# Patient Record
Sex: Female | Born: 1950
Health system: Southern US, Community
[De-identification: ages and names within clinical notes are randomized; demographics above are authoritative.]

## PROBLEM LIST (undated history)

## (undated) DIAGNOSIS — J449 Chronic obstructive pulmonary disease, unspecified: Secondary | ICD-10-CM

## (undated) DIAGNOSIS — I639 Cerebral infarction, unspecified: Secondary | ICD-10-CM

## (undated) DIAGNOSIS — M1990 Unspecified osteoarthritis, unspecified site: Secondary | ICD-10-CM

## (undated) DIAGNOSIS — C801 Malignant (primary) neoplasm, unspecified: Secondary | ICD-10-CM

## (undated) DIAGNOSIS — I729 Aneurysm of unspecified site: Secondary | ICD-10-CM

## (undated) DIAGNOSIS — E785 Hyperlipidemia, unspecified: Secondary | ICD-10-CM

## (undated) DIAGNOSIS — I10 Essential (primary) hypertension: Secondary | ICD-10-CM

## (undated) DIAGNOSIS — I509 Heart failure, unspecified: Secondary | ICD-10-CM

## (undated) DIAGNOSIS — F419 Anxiety disorder, unspecified: Secondary | ICD-10-CM

## (undated) DIAGNOSIS — E079 Disorder of thyroid, unspecified: Secondary | ICD-10-CM

## (undated) HISTORY — DX: Cerebral infarction, unspecified: I63.9

## (undated) HISTORY — PX: CHOLECYSTECTOMY: SHX55

## (undated) HISTORY — PX: AORTIC/RENAL BYPASS: SHX6299

## (undated) HISTORY — PX: OTHER SURGICAL HISTORY: SHX169

## (undated) HISTORY — DX: Anxiety disorder, unspecified: F41.9

## (undated) HISTORY — DX: Disorder of thyroid, unspecified: E07.9

## (undated) HISTORY — PX: CORONARY ARTERY BYPASS GRAFT: SHX141

## (undated) HISTORY — DX: Unspecified osteoarthritis, unspecified site: M19.90

## (undated) HISTORY — PX: ABDOMINAL HYSTERECTOMY: SHX81

## (undated) HISTORY — DX: Chronic obstructive pulmonary disease, unspecified: J44.9

## (undated) HISTORY — DX: Heart failure, unspecified: I50.9

## (undated) HISTORY — DX: Essential (primary) hypertension: I10

## (undated) HISTORY — DX: Aneurysm of unspecified site: I72.9

## (undated) HISTORY — DX: Hyperlipidemia, unspecified: E78.5

## (undated) HISTORY — DX: Malignant (primary) neoplasm, unspecified: C80.1

---

## 2004-06-19 ENCOUNTER — Emergency Department (HOSPITAL_COMMUNITY): Admission: EM | Admit: 2004-06-19 | Discharge: 2004-06-19 | Payer: Self-pay | Admitting: Emergency Medicine

## 2005-12-20 ENCOUNTER — Inpatient Hospital Stay (HOSPITAL_COMMUNITY): Admission: EM | Admit: 2005-12-20 | Discharge: 2006-01-21 | Payer: Self-pay | Admitting: Emergency Medicine

## 2005-12-20 ENCOUNTER — Ambulatory Visit: Payer: Self-pay | Admitting: Pulmonary Disease

## 2005-12-22 ENCOUNTER — Ambulatory Visit: Payer: Self-pay | Admitting: Infectious Diseases

## 2006-01-13 ENCOUNTER — Ambulatory Visit: Payer: Self-pay | Admitting: Physical Medicine & Rehabilitation

## 2006-01-21 ENCOUNTER — Inpatient Hospital Stay (HOSPITAL_COMMUNITY)
Admission: AD | Admit: 2006-01-21 | Discharge: 2006-02-08 | Payer: Self-pay | Admitting: Physical Medicine & Rehabilitation

## 2006-04-20 ENCOUNTER — Ambulatory Visit: Payer: Self-pay | Admitting: Physical Medicine & Rehabilitation

## 2006-04-20 ENCOUNTER — Encounter
Admission: RE | Admit: 2006-04-20 | Discharge: 2006-07-19 | Payer: Self-pay | Admitting: Physical Medicine & Rehabilitation

## 2006-05-31 ENCOUNTER — Ambulatory Visit: Payer: Self-pay | Admitting: Physical Medicine & Rehabilitation

## 2011-02-26 DIAGNOSIS — I6350 Cerebral infarction due to unspecified occlusion or stenosis of unspecified cerebral artery: Secondary | ICD-10-CM | POA: Diagnosis not present

## 2011-03-23 DIAGNOSIS — I6789 Other cerebrovascular disease: Secondary | ICD-10-CM | POA: Diagnosis not present

## 2011-03-23 DIAGNOSIS — R5383 Other fatigue: Secondary | ICD-10-CM | POA: Diagnosis not present

## 2011-03-23 DIAGNOSIS — J449 Chronic obstructive pulmonary disease, unspecified: Secondary | ICD-10-CM | POA: Diagnosis not present

## 2011-03-23 DIAGNOSIS — E7800 Pure hypercholesterolemia, unspecified: Secondary | ICD-10-CM | POA: Diagnosis not present

## 2011-03-23 DIAGNOSIS — I6350 Cerebral infarction due to unspecified occlusion or stenosis of unspecified cerebral artery: Secondary | ICD-10-CM | POA: Diagnosis not present

## 2011-03-23 DIAGNOSIS — E039 Hypothyroidism, unspecified: Secondary | ICD-10-CM | POA: Diagnosis not present

## 2011-03-23 DIAGNOSIS — R5381 Other malaise: Secondary | ICD-10-CM | POA: Diagnosis not present

## 2011-04-30 DIAGNOSIS — I6350 Cerebral infarction due to unspecified occlusion or stenosis of unspecified cerebral artery: Secondary | ICD-10-CM | POA: Diagnosis not present

## 2011-05-28 DIAGNOSIS — I6350 Cerebral infarction due to unspecified occlusion or stenosis of unspecified cerebral artery: Secondary | ICD-10-CM | POA: Diagnosis not present

## 2011-07-02 DIAGNOSIS — I6350 Cerebral infarction due to unspecified occlusion or stenosis of unspecified cerebral artery: Secondary | ICD-10-CM | POA: Diagnosis not present

## 2011-08-04 DIAGNOSIS — I6350 Cerebral infarction due to unspecified occlusion or stenosis of unspecified cerebral artery: Secondary | ICD-10-CM | POA: Diagnosis not present

## 2011-08-04 DIAGNOSIS — F17200 Nicotine dependence, unspecified, uncomplicated: Secondary | ICD-10-CM | POA: Diagnosis not present

## 2011-08-30 DIAGNOSIS — B351 Tinea unguium: Secondary | ICD-10-CM | POA: Diagnosis not present

## 2011-08-30 DIAGNOSIS — M79609 Pain in unspecified limb: Secondary | ICD-10-CM | POA: Diagnosis not present

## 2011-09-06 DIAGNOSIS — I2510 Atherosclerotic heart disease of native coronary artery without angina pectoris: Secondary | ICD-10-CM | POA: Diagnosis not present

## 2011-09-06 DIAGNOSIS — E559 Vitamin D deficiency, unspecified: Secondary | ICD-10-CM | POA: Diagnosis not present

## 2011-09-06 DIAGNOSIS — E7800 Pure hypercholesterolemia, unspecified: Secondary | ICD-10-CM | POA: Diagnosis not present

## 2011-09-06 DIAGNOSIS — R5381 Other malaise: Secondary | ICD-10-CM | POA: Diagnosis not present

## 2011-09-06 DIAGNOSIS — I749 Embolism and thrombosis of unspecified artery: Secondary | ICD-10-CM | POA: Diagnosis not present

## 2011-09-06 DIAGNOSIS — M810 Age-related osteoporosis without current pathological fracture: Secondary | ICD-10-CM | POA: Diagnosis not present

## 2011-09-06 DIAGNOSIS — E039 Hypothyroidism, unspecified: Secondary | ICD-10-CM | POA: Diagnosis not present

## 2011-09-06 DIAGNOSIS — R5383 Other fatigue: Secondary | ICD-10-CM | POA: Diagnosis not present

## 2011-10-20 DIAGNOSIS — J209 Acute bronchitis, unspecified: Secondary | ICD-10-CM | POA: Diagnosis not present

## 2011-10-27 DIAGNOSIS — J410 Simple chronic bronchitis: Secondary | ICD-10-CM | POA: Diagnosis not present

## 2011-10-27 DIAGNOSIS — I749 Embolism and thrombosis of unspecified artery: Secondary | ICD-10-CM | POA: Diagnosis not present

## 2011-10-27 DIAGNOSIS — Z0000 Encounter for general adult medical examination without abnormal findings: Secondary | ICD-10-CM | POA: Diagnosis not present

## 2011-10-28 DIAGNOSIS — I6789 Other cerebrovascular disease: Secondary | ICD-10-CM | POA: Diagnosis not present

## 2011-10-28 DIAGNOSIS — J449 Chronic obstructive pulmonary disease, unspecified: Secondary | ICD-10-CM | POA: Diagnosis not present

## 2011-10-29 DIAGNOSIS — I6789 Other cerebrovascular disease: Secondary | ICD-10-CM | POA: Diagnosis not present

## 2011-10-29 DIAGNOSIS — J449 Chronic obstructive pulmonary disease, unspecified: Secondary | ICD-10-CM | POA: Diagnosis not present

## 2011-11-02 DIAGNOSIS — I6789 Other cerebrovascular disease: Secondary | ICD-10-CM | POA: Diagnosis not present

## 2011-11-02 DIAGNOSIS — J449 Chronic obstructive pulmonary disease, unspecified: Secondary | ICD-10-CM | POA: Diagnosis not present

## 2011-11-03 DIAGNOSIS — J209 Acute bronchitis, unspecified: Secondary | ICD-10-CM | POA: Diagnosis not present

## 2011-11-08 DIAGNOSIS — I6789 Other cerebrovascular disease: Secondary | ICD-10-CM | POA: Diagnosis not present

## 2011-11-08 DIAGNOSIS — J449 Chronic obstructive pulmonary disease, unspecified: Secondary | ICD-10-CM | POA: Diagnosis not present

## 2011-11-12 DIAGNOSIS — J449 Chronic obstructive pulmonary disease, unspecified: Secondary | ICD-10-CM | POA: Diagnosis not present

## 2011-11-12 DIAGNOSIS — I6789 Other cerebrovascular disease: Secondary | ICD-10-CM | POA: Diagnosis not present

## 2011-11-15 DIAGNOSIS — J449 Chronic obstructive pulmonary disease, unspecified: Secondary | ICD-10-CM | POA: Diagnosis not present

## 2011-11-15 DIAGNOSIS — I6789 Other cerebrovascular disease: Secondary | ICD-10-CM | POA: Diagnosis not present

## 2011-11-18 DIAGNOSIS — J449 Chronic obstructive pulmonary disease, unspecified: Secondary | ICD-10-CM | POA: Diagnosis not present

## 2011-11-18 DIAGNOSIS — I6789 Other cerebrovascular disease: Secondary | ICD-10-CM | POA: Diagnosis not present

## 2011-11-23 DIAGNOSIS — J449 Chronic obstructive pulmonary disease, unspecified: Secondary | ICD-10-CM | POA: Diagnosis not present

## 2011-11-23 DIAGNOSIS — I6789 Other cerebrovascular disease: Secondary | ICD-10-CM | POA: Diagnosis not present

## 2011-11-24 DIAGNOSIS — I6789 Other cerebrovascular disease: Secondary | ICD-10-CM | POA: Diagnosis not present

## 2011-11-24 DIAGNOSIS — J449 Chronic obstructive pulmonary disease, unspecified: Secondary | ICD-10-CM | POA: Diagnosis not present

## 2011-11-24 DIAGNOSIS — J4489 Other specified chronic obstructive pulmonary disease: Secondary | ICD-10-CM | POA: Diagnosis not present

## 2011-11-24 DIAGNOSIS — Z23 Encounter for immunization: Secondary | ICD-10-CM | POA: Diagnosis not present

## 2011-11-24 DIAGNOSIS — I4891 Unspecified atrial fibrillation: Secondary | ICD-10-CM | POA: Diagnosis not present

## 2011-12-01 DIAGNOSIS — I6789 Other cerebrovascular disease: Secondary | ICD-10-CM | POA: Diagnosis not present

## 2011-12-01 DIAGNOSIS — J449 Chronic obstructive pulmonary disease, unspecified: Secondary | ICD-10-CM | POA: Diagnosis not present

## 2011-12-03 DIAGNOSIS — I6789 Other cerebrovascular disease: Secondary | ICD-10-CM | POA: Diagnosis not present

## 2011-12-03 DIAGNOSIS — J449 Chronic obstructive pulmonary disease, unspecified: Secondary | ICD-10-CM | POA: Diagnosis not present

## 2011-12-06 DIAGNOSIS — I6789 Other cerebrovascular disease: Secondary | ICD-10-CM | POA: Diagnosis not present

## 2011-12-06 DIAGNOSIS — J449 Chronic obstructive pulmonary disease, unspecified: Secondary | ICD-10-CM | POA: Diagnosis not present

## 2011-12-08 DIAGNOSIS — I6789 Other cerebrovascular disease: Secondary | ICD-10-CM | POA: Diagnosis not present

## 2011-12-08 DIAGNOSIS — J449 Chronic obstructive pulmonary disease, unspecified: Secondary | ICD-10-CM | POA: Diagnosis not present

## 2011-12-15 DIAGNOSIS — J449 Chronic obstructive pulmonary disease, unspecified: Secondary | ICD-10-CM | POA: Diagnosis not present

## 2011-12-15 DIAGNOSIS — I6789 Other cerebrovascular disease: Secondary | ICD-10-CM | POA: Diagnosis not present

## 2011-12-22 DIAGNOSIS — J449 Chronic obstructive pulmonary disease, unspecified: Secondary | ICD-10-CM | POA: Diagnosis not present

## 2011-12-22 DIAGNOSIS — I6789 Other cerebrovascular disease: Secondary | ICD-10-CM | POA: Diagnosis not present

## 2011-12-23 DIAGNOSIS — J449 Chronic obstructive pulmonary disease, unspecified: Secondary | ICD-10-CM | POA: Diagnosis not present

## 2011-12-23 DIAGNOSIS — I6789 Other cerebrovascular disease: Secondary | ICD-10-CM | POA: Diagnosis not present

## 2011-12-28 DIAGNOSIS — J449 Chronic obstructive pulmonary disease, unspecified: Secondary | ICD-10-CM | POA: Diagnosis not present

## 2011-12-28 DIAGNOSIS — I6789 Other cerebrovascular disease: Secondary | ICD-10-CM | POA: Diagnosis not present

## 2012-01-02 DIAGNOSIS — J449 Chronic obstructive pulmonary disease, unspecified: Secondary | ICD-10-CM | POA: Diagnosis not present

## 2012-01-05 DIAGNOSIS — J449 Chronic obstructive pulmonary disease, unspecified: Secondary | ICD-10-CM | POA: Diagnosis not present

## 2012-01-12 DIAGNOSIS — J449 Chronic obstructive pulmonary disease, unspecified: Secondary | ICD-10-CM | POA: Diagnosis not present

## 2012-01-14 DIAGNOSIS — J449 Chronic obstructive pulmonary disease, unspecified: Secondary | ICD-10-CM | POA: Diagnosis not present

## 2012-01-19 DIAGNOSIS — J449 Chronic obstructive pulmonary disease, unspecified: Secondary | ICD-10-CM | POA: Diagnosis not present

## 2012-02-02 DIAGNOSIS — J449 Chronic obstructive pulmonary disease, unspecified: Secondary | ICD-10-CM | POA: Diagnosis not present

## 2012-02-03 DIAGNOSIS — J449 Chronic obstructive pulmonary disease, unspecified: Secondary | ICD-10-CM | POA: Diagnosis not present

## 2012-02-08 DIAGNOSIS — J449 Chronic obstructive pulmonary disease, unspecified: Secondary | ICD-10-CM | POA: Diagnosis not present

## 2012-02-10 DIAGNOSIS — J449 Chronic obstructive pulmonary disease, unspecified: Secondary | ICD-10-CM | POA: Diagnosis not present

## 2012-02-17 DIAGNOSIS — J449 Chronic obstructive pulmonary disease, unspecified: Secondary | ICD-10-CM | POA: Diagnosis not present

## 2012-03-02 DIAGNOSIS — J449 Chronic obstructive pulmonary disease, unspecified: Secondary | ICD-10-CM | POA: Diagnosis not present

## 2012-03-03 DIAGNOSIS — J449 Chronic obstructive pulmonary disease, unspecified: Secondary | ICD-10-CM | POA: Diagnosis not present

## 2012-03-03 DIAGNOSIS — I6350 Cerebral infarction due to unspecified occlusion or stenosis of unspecified cerebral artery: Secondary | ICD-10-CM | POA: Diagnosis not present

## 2012-03-04 DIAGNOSIS — J449 Chronic obstructive pulmonary disease, unspecified: Secondary | ICD-10-CM | POA: Diagnosis not present

## 2012-03-08 DIAGNOSIS — J449 Chronic obstructive pulmonary disease, unspecified: Secondary | ICD-10-CM | POA: Diagnosis not present

## 2012-03-09 DIAGNOSIS — J449 Chronic obstructive pulmonary disease, unspecified: Secondary | ICD-10-CM | POA: Diagnosis not present

## 2012-03-10 DIAGNOSIS — J449 Chronic obstructive pulmonary disease, unspecified: Secondary | ICD-10-CM | POA: Diagnosis not present

## 2012-03-17 DIAGNOSIS — J449 Chronic obstructive pulmonary disease, unspecified: Secondary | ICD-10-CM | POA: Diagnosis not present

## 2012-03-22 DIAGNOSIS — J449 Chronic obstructive pulmonary disease, unspecified: Secondary | ICD-10-CM | POA: Diagnosis not present

## 2012-03-23 DIAGNOSIS — J449 Chronic obstructive pulmonary disease, unspecified: Secondary | ICD-10-CM | POA: Diagnosis not present

## 2012-03-27 DIAGNOSIS — J449 Chronic obstructive pulmonary disease, unspecified: Secondary | ICD-10-CM | POA: Diagnosis not present

## 2012-03-31 DIAGNOSIS — I6350 Cerebral infarction due to unspecified occlusion or stenosis of unspecified cerebral artery: Secondary | ICD-10-CM | POA: Diagnosis not present

## 2012-04-01 DIAGNOSIS — J449 Chronic obstructive pulmonary disease, unspecified: Secondary | ICD-10-CM | POA: Diagnosis not present

## 2012-04-10 DIAGNOSIS — J449 Chronic obstructive pulmonary disease, unspecified: Secondary | ICD-10-CM | POA: Diagnosis not present

## 2012-04-11 DIAGNOSIS — J449 Chronic obstructive pulmonary disease, unspecified: Secondary | ICD-10-CM | POA: Diagnosis not present

## 2012-04-17 DIAGNOSIS — J449 Chronic obstructive pulmonary disease, unspecified: Secondary | ICD-10-CM | POA: Diagnosis not present

## 2012-04-20 DIAGNOSIS — J449 Chronic obstructive pulmonary disease, unspecified: Secondary | ICD-10-CM | POA: Diagnosis not present

## 2012-04-21 DIAGNOSIS — J449 Chronic obstructive pulmonary disease, unspecified: Secondary | ICD-10-CM | POA: Diagnosis not present

## 2012-05-04 DIAGNOSIS — I6789 Other cerebrovascular disease: Secondary | ICD-10-CM | POA: Diagnosis not present

## 2012-05-26 DIAGNOSIS — I6350 Cerebral infarction due to unspecified occlusion or stenosis of unspecified cerebral artery: Secondary | ICD-10-CM | POA: Diagnosis not present

## 2012-07-10 DIAGNOSIS — E785 Hyperlipidemia, unspecified: Secondary | ICD-10-CM | POA: Diagnosis not present

## 2012-07-10 DIAGNOSIS — J449 Chronic obstructive pulmonary disease, unspecified: Secondary | ICD-10-CM | POA: Diagnosis not present

## 2012-07-10 DIAGNOSIS — R5383 Other fatigue: Secondary | ICD-10-CM | POA: Diagnosis not present

## 2012-07-10 DIAGNOSIS — F17200 Nicotine dependence, unspecified, uncomplicated: Secondary | ICD-10-CM | POA: Diagnosis not present

## 2012-07-10 DIAGNOSIS — I6529 Occlusion and stenosis of unspecified carotid artery: Secondary | ICD-10-CM | POA: Diagnosis not present

## 2012-07-10 DIAGNOSIS — I4891 Unspecified atrial fibrillation: Secondary | ICD-10-CM | POA: Diagnosis not present

## 2012-07-10 DIAGNOSIS — I6350 Cerebral infarction due to unspecified occlusion or stenosis of unspecified cerebral artery: Secondary | ICD-10-CM | POA: Diagnosis not present

## 2012-07-10 DIAGNOSIS — R5381 Other malaise: Secondary | ICD-10-CM | POA: Diagnosis not present

## 2012-07-10 DIAGNOSIS — E7800 Pure hypercholesterolemia, unspecified: Secondary | ICD-10-CM | POA: Diagnosis not present

## 2012-07-10 DIAGNOSIS — J4489 Other specified chronic obstructive pulmonary disease: Secondary | ICD-10-CM | POA: Diagnosis not present

## 2012-07-10 DIAGNOSIS — G8190 Hemiplegia, unspecified affecting unspecified side: Secondary | ICD-10-CM | POA: Diagnosis not present

## 2012-07-10 DIAGNOSIS — F329 Major depressive disorder, single episode, unspecified: Secondary | ICD-10-CM | POA: Diagnosis not present

## 2012-07-12 DIAGNOSIS — I4891 Unspecified atrial fibrillation: Secondary | ICD-10-CM | POA: Diagnosis not present

## 2012-09-29 DIAGNOSIS — I6789 Other cerebrovascular disease: Secondary | ICD-10-CM | POA: Diagnosis not present

## 2012-10-16 DIAGNOSIS — I6789 Other cerebrovascular disease: Secondary | ICD-10-CM | POA: Diagnosis not present

## 2012-10-16 DIAGNOSIS — R5381 Other malaise: Secondary | ICD-10-CM | POA: Diagnosis not present

## 2012-10-16 DIAGNOSIS — E7800 Pure hypercholesterolemia, unspecified: Secondary | ICD-10-CM | POA: Diagnosis not present

## 2012-10-16 DIAGNOSIS — E785 Hyperlipidemia, unspecified: Secondary | ICD-10-CM | POA: Diagnosis not present

## 2012-10-16 DIAGNOSIS — I6529 Occlusion and stenosis of unspecified carotid artery: Secondary | ICD-10-CM | POA: Diagnosis not present

## 2012-10-16 DIAGNOSIS — G8190 Hemiplegia, unspecified affecting unspecified side: Secondary | ICD-10-CM | POA: Diagnosis not present

## 2012-10-16 DIAGNOSIS — E559 Vitamin D deficiency, unspecified: Secondary | ICD-10-CM | POA: Diagnosis not present

## 2012-10-16 DIAGNOSIS — R5383 Other fatigue: Secondary | ICD-10-CM | POA: Diagnosis not present

## 2012-10-16 DIAGNOSIS — J449 Chronic obstructive pulmonary disease, unspecified: Secondary | ICD-10-CM | POA: Diagnosis not present

## 2012-10-16 DIAGNOSIS — F17200 Nicotine dependence, unspecified, uncomplicated: Secondary | ICD-10-CM | POA: Diagnosis not present

## 2012-11-15 DIAGNOSIS — Z23 Encounter for immunization: Secondary | ICD-10-CM | POA: Diagnosis not present

## 2012-12-01 DIAGNOSIS — I6789 Other cerebrovascular disease: Secondary | ICD-10-CM | POA: Diagnosis not present

## 2013-01-29 DIAGNOSIS — I6350 Cerebral infarction due to unspecified occlusion or stenosis of unspecified cerebral artery: Secondary | ICD-10-CM | POA: Diagnosis not present

## 2013-03-30 DIAGNOSIS — Z7901 Long term (current) use of anticoagulants: Secondary | ICD-10-CM | POA: Diagnosis not present

## 2013-04-27 DIAGNOSIS — I6350 Cerebral infarction due to unspecified occlusion or stenosis of unspecified cerebral artery: Secondary | ICD-10-CM | POA: Diagnosis not present

## 2013-06-04 ENCOUNTER — Ambulatory Visit: Payer: Self-pay | Admitting: Podiatry

## 2013-06-29 DIAGNOSIS — I4891 Unspecified atrial fibrillation: Secondary | ICD-10-CM | POA: Diagnosis not present

## 2013-06-29 DIAGNOSIS — M545 Low back pain, unspecified: Secondary | ICD-10-CM | POA: Diagnosis not present

## 2013-06-29 DIAGNOSIS — J449 Chronic obstructive pulmonary disease, unspecified: Secondary | ICD-10-CM | POA: Diagnosis not present

## 2013-06-29 DIAGNOSIS — F17200 Nicotine dependence, unspecified, uncomplicated: Secondary | ICD-10-CM | POA: Diagnosis not present

## 2013-06-29 DIAGNOSIS — E7800 Pure hypercholesterolemia, unspecified: Secondary | ICD-10-CM | POA: Diagnosis not present

## 2013-06-29 DIAGNOSIS — E785 Hyperlipidemia, unspecified: Secondary | ICD-10-CM | POA: Diagnosis not present

## 2013-06-29 DIAGNOSIS — E559 Vitamin D deficiency, unspecified: Secondary | ICD-10-CM | POA: Diagnosis not present

## 2013-06-29 DIAGNOSIS — G8190 Hemiplegia, unspecified affecting unspecified side: Secondary | ICD-10-CM | POA: Diagnosis not present

## 2013-06-29 DIAGNOSIS — R5381 Other malaise: Secondary | ICD-10-CM | POA: Diagnosis not present

## 2013-06-29 DIAGNOSIS — R5383 Other fatigue: Secondary | ICD-10-CM | POA: Diagnosis not present

## 2013-07-26 DIAGNOSIS — J449 Chronic obstructive pulmonary disease, unspecified: Secondary | ICD-10-CM | POA: Diagnosis not present

## 2013-07-26 DIAGNOSIS — M545 Low back pain, unspecified: Secondary | ICD-10-CM | POA: Diagnosis not present

## 2013-07-26 DIAGNOSIS — S99929A Unspecified injury of unspecified foot, initial encounter: Secondary | ICD-10-CM | POA: Diagnosis not present

## 2013-07-26 DIAGNOSIS — G8190 Hemiplegia, unspecified affecting unspecified side: Secondary | ICD-10-CM | POA: Diagnosis not present

## 2013-07-26 DIAGNOSIS — M25569 Pain in unspecified knee: Secondary | ICD-10-CM | POA: Diagnosis not present

## 2013-07-26 DIAGNOSIS — S99919A Unspecified injury of unspecified ankle, initial encounter: Secondary | ICD-10-CM | POA: Diagnosis not present

## 2013-07-26 DIAGNOSIS — F17200 Nicotine dependence, unspecified, uncomplicated: Secondary | ICD-10-CM | POA: Diagnosis not present

## 2013-07-26 DIAGNOSIS — S8990XA Unspecified injury of unspecified lower leg, initial encounter: Secondary | ICD-10-CM | POA: Diagnosis not present

## 2013-07-31 DIAGNOSIS — I2510 Atherosclerotic heart disease of native coronary artery without angina pectoris: Secondary | ICD-10-CM | POA: Diagnosis not present

## 2013-07-31 DIAGNOSIS — Z9181 History of falling: Secondary | ICD-10-CM | POA: Diagnosis not present

## 2013-07-31 DIAGNOSIS — M79609 Pain in unspecified limb: Secondary | ICD-10-CM | POA: Diagnosis not present

## 2013-07-31 DIAGNOSIS — M549 Dorsalgia, unspecified: Secondary | ICD-10-CM | POA: Diagnosis not present

## 2013-07-31 DIAGNOSIS — I69959 Hemiplegia and hemiparesis following unspecified cerebrovascular disease affecting unspecified side: Secondary | ICD-10-CM | POA: Diagnosis not present

## 2013-07-31 DIAGNOSIS — J449 Chronic obstructive pulmonary disease, unspecified: Secondary | ICD-10-CM | POA: Diagnosis not present

## 2013-07-31 DIAGNOSIS — G8911 Acute pain due to trauma: Secondary | ICD-10-CM | POA: Diagnosis not present

## 2013-07-31 DIAGNOSIS — Z7901 Long term (current) use of anticoagulants: Secondary | ICD-10-CM | POA: Diagnosis not present

## 2013-07-31 DIAGNOSIS — Z5181 Encounter for therapeutic drug level monitoring: Secondary | ICD-10-CM | POA: Diagnosis not present

## 2013-08-01 ENCOUNTER — Emergency Department: Payer: Self-pay | Admitting: Emergency Medicine

## 2013-08-01 DIAGNOSIS — Z79899 Other long term (current) drug therapy: Secondary | ICD-10-CM | POA: Diagnosis not present

## 2013-08-01 DIAGNOSIS — Z951 Presence of aortocoronary bypass graft: Secondary | ICD-10-CM | POA: Diagnosis not present

## 2013-08-01 DIAGNOSIS — I252 Old myocardial infarction: Secondary | ICD-10-CM | POA: Diagnosis not present

## 2013-08-01 DIAGNOSIS — J45909 Unspecified asthma, uncomplicated: Secondary | ICD-10-CM | POA: Diagnosis not present

## 2013-08-01 DIAGNOSIS — M25569 Pain in unspecified knee: Secondary | ICD-10-CM | POA: Diagnosis not present

## 2013-08-01 DIAGNOSIS — M545 Low back pain, unspecified: Secondary | ICD-10-CM | POA: Diagnosis not present

## 2013-08-01 DIAGNOSIS — Z8673 Personal history of transient ischemic attack (TIA), and cerebral infarction without residual deficits: Secondary | ICD-10-CM | POA: Diagnosis not present

## 2013-08-01 DIAGNOSIS — S99929A Unspecified injury of unspecified foot, initial encounter: Secondary | ICD-10-CM | POA: Diagnosis not present

## 2013-08-01 DIAGNOSIS — S8990XA Unspecified injury of unspecified lower leg, initial encounter: Secondary | ICD-10-CM | POA: Diagnosis not present

## 2013-08-01 DIAGNOSIS — I2510 Atherosclerotic heart disease of native coronary artery without angina pectoris: Secondary | ICD-10-CM | POA: Diagnosis not present

## 2013-08-01 DIAGNOSIS — I10 Essential (primary) hypertension: Secondary | ICD-10-CM | POA: Diagnosis not present

## 2013-08-01 LAB — PROTIME-INR
INR: 2.6
Prothrombin Time: 26.8 secs — ABNORMAL HIGH (ref 11.5–14.7)

## 2013-08-06 DIAGNOSIS — G8911 Acute pain due to trauma: Secondary | ICD-10-CM | POA: Diagnosis not present

## 2013-08-06 DIAGNOSIS — M549 Dorsalgia, unspecified: Secondary | ICD-10-CM | POA: Diagnosis not present

## 2013-08-06 DIAGNOSIS — I69959 Hemiplegia and hemiparesis following unspecified cerebrovascular disease affecting unspecified side: Secondary | ICD-10-CM | POA: Diagnosis not present

## 2013-08-06 DIAGNOSIS — M79609 Pain in unspecified limb: Secondary | ICD-10-CM | POA: Diagnosis not present

## 2013-08-06 DIAGNOSIS — J449 Chronic obstructive pulmonary disease, unspecified: Secondary | ICD-10-CM | POA: Diagnosis not present

## 2013-08-06 DIAGNOSIS — I2510 Atherosclerotic heart disease of native coronary artery without angina pectoris: Secondary | ICD-10-CM | POA: Diagnosis not present

## 2013-08-07 DIAGNOSIS — J449 Chronic obstructive pulmonary disease, unspecified: Secondary | ICD-10-CM | POA: Diagnosis not present

## 2013-08-07 DIAGNOSIS — M549 Dorsalgia, unspecified: Secondary | ICD-10-CM | POA: Diagnosis not present

## 2013-08-07 DIAGNOSIS — I2510 Atherosclerotic heart disease of native coronary artery without angina pectoris: Secondary | ICD-10-CM | POA: Diagnosis not present

## 2013-08-07 DIAGNOSIS — M79609 Pain in unspecified limb: Secondary | ICD-10-CM | POA: Diagnosis not present

## 2013-08-07 DIAGNOSIS — G8911 Acute pain due to trauma: Secondary | ICD-10-CM | POA: Diagnosis not present

## 2013-08-07 DIAGNOSIS — I69959 Hemiplegia and hemiparesis following unspecified cerebrovascular disease affecting unspecified side: Secondary | ICD-10-CM | POA: Diagnosis not present

## 2013-08-09 DIAGNOSIS — M79609 Pain in unspecified limb: Secondary | ICD-10-CM | POA: Diagnosis not present

## 2013-08-09 DIAGNOSIS — I69959 Hemiplegia and hemiparesis following unspecified cerebrovascular disease affecting unspecified side: Secondary | ICD-10-CM | POA: Diagnosis not present

## 2013-08-09 DIAGNOSIS — M549 Dorsalgia, unspecified: Secondary | ICD-10-CM | POA: Diagnosis not present

## 2013-08-09 DIAGNOSIS — I2510 Atherosclerotic heart disease of native coronary artery without angina pectoris: Secondary | ICD-10-CM | POA: Diagnosis not present

## 2013-08-09 DIAGNOSIS — G8911 Acute pain due to trauma: Secondary | ICD-10-CM | POA: Diagnosis not present

## 2013-08-09 DIAGNOSIS — J449 Chronic obstructive pulmonary disease, unspecified: Secondary | ICD-10-CM | POA: Diagnosis not present

## 2013-08-13 DIAGNOSIS — G8911 Acute pain due to trauma: Secondary | ICD-10-CM | POA: Diagnosis not present

## 2013-08-13 DIAGNOSIS — I2510 Atherosclerotic heart disease of native coronary artery without angina pectoris: Secondary | ICD-10-CM | POA: Diagnosis not present

## 2013-08-13 DIAGNOSIS — M549 Dorsalgia, unspecified: Secondary | ICD-10-CM | POA: Diagnosis not present

## 2013-08-13 DIAGNOSIS — J449 Chronic obstructive pulmonary disease, unspecified: Secondary | ICD-10-CM | POA: Diagnosis not present

## 2013-08-13 DIAGNOSIS — M79609 Pain in unspecified limb: Secondary | ICD-10-CM | POA: Diagnosis not present

## 2013-08-13 DIAGNOSIS — I69959 Hemiplegia and hemiparesis following unspecified cerebrovascular disease affecting unspecified side: Secondary | ICD-10-CM | POA: Diagnosis not present

## 2013-08-16 DIAGNOSIS — G8911 Acute pain due to trauma: Secondary | ICD-10-CM | POA: Diagnosis not present

## 2013-08-16 DIAGNOSIS — I69959 Hemiplegia and hemiparesis following unspecified cerebrovascular disease affecting unspecified side: Secondary | ICD-10-CM | POA: Diagnosis not present

## 2013-08-16 DIAGNOSIS — I2510 Atherosclerotic heart disease of native coronary artery without angina pectoris: Secondary | ICD-10-CM | POA: Diagnosis not present

## 2013-08-16 DIAGNOSIS — M79609 Pain in unspecified limb: Secondary | ICD-10-CM | POA: Diagnosis not present

## 2013-08-16 DIAGNOSIS — J449 Chronic obstructive pulmonary disease, unspecified: Secondary | ICD-10-CM | POA: Diagnosis not present

## 2013-08-16 DIAGNOSIS — M549 Dorsalgia, unspecified: Secondary | ICD-10-CM | POA: Diagnosis not present

## 2013-08-17 DIAGNOSIS — I499 Cardiac arrhythmia, unspecified: Secondary | ICD-10-CM | POA: Diagnosis not present

## 2013-08-17 DIAGNOSIS — E782 Mixed hyperlipidemia: Secondary | ICD-10-CM | POA: Diagnosis not present

## 2013-08-17 DIAGNOSIS — M545 Low back pain, unspecified: Secondary | ICD-10-CM | POA: Diagnosis not present

## 2013-08-17 DIAGNOSIS — M25569 Pain in unspecified knee: Secondary | ICD-10-CM | POA: Diagnosis not present

## 2013-08-17 DIAGNOSIS — R262 Difficulty in walking, not elsewhere classified: Secondary | ICD-10-CM | POA: Diagnosis not present

## 2013-08-17 DIAGNOSIS — Q762 Congenital spondylolisthesis: Secondary | ICD-10-CM | POA: Diagnosis not present

## 2013-08-17 DIAGNOSIS — W010XXA Fall on same level from slipping, tripping and stumbling without subsequent striking against object, initial encounter: Secondary | ICD-10-CM | POA: Diagnosis not present

## 2013-08-17 DIAGNOSIS — E039 Hypothyroidism, unspecified: Secondary | ICD-10-CM | POA: Diagnosis not present

## 2013-08-20 DIAGNOSIS — M79609 Pain in unspecified limb: Secondary | ICD-10-CM | POA: Diagnosis not present

## 2013-08-20 DIAGNOSIS — J449 Chronic obstructive pulmonary disease, unspecified: Secondary | ICD-10-CM | POA: Diagnosis not present

## 2013-08-20 DIAGNOSIS — G8911 Acute pain due to trauma: Secondary | ICD-10-CM | POA: Diagnosis not present

## 2013-08-20 DIAGNOSIS — I69959 Hemiplegia and hemiparesis following unspecified cerebrovascular disease affecting unspecified side: Secondary | ICD-10-CM | POA: Diagnosis not present

## 2013-08-20 DIAGNOSIS — I2510 Atherosclerotic heart disease of native coronary artery without angina pectoris: Secondary | ICD-10-CM | POA: Diagnosis not present

## 2013-08-20 DIAGNOSIS — M549 Dorsalgia, unspecified: Secondary | ICD-10-CM | POA: Diagnosis not present

## 2013-08-24 DIAGNOSIS — J449 Chronic obstructive pulmonary disease, unspecified: Secondary | ICD-10-CM | POA: Diagnosis not present

## 2013-08-24 DIAGNOSIS — M549 Dorsalgia, unspecified: Secondary | ICD-10-CM | POA: Diagnosis not present

## 2013-08-24 DIAGNOSIS — I2510 Atherosclerotic heart disease of native coronary artery without angina pectoris: Secondary | ICD-10-CM | POA: Diagnosis not present

## 2013-08-24 DIAGNOSIS — I69959 Hemiplegia and hemiparesis following unspecified cerebrovascular disease affecting unspecified side: Secondary | ICD-10-CM | POA: Diagnosis not present

## 2013-08-24 DIAGNOSIS — M79609 Pain in unspecified limb: Secondary | ICD-10-CM | POA: Diagnosis not present

## 2013-08-24 DIAGNOSIS — G8911 Acute pain due to trauma: Secondary | ICD-10-CM | POA: Diagnosis not present

## 2013-08-25 DIAGNOSIS — M549 Dorsalgia, unspecified: Secondary | ICD-10-CM | POA: Diagnosis not present

## 2013-08-25 DIAGNOSIS — G8911 Acute pain due to trauma: Secondary | ICD-10-CM | POA: Diagnosis not present

## 2013-08-25 DIAGNOSIS — I2510 Atherosclerotic heart disease of native coronary artery without angina pectoris: Secondary | ICD-10-CM | POA: Diagnosis not present

## 2013-08-25 DIAGNOSIS — I69959 Hemiplegia and hemiparesis following unspecified cerebrovascular disease affecting unspecified side: Secondary | ICD-10-CM | POA: Diagnosis not present

## 2013-08-25 DIAGNOSIS — M79609 Pain in unspecified limb: Secondary | ICD-10-CM | POA: Diagnosis not present

## 2013-08-25 DIAGNOSIS — J449 Chronic obstructive pulmonary disease, unspecified: Secondary | ICD-10-CM | POA: Diagnosis not present

## 2013-08-28 DIAGNOSIS — J449 Chronic obstructive pulmonary disease, unspecified: Secondary | ICD-10-CM | POA: Diagnosis not present

## 2013-08-28 DIAGNOSIS — M549 Dorsalgia, unspecified: Secondary | ICD-10-CM | POA: Diagnosis not present

## 2013-08-28 DIAGNOSIS — I69959 Hemiplegia and hemiparesis following unspecified cerebrovascular disease affecting unspecified side: Secondary | ICD-10-CM | POA: Diagnosis not present

## 2013-08-28 DIAGNOSIS — I2510 Atherosclerotic heart disease of native coronary artery without angina pectoris: Secondary | ICD-10-CM | POA: Diagnosis not present

## 2013-08-28 DIAGNOSIS — M79609 Pain in unspecified limb: Secondary | ICD-10-CM | POA: Diagnosis not present

## 2013-08-28 DIAGNOSIS — G8911 Acute pain due to trauma: Secondary | ICD-10-CM | POA: Diagnosis not present

## 2013-08-31 DIAGNOSIS — I2510 Atherosclerotic heart disease of native coronary artery without angina pectoris: Secondary | ICD-10-CM | POA: Diagnosis not present

## 2013-08-31 DIAGNOSIS — M549 Dorsalgia, unspecified: Secondary | ICD-10-CM | POA: Diagnosis not present

## 2013-08-31 DIAGNOSIS — I69959 Hemiplegia and hemiparesis following unspecified cerebrovascular disease affecting unspecified side: Secondary | ICD-10-CM | POA: Diagnosis not present

## 2013-08-31 DIAGNOSIS — M79609 Pain in unspecified limb: Secondary | ICD-10-CM | POA: Diagnosis not present

## 2013-08-31 DIAGNOSIS — J449 Chronic obstructive pulmonary disease, unspecified: Secondary | ICD-10-CM | POA: Diagnosis not present

## 2013-08-31 DIAGNOSIS — G8911 Acute pain due to trauma: Secondary | ICD-10-CM | POA: Diagnosis not present

## 2013-09-01 DIAGNOSIS — I69959 Hemiplegia and hemiparesis following unspecified cerebrovascular disease affecting unspecified side: Secondary | ICD-10-CM | POA: Diagnosis not present

## 2013-09-01 DIAGNOSIS — G8911 Acute pain due to trauma: Secondary | ICD-10-CM | POA: Diagnosis not present

## 2013-09-01 DIAGNOSIS — I2510 Atherosclerotic heart disease of native coronary artery without angina pectoris: Secondary | ICD-10-CM | POA: Diagnosis not present

## 2013-09-01 DIAGNOSIS — M79609 Pain in unspecified limb: Secondary | ICD-10-CM | POA: Diagnosis not present

## 2013-09-01 DIAGNOSIS — J449 Chronic obstructive pulmonary disease, unspecified: Secondary | ICD-10-CM | POA: Diagnosis not present

## 2013-09-01 DIAGNOSIS — M549 Dorsalgia, unspecified: Secondary | ICD-10-CM | POA: Diagnosis not present

## 2013-09-04 ENCOUNTER — Ambulatory Visit: Payer: Self-pay | Admitting: Internal Medicine

## 2013-09-04 DIAGNOSIS — M79609 Pain in unspecified limb: Secondary | ICD-10-CM | POA: Diagnosis not present

## 2013-09-04 DIAGNOSIS — I2510 Atherosclerotic heart disease of native coronary artery without angina pectoris: Secondary | ICD-10-CM | POA: Diagnosis not present

## 2013-09-04 DIAGNOSIS — M549 Dorsalgia, unspecified: Secondary | ICD-10-CM | POA: Diagnosis not present

## 2013-09-04 DIAGNOSIS — J449 Chronic obstructive pulmonary disease, unspecified: Secondary | ICD-10-CM | POA: Diagnosis not present

## 2013-09-04 DIAGNOSIS — IMO0002 Reserved for concepts with insufficient information to code with codable children: Secondary | ICD-10-CM | POA: Diagnosis not present

## 2013-09-04 DIAGNOSIS — S22009A Unspecified fracture of unspecified thoracic vertebra, initial encounter for closed fracture: Secondary | ICD-10-CM | POA: Diagnosis not present

## 2013-09-04 DIAGNOSIS — G8911 Acute pain due to trauma: Secondary | ICD-10-CM | POA: Diagnosis not present

## 2013-09-04 DIAGNOSIS — I69959 Hemiplegia and hemiparesis following unspecified cerebrovascular disease affecting unspecified side: Secondary | ICD-10-CM | POA: Diagnosis not present

## 2013-09-04 DIAGNOSIS — M545 Low back pain, unspecified: Secondary | ICD-10-CM | POA: Diagnosis not present

## 2013-09-06 DIAGNOSIS — I2510 Atherosclerotic heart disease of native coronary artery without angina pectoris: Secondary | ICD-10-CM | POA: Diagnosis not present

## 2013-09-06 DIAGNOSIS — G8911 Acute pain due to trauma: Secondary | ICD-10-CM | POA: Diagnosis not present

## 2013-09-06 DIAGNOSIS — I69959 Hemiplegia and hemiparesis following unspecified cerebrovascular disease affecting unspecified side: Secondary | ICD-10-CM | POA: Diagnosis not present

## 2013-09-06 DIAGNOSIS — J449 Chronic obstructive pulmonary disease, unspecified: Secondary | ICD-10-CM | POA: Diagnosis not present

## 2013-09-06 DIAGNOSIS — M79609 Pain in unspecified limb: Secondary | ICD-10-CM | POA: Diagnosis not present

## 2013-09-06 DIAGNOSIS — M549 Dorsalgia, unspecified: Secondary | ICD-10-CM | POA: Diagnosis not present

## 2013-09-11 DIAGNOSIS — Z7901 Long term (current) use of anticoagulants: Secondary | ICD-10-CM | POA: Diagnosis not present

## 2013-09-11 DIAGNOSIS — M545 Low back pain, unspecified: Secondary | ICD-10-CM | POA: Diagnosis not present

## 2013-09-11 DIAGNOSIS — I499 Cardiac arrhythmia, unspecified: Secondary | ICD-10-CM | POA: Diagnosis not present

## 2013-09-11 DIAGNOSIS — IMO0002 Reserved for concepts with insufficient information to code with codable children: Secondary | ICD-10-CM | POA: Diagnosis not present

## 2013-09-11 DIAGNOSIS — Q762 Congenital spondylolisthesis: Secondary | ICD-10-CM | POA: Diagnosis not present

## 2013-09-11 DIAGNOSIS — R262 Difficulty in walking, not elsewhere classified: Secondary | ICD-10-CM | POA: Diagnosis not present

## 2013-09-11 DIAGNOSIS — M25569 Pain in unspecified knee: Secondary | ICD-10-CM | POA: Diagnosis not present

## 2013-09-12 ENCOUNTER — Ambulatory Visit (INDEPENDENT_AMBULATORY_CARE_PROVIDER_SITE_OTHER): Payer: Medicare Other | Admitting: Podiatry

## 2013-09-12 ENCOUNTER — Encounter: Payer: Self-pay | Admitting: Podiatry

## 2013-09-12 VITALS — Ht <= 58 in | Wt 105.0 lb

## 2013-09-12 DIAGNOSIS — M79609 Pain in unspecified limb: Secondary | ICD-10-CM

## 2013-09-12 DIAGNOSIS — B351 Tinea unguium: Secondary | ICD-10-CM | POA: Diagnosis not present

## 2013-09-12 DIAGNOSIS — M79676 Pain in unspecified toe(s): Secondary | ICD-10-CM

## 2013-09-12 NOTE — Progress Notes (Signed)
She presents today with a chief complaint of painful elongated toenails. ° °Objective: Vital signs are stable alert and oriented x3. Pulses are palpable bilateral. Nails are thick yellow dystrophic onychomycotic painful palpation as well as debridement. ° °Assessment: Pain in limb second onychomycosis 1 through 5 bilateral. ° °Plan: Debridement of nails 1 through 5 bilateral covered service secondary to pain. °

## 2013-09-13 DIAGNOSIS — I2510 Atherosclerotic heart disease of native coronary artery without angina pectoris: Secondary | ICD-10-CM | POA: Diagnosis not present

## 2013-09-13 DIAGNOSIS — M79609 Pain in unspecified limb: Secondary | ICD-10-CM | POA: Diagnosis not present

## 2013-09-13 DIAGNOSIS — J449 Chronic obstructive pulmonary disease, unspecified: Secondary | ICD-10-CM | POA: Diagnosis not present

## 2013-09-13 DIAGNOSIS — G8911 Acute pain due to trauma: Secondary | ICD-10-CM | POA: Diagnosis not present

## 2013-09-13 DIAGNOSIS — M549 Dorsalgia, unspecified: Secondary | ICD-10-CM | POA: Diagnosis not present

## 2013-09-13 DIAGNOSIS — I69959 Hemiplegia and hemiparesis following unspecified cerebrovascular disease affecting unspecified side: Secondary | ICD-10-CM | POA: Diagnosis not present

## 2013-09-15 DIAGNOSIS — M79609 Pain in unspecified limb: Secondary | ICD-10-CM | POA: Diagnosis not present

## 2013-09-15 DIAGNOSIS — G8911 Acute pain due to trauma: Secondary | ICD-10-CM | POA: Diagnosis not present

## 2013-09-15 DIAGNOSIS — M549 Dorsalgia, unspecified: Secondary | ICD-10-CM | POA: Diagnosis not present

## 2013-09-15 DIAGNOSIS — J449 Chronic obstructive pulmonary disease, unspecified: Secondary | ICD-10-CM | POA: Diagnosis not present

## 2013-09-15 DIAGNOSIS — I69959 Hemiplegia and hemiparesis following unspecified cerebrovascular disease affecting unspecified side: Secondary | ICD-10-CM | POA: Diagnosis not present

## 2013-09-15 DIAGNOSIS — I2510 Atherosclerotic heart disease of native coronary artery without angina pectoris: Secondary | ICD-10-CM | POA: Diagnosis not present

## 2013-09-17 DIAGNOSIS — R030 Elevated blood-pressure reading, without diagnosis of hypertension: Secondary | ICD-10-CM | POA: Diagnosis not present

## 2013-09-17 DIAGNOSIS — T148XXA Other injury of unspecified body region, initial encounter: Secondary | ICD-10-CM | POA: Diagnosis not present

## 2013-09-18 DIAGNOSIS — I2510 Atherosclerotic heart disease of native coronary artery without angina pectoris: Secondary | ICD-10-CM | POA: Diagnosis not present

## 2013-09-18 DIAGNOSIS — J449 Chronic obstructive pulmonary disease, unspecified: Secondary | ICD-10-CM | POA: Diagnosis not present

## 2013-09-18 DIAGNOSIS — G8911 Acute pain due to trauma: Secondary | ICD-10-CM | POA: Diagnosis not present

## 2013-09-18 DIAGNOSIS — I69959 Hemiplegia and hemiparesis following unspecified cerebrovascular disease affecting unspecified side: Secondary | ICD-10-CM | POA: Diagnosis not present

## 2013-09-18 DIAGNOSIS — M79609 Pain in unspecified limb: Secondary | ICD-10-CM | POA: Diagnosis not present

## 2013-09-18 DIAGNOSIS — M549 Dorsalgia, unspecified: Secondary | ICD-10-CM | POA: Diagnosis not present

## 2013-09-21 DIAGNOSIS — G8911 Acute pain due to trauma: Secondary | ICD-10-CM | POA: Diagnosis not present

## 2013-09-21 DIAGNOSIS — M79609 Pain in unspecified limb: Secondary | ICD-10-CM | POA: Diagnosis not present

## 2013-09-21 DIAGNOSIS — I69959 Hemiplegia and hemiparesis following unspecified cerebrovascular disease affecting unspecified side: Secondary | ICD-10-CM | POA: Diagnosis not present

## 2013-09-21 DIAGNOSIS — M549 Dorsalgia, unspecified: Secondary | ICD-10-CM | POA: Diagnosis not present

## 2013-09-21 DIAGNOSIS — J449 Chronic obstructive pulmonary disease, unspecified: Secondary | ICD-10-CM | POA: Diagnosis not present

## 2013-09-21 DIAGNOSIS — I2510 Atherosclerotic heart disease of native coronary artery without angina pectoris: Secondary | ICD-10-CM | POA: Diagnosis not present

## 2013-09-25 ENCOUNTER — Ambulatory Visit: Payer: Self-pay | Admitting: Internal Medicine

## 2013-09-25 DIAGNOSIS — S83289A Other tear of lateral meniscus, current injury, unspecified knee, initial encounter: Secondary | ICD-10-CM | POA: Diagnosis not present

## 2013-09-25 DIAGNOSIS — M25569 Pain in unspecified knee: Secondary | ICD-10-CM | POA: Diagnosis not present

## 2013-09-25 DIAGNOSIS — S72413A Displaced unspecified condyle fracture of lower end of unspecified femur, initial encounter for closed fracture: Secondary | ICD-10-CM | POA: Diagnosis not present

## 2013-09-26 DIAGNOSIS — S72413A Displaced unspecified condyle fracture of lower end of unspecified femur, initial encounter for closed fracture: Secondary | ICD-10-CM | POA: Diagnosis not present

## 2013-09-26 DIAGNOSIS — M25569 Pain in unspecified knee: Secondary | ICD-10-CM | POA: Diagnosis not present

## 2013-09-26 DIAGNOSIS — M1710 Unilateral primary osteoarthritis, unspecified knee: Secondary | ICD-10-CM | POA: Diagnosis not present

## 2013-10-16 DIAGNOSIS — M545 Low back pain, unspecified: Secondary | ICD-10-CM | POA: Diagnosis not present

## 2013-10-16 DIAGNOSIS — R262 Difficulty in walking, not elsewhere classified: Secondary | ICD-10-CM | POA: Diagnosis not present

## 2013-10-16 DIAGNOSIS — IMO0001 Reserved for inherently not codable concepts without codable children: Secondary | ICD-10-CM | POA: Diagnosis not present

## 2013-10-16 DIAGNOSIS — Z7901 Long term (current) use of anticoagulants: Secondary | ICD-10-CM | POA: Diagnosis not present

## 2013-10-16 DIAGNOSIS — M25569 Pain in unspecified knee: Secondary | ICD-10-CM | POA: Diagnosis not present

## 2013-10-16 DIAGNOSIS — IMO0002 Reserved for concepts with insufficient information to code with codable children: Secondary | ICD-10-CM | POA: Diagnosis not present

## 2013-10-16 DIAGNOSIS — I499 Cardiac arrhythmia, unspecified: Secondary | ICD-10-CM | POA: Diagnosis not present

## 2013-10-16 DIAGNOSIS — F17200 Nicotine dependence, unspecified, uncomplicated: Secondary | ICD-10-CM | POA: Diagnosis not present

## 2013-11-15 DIAGNOSIS — M1712 Unilateral primary osteoarthritis, left knee: Secondary | ICD-10-CM | POA: Diagnosis not present

## 2013-11-16 DIAGNOSIS — I499 Cardiac arrhythmia, unspecified: Secondary | ICD-10-CM | POA: Diagnosis not present

## 2013-11-16 DIAGNOSIS — Z5181 Encounter for therapeutic drug level monitoring: Secondary | ICD-10-CM | POA: Diagnosis not present

## 2013-11-16 DIAGNOSIS — Z7901 Long term (current) use of anticoagulants: Secondary | ICD-10-CM | POA: Diagnosis not present

## 2013-12-21 DIAGNOSIS — Z86718 Personal history of other venous thrombosis and embolism: Secondary | ICD-10-CM | POA: Diagnosis not present

## 2013-12-21 DIAGNOSIS — Z7901 Long term (current) use of anticoagulants: Secondary | ICD-10-CM | POA: Diagnosis not present

## 2014-01-09 ENCOUNTER — Ambulatory Visit: Payer: Medicare Other | Admitting: Podiatry

## 2014-01-14 ENCOUNTER — Other Ambulatory Visit: Payer: Medicare Other

## 2014-01-18 ENCOUNTER — Other Ambulatory Visit: Payer: Medicare Other

## 2014-01-18 DIAGNOSIS — Z0001 Encounter for general adult medical examination with abnormal findings: Secondary | ICD-10-CM | POA: Diagnosis not present

## 2014-01-18 DIAGNOSIS — F17210 Nicotine dependence, cigarettes, uncomplicated: Secondary | ICD-10-CM | POA: Diagnosis not present

## 2014-01-18 DIAGNOSIS — E559 Vitamin D deficiency, unspecified: Secondary | ICD-10-CM | POA: Diagnosis not present

## 2014-01-18 DIAGNOSIS — Z5181 Encounter for therapeutic drug level monitoring: Secondary | ICD-10-CM | POA: Diagnosis not present

## 2014-01-18 DIAGNOSIS — I499 Cardiac arrhythmia, unspecified: Secondary | ICD-10-CM | POA: Diagnosis not present

## 2014-01-18 DIAGNOSIS — M25562 Pain in left knee: Secondary | ICD-10-CM | POA: Diagnosis not present

## 2014-01-18 DIAGNOSIS — E782 Mixed hyperlipidemia: Secondary | ICD-10-CM | POA: Diagnosis not present

## 2014-01-18 DIAGNOSIS — M5440 Lumbago with sciatica, unspecified side: Secondary | ICD-10-CM | POA: Diagnosis not present

## 2014-01-18 DIAGNOSIS — E039 Hypothyroidism, unspecified: Secondary | ICD-10-CM | POA: Diagnosis not present

## 2014-02-15 ENCOUNTER — Ambulatory Visit (INDEPENDENT_AMBULATORY_CARE_PROVIDER_SITE_OTHER): Payer: Medicare Other | Admitting: Podiatry

## 2014-02-15 DIAGNOSIS — B351 Tinea unguium: Secondary | ICD-10-CM

## 2014-02-15 DIAGNOSIS — M79676 Pain in unspecified toe(s): Secondary | ICD-10-CM

## 2014-02-15 NOTE — Progress Notes (Signed)
She presents today with a chief complaint of painful elongated toenails.  Objective: Vital signs are stable alert and oriented x3. Pulses are palpable bilateral. Nails are thick yellow dystrophic onychomycotic painful palpation as well as debridement.  Assessment: Pain in limb second onychomycosis 1 through 5 bilateral.  Plan: Debridement of nails 1 through 5 bilateral covered service secondary to pain.

## 2014-03-25 DIAGNOSIS — Z7901 Long term (current) use of anticoagulants: Secondary | ICD-10-CM | POA: Diagnosis not present

## 2014-03-25 DIAGNOSIS — Z86718 Personal history of other venous thrombosis and embolism: Secondary | ICD-10-CM | POA: Diagnosis not present

## 2014-05-17 ENCOUNTER — Ambulatory Visit: Payer: Medicare Other

## 2014-05-23 ENCOUNTER — Ambulatory Visit: Payer: Medicare Other | Admitting: Podiatry

## 2014-05-24 DIAGNOSIS — R030 Elevated blood-pressure reading, without diagnosis of hypertension: Secondary | ICD-10-CM | POA: Diagnosis not present

## 2014-05-24 DIAGNOSIS — M5440 Lumbago with sciatica, unspecified side: Secondary | ICD-10-CM | POA: Diagnosis not present

## 2014-05-24 DIAGNOSIS — M5135 Other intervertebral disc degeneration, thoracolumbar region: Secondary | ICD-10-CM | POA: Diagnosis not present

## 2014-05-24 DIAGNOSIS — Z86718 Personal history of other venous thrombosis and embolism: Secondary | ICD-10-CM | POA: Diagnosis not present

## 2014-05-24 DIAGNOSIS — Z7901 Long term (current) use of anticoagulants: Secondary | ICD-10-CM | POA: Diagnosis not present

## 2014-06-04 ENCOUNTER — Ambulatory Visit: Payer: Medicare Other

## 2014-06-11 ENCOUNTER — Ambulatory Visit (INDEPENDENT_AMBULATORY_CARE_PROVIDER_SITE_OTHER): Payer: Medicare Other | Admitting: Podiatry

## 2014-06-11 DIAGNOSIS — B351 Tinea unguium: Secondary | ICD-10-CM | POA: Diagnosis not present

## 2014-06-11 DIAGNOSIS — M79676 Pain in unspecified toe(s): Secondary | ICD-10-CM | POA: Diagnosis not present

## 2014-06-11 NOTE — Progress Notes (Signed)
Subjective: 64 y.o.-year-old female returns the office today for painful, elongated, thickened toenails which she is unable to trim herself. Denies any redness or drainage around the nails. She states she had a blister on the top of her left foot, which has healed. Denies any acute changes since last appointment and no new complaints today. Denies any systemic complaints such as fevers, chills, nausea, vomiting.   Objective: AAO 3, NAD DP/PT pulses palpable, CRT less than 3 seconds Nails hypertrophic, dystrophic, elongated, brittle, discolored 10. There is tenderness overlying thes nails 1-5 bilaterally. There is no surrounding erythema or drainage along the nail sites. There is evidence of a prior blister to the top of the left foot which has healed. No surrounding erythema, drainage, or any other clinical signs of infection.  No open lesions or pre-ulcerative lesions are identified. No other areas of tenderness bilateral lower extremities. No overlying edema, erythema, increased warmth. No pain with calf compression, swelling, warmth, erythema.  Assessment: Patient presents with symptomatic onychomycosis  Plan: -Treatment options including alternatives, risks, complications were discussed -Nails sharply debrided 10 without complication/bleeding. -Discussed daily foot inspection. If there are any changes, to call the office immediately.  -Follow-up in 3 months or sooner if any problems are to arise. In the meantime, encouraged to call the office with any questions, concerns, changes symptoms.

## 2014-06-14 DIAGNOSIS — Z7901 Long term (current) use of anticoagulants: Secondary | ICD-10-CM | POA: Diagnosis not present

## 2014-06-14 DIAGNOSIS — Z86718 Personal history of other venous thrombosis and embolism: Secondary | ICD-10-CM | POA: Diagnosis not present

## 2014-07-05 DIAGNOSIS — Z7901 Long term (current) use of anticoagulants: Secondary | ICD-10-CM | POA: Diagnosis not present

## 2014-07-05 DIAGNOSIS — Z86718 Personal history of other venous thrombosis and embolism: Secondary | ICD-10-CM | POA: Diagnosis not present

## 2014-07-29 DIAGNOSIS — Z86718 Personal history of other venous thrombosis and embolism: Secondary | ICD-10-CM | POA: Diagnosis not present

## 2014-07-29 DIAGNOSIS — Z7901 Long term (current) use of anticoagulants: Secondary | ICD-10-CM | POA: Diagnosis not present

## 2014-08-30 DIAGNOSIS — I499 Cardiac arrhythmia, unspecified: Secondary | ICD-10-CM | POA: Diagnosis not present

## 2014-08-30 DIAGNOSIS — F17210 Nicotine dependence, cigarettes, uncomplicated: Secondary | ICD-10-CM | POA: Diagnosis not present

## 2014-08-30 DIAGNOSIS — M791 Myalgia: Secondary | ICD-10-CM | POA: Diagnosis not present

## 2014-08-30 DIAGNOSIS — Z5181 Encounter for therapeutic drug level monitoring: Secondary | ICD-10-CM | POA: Diagnosis not present

## 2014-08-30 DIAGNOSIS — E039 Hypothyroidism, unspecified: Secondary | ICD-10-CM | POA: Diagnosis not present

## 2014-08-30 DIAGNOSIS — M5440 Lumbago with sciatica, unspecified side: Secondary | ICD-10-CM | POA: Diagnosis not present

## 2014-09-11 ENCOUNTER — Ambulatory Visit: Payer: Medicare Other

## 2014-10-25 DIAGNOSIS — Z7901 Long term (current) use of anticoagulants: Secondary | ICD-10-CM | POA: Diagnosis not present

## 2014-10-25 DIAGNOSIS — Z86718 Personal history of other venous thrombosis and embolism: Secondary | ICD-10-CM | POA: Diagnosis not present

## 2014-11-01 DIAGNOSIS — I499 Cardiac arrhythmia, unspecified: Secondary | ICD-10-CM | POA: Diagnosis not present

## 2014-11-01 DIAGNOSIS — Z7901 Long term (current) use of anticoagulants: Secondary | ICD-10-CM | POA: Diagnosis not present

## 2014-11-15 DIAGNOSIS — Z7901 Long term (current) use of anticoagulants: Secondary | ICD-10-CM | POA: Diagnosis not present

## 2014-11-15 DIAGNOSIS — Z86718 Personal history of other venous thrombosis and embolism: Secondary | ICD-10-CM | POA: Diagnosis not present

## 2014-11-28 DIAGNOSIS — Z7901 Long term (current) use of anticoagulants: Secondary | ICD-10-CM | POA: Diagnosis not present

## 2014-11-28 DIAGNOSIS — E782 Mixed hyperlipidemia: Secondary | ICD-10-CM | POA: Diagnosis not present

## 2014-11-28 DIAGNOSIS — F17210 Nicotine dependence, cigarettes, uncomplicated: Secondary | ICD-10-CM | POA: Diagnosis not present

## 2014-11-28 DIAGNOSIS — E039 Hypothyroidism, unspecified: Secondary | ICD-10-CM | POA: Diagnosis not present

## 2014-11-28 DIAGNOSIS — Z23 Encounter for immunization: Secondary | ICD-10-CM | POA: Diagnosis not present

## 2014-11-28 DIAGNOSIS — Z0001 Encounter for general adult medical examination with abnormal findings: Secondary | ICD-10-CM | POA: Diagnosis not present

## 2014-11-28 DIAGNOSIS — I499 Cardiac arrhythmia, unspecified: Secondary | ICD-10-CM | POA: Diagnosis not present

## 2014-11-28 DIAGNOSIS — M5135 Other intervertebral disc degeneration, thoracolumbar region: Secondary | ICD-10-CM | POA: Diagnosis not present

## 2014-12-09 DIAGNOSIS — Z86718 Personal history of other venous thrombosis and embolism: Secondary | ICD-10-CM | POA: Diagnosis not present

## 2014-12-09 DIAGNOSIS — F17210 Nicotine dependence, cigarettes, uncomplicated: Secondary | ICD-10-CM | POA: Diagnosis not present

## 2014-12-09 DIAGNOSIS — E782 Mixed hyperlipidemia: Secondary | ICD-10-CM | POA: Diagnosis not present

## 2014-12-09 DIAGNOSIS — Z7901 Long term (current) use of anticoagulants: Secondary | ICD-10-CM | POA: Diagnosis not present

## 2014-12-09 DIAGNOSIS — I69354 Hemiplegia and hemiparesis following cerebral infarction affecting left non-dominant side: Secondary | ICD-10-CM | POA: Diagnosis not present

## 2014-12-09 DIAGNOSIS — R262 Difficulty in walking, not elsewhere classified: Secondary | ICD-10-CM | POA: Diagnosis not present

## 2014-12-12 DIAGNOSIS — F17210 Nicotine dependence, cigarettes, uncomplicated: Secondary | ICD-10-CM | POA: Diagnosis not present

## 2014-12-12 DIAGNOSIS — I69354 Hemiplegia and hemiparesis following cerebral infarction affecting left non-dominant side: Secondary | ICD-10-CM | POA: Diagnosis not present

## 2014-12-12 DIAGNOSIS — Z7901 Long term (current) use of anticoagulants: Secondary | ICD-10-CM | POA: Diagnosis not present

## 2014-12-19 DIAGNOSIS — I69354 Hemiplegia and hemiparesis following cerebral infarction affecting left non-dominant side: Secondary | ICD-10-CM | POA: Diagnosis not present

## 2014-12-19 DIAGNOSIS — E2839 Other primary ovarian failure: Secondary | ICD-10-CM | POA: Diagnosis not present

## 2014-12-19 DIAGNOSIS — M810 Age-related osteoporosis without current pathological fracture: Secondary | ICD-10-CM | POA: Diagnosis not present

## 2014-12-19 DIAGNOSIS — Z86718 Personal history of other venous thrombosis and embolism: Secondary | ICD-10-CM | POA: Diagnosis not present

## 2014-12-19 DIAGNOSIS — F17210 Nicotine dependence, cigarettes, uncomplicated: Secondary | ICD-10-CM | POA: Diagnosis not present

## 2014-12-19 DIAGNOSIS — Z7901 Long term (current) use of anticoagulants: Secondary | ICD-10-CM | POA: Diagnosis not present

## 2014-12-19 DIAGNOSIS — Z1231 Encounter for screening mammogram for malignant neoplasm of breast: Secondary | ICD-10-CM | POA: Diagnosis not present

## 2015-01-03 DIAGNOSIS — Z86718 Personal history of other venous thrombosis and embolism: Secondary | ICD-10-CM | POA: Diagnosis not present

## 2015-01-03 DIAGNOSIS — Z7901 Long term (current) use of anticoagulants: Secondary | ICD-10-CM | POA: Diagnosis not present

## 2015-01-24 DIAGNOSIS — Z7901 Long term (current) use of anticoagulants: Secondary | ICD-10-CM | POA: Diagnosis not present

## 2015-01-24 DIAGNOSIS — Z86718 Personal history of other venous thrombosis and embolism: Secondary | ICD-10-CM | POA: Diagnosis not present

## 2015-02-28 DIAGNOSIS — E039 Hypothyroidism, unspecified: Secondary | ICD-10-CM | POA: Diagnosis not present

## 2015-02-28 DIAGNOSIS — M5440 Lumbago with sciatica, unspecified side: Secondary | ICD-10-CM | POA: Diagnosis not present

## 2015-02-28 DIAGNOSIS — F17210 Nicotine dependence, cigarettes, uncomplicated: Secondary | ICD-10-CM | POA: Diagnosis not present

## 2015-02-28 DIAGNOSIS — Z7901 Long term (current) use of anticoagulants: Secondary | ICD-10-CM | POA: Diagnosis not present

## 2015-02-28 DIAGNOSIS — I499 Cardiac arrhythmia, unspecified: Secondary | ICD-10-CM | POA: Diagnosis not present

## 2015-03-28 DIAGNOSIS — Z7901 Long term (current) use of anticoagulants: Secondary | ICD-10-CM | POA: Diagnosis not present

## 2015-03-28 DIAGNOSIS — Z86718 Personal history of other venous thrombosis and embolism: Secondary | ICD-10-CM | POA: Diagnosis not present

## 2015-05-02 DIAGNOSIS — Z7901 Long term (current) use of anticoagulants: Secondary | ICD-10-CM | POA: Diagnosis not present

## 2015-05-02 DIAGNOSIS — Z86718 Personal history of other venous thrombosis and embolism: Secondary | ICD-10-CM | POA: Diagnosis not present

## 2015-05-20 ENCOUNTER — Ambulatory Visit: Payer: Medicare Other | Admitting: Podiatry

## 2015-06-03 ENCOUNTER — Encounter: Payer: Self-pay | Admitting: Podiatry

## 2015-06-03 ENCOUNTER — Ambulatory Visit (INDEPENDENT_AMBULATORY_CARE_PROVIDER_SITE_OTHER): Payer: Medicare Other | Admitting: Podiatry

## 2015-06-03 DIAGNOSIS — B351 Tinea unguium: Secondary | ICD-10-CM

## 2015-06-03 DIAGNOSIS — M79676 Pain in unspecified toe(s): Secondary | ICD-10-CM

## 2015-06-03 NOTE — Progress Notes (Signed)
Patient ID: Teresa Holmes, female   DOB: 07/06/1950, 65 y.o.   MRN: [id number]  Subjective: 65 y.o.-year-old female returns the office today for painful, elongated, thickened toenails which she is unable to trim herself. Denies any redness or drainage around the nails. Denies any acute changes since last appointment and no new complaints today. Denies any systemic complaints such as fevers, chills, nausea, vomiting.   Objective: AAO 3, NAD DP/PT pulses palpable, CRT less than 3 seconds Nails hypertrophic, dystrophic, elongated, brittle, discolored 10. There is tenderness overlying thes nails 1-5 bilaterally. There is no surrounding erythema or drainage along the nail sites..  No open lesions or pre-ulcerative lesions are identified. No other areas of tenderness bilateral lower extremities. No overlying edema, erythema, increased warmth. No pain with calf compression, swelling, warmth, erythema.  Assessment: Patient presents with symptomatic onychomycosis  Plan: -Treatment options including alternatives, risks, complications were discussed -Nails sharply debrided 10 without complication/bleeding. -Discussed daily foot inspection. If there are any changes, to call the office immediately.  -Follow-up in 3 months or sooner if any problems are to arise. In the meantime, encouraged to call the office with any questions, concerns, changes symptoms.  Celesta Gentile, DPM

## 2015-06-05 DIAGNOSIS — E782 Mixed hyperlipidemia: Secondary | ICD-10-CM | POA: Diagnosis not present

## 2015-06-05 DIAGNOSIS — Z5181 Encounter for therapeutic drug level monitoring: Secondary | ICD-10-CM | POA: Diagnosis not present

## 2015-06-05 DIAGNOSIS — M5440 Lumbago with sciatica, unspecified side: Secondary | ICD-10-CM | POA: Diagnosis not present

## 2015-06-05 DIAGNOSIS — E039 Hypothyroidism, unspecified: Secondary | ICD-10-CM | POA: Diagnosis not present

## 2015-06-05 DIAGNOSIS — F17210 Nicotine dependence, cigarettes, uncomplicated: Secondary | ICD-10-CM | POA: Diagnosis not present

## 2015-06-05 DIAGNOSIS — I499 Cardiac arrhythmia, unspecified: Secondary | ICD-10-CM | POA: Diagnosis not present

## 2015-07-15 DIAGNOSIS — Z7901 Long term (current) use of anticoagulants: Secondary | ICD-10-CM | POA: Diagnosis not present

## 2015-07-15 DIAGNOSIS — Z86718 Personal history of other venous thrombosis and embolism: Secondary | ICD-10-CM | POA: Diagnosis not present

## 2015-08-15 DIAGNOSIS — Z86718 Personal history of other venous thrombosis and embolism: Secondary | ICD-10-CM | POA: Diagnosis not present

## 2015-08-15 DIAGNOSIS — Z7901 Long term (current) use of anticoagulants: Secondary | ICD-10-CM | POA: Diagnosis not present

## 2015-09-04 ENCOUNTER — Ambulatory Visit: Payer: Medicare Other | Admitting: Podiatry

## 2015-09-18 DIAGNOSIS — F17210 Nicotine dependence, cigarettes, uncomplicated: Secondary | ICD-10-CM | POA: Diagnosis not present

## 2015-09-18 DIAGNOSIS — M5440 Lumbago with sciatica, unspecified side: Secondary | ICD-10-CM | POA: Diagnosis not present

## 2015-09-18 DIAGNOSIS — Z7901 Long term (current) use of anticoagulants: Secondary | ICD-10-CM | POA: Diagnosis not present

## 2015-09-18 DIAGNOSIS — I499 Cardiac arrhythmia, unspecified: Secondary | ICD-10-CM | POA: Diagnosis not present

## 2015-09-18 DIAGNOSIS — E782 Mixed hyperlipidemia: Secondary | ICD-10-CM | POA: Diagnosis not present

## 2015-09-18 DIAGNOSIS — M791 Myalgia: Secondary | ICD-10-CM | POA: Diagnosis not present

## 2015-09-18 DIAGNOSIS — E039 Hypothyroidism, unspecified: Secondary | ICD-10-CM | POA: Diagnosis not present

## 2015-10-07 ENCOUNTER — Other Ambulatory Visit: Payer: Self-pay

## 2015-10-17 DIAGNOSIS — I499 Cardiac arrhythmia, unspecified: Secondary | ICD-10-CM | POA: Diagnosis not present

## 2015-10-17 DIAGNOSIS — Z7901 Long term (current) use of anticoagulants: Secondary | ICD-10-CM | POA: Diagnosis not present

## 2015-10-24 DIAGNOSIS — Z23 Encounter for immunization: Secondary | ICD-10-CM | POA: Diagnosis not present

## 2015-10-24 DIAGNOSIS — I499 Cardiac arrhythmia, unspecified: Secondary | ICD-10-CM | POA: Diagnosis not present

## 2015-10-24 DIAGNOSIS — Z7901 Long term (current) use of anticoagulants: Secondary | ICD-10-CM | POA: Diagnosis not present

## 2015-11-28 DIAGNOSIS — I499 Cardiac arrhythmia, unspecified: Secondary | ICD-10-CM | POA: Diagnosis not present

## 2015-11-28 DIAGNOSIS — Z7901 Long term (current) use of anticoagulants: Secondary | ICD-10-CM | POA: Diagnosis not present

## 2015-12-04 DIAGNOSIS — E559 Vitamin D deficiency, unspecified: Secondary | ICD-10-CM | POA: Diagnosis not present

## 2015-12-04 DIAGNOSIS — Z124 Encounter for screening for malignant neoplasm of cervix: Secondary | ICD-10-CM | POA: Diagnosis not present

## 2015-12-04 DIAGNOSIS — M150 Primary generalized (osteo)arthritis: Secondary | ICD-10-CM | POA: Diagnosis not present

## 2015-12-04 DIAGNOSIS — Z7901 Long term (current) use of anticoagulants: Secondary | ICD-10-CM | POA: Diagnosis not present

## 2015-12-04 DIAGNOSIS — N390 Urinary tract infection, site not specified: Secondary | ICD-10-CM | POA: Diagnosis not present

## 2015-12-04 DIAGNOSIS — E039 Hypothyroidism, unspecified: Secondary | ICD-10-CM | POA: Diagnosis not present

## 2015-12-04 DIAGNOSIS — Z0001 Encounter for general adult medical examination with abnormal findings: Secondary | ICD-10-CM | POA: Diagnosis not present

## 2015-12-04 DIAGNOSIS — E782 Mixed hyperlipidemia: Secondary | ICD-10-CM | POA: Diagnosis not present

## 2015-12-04 DIAGNOSIS — F17210 Nicotine dependence, cigarettes, uncomplicated: Secondary | ICD-10-CM | POA: Diagnosis not present

## 2015-12-04 DIAGNOSIS — Z86718 Personal history of other venous thrombosis and embolism: Secondary | ICD-10-CM | POA: Diagnosis not present

## 2015-12-04 DIAGNOSIS — I10 Essential (primary) hypertension: Secondary | ICD-10-CM | POA: Diagnosis not present

## 2016-01-16 DIAGNOSIS — Z7901 Long term (current) use of anticoagulants: Secondary | ICD-10-CM | POA: Diagnosis not present

## 2016-01-16 DIAGNOSIS — I499 Cardiac arrhythmia, unspecified: Secondary | ICD-10-CM | POA: Diagnosis not present

## 2016-02-10 IMAGING — CR DG KNEE COMPLETE 4+V*L*
1 series · 4 of 4 positions shown · non-contrast
Comparison: None.

CLINICAL DATA: pt fell 1 week ago. pt unable to bear weight. no
bruising or swelling. pain since fall. No surgery.

EXAM:
LEFT KNEE - COMPLETE 4+ VIEW

[Series 1: ap · 0.17mm/px · 4 of 4 slices shown]
[im 1/4]
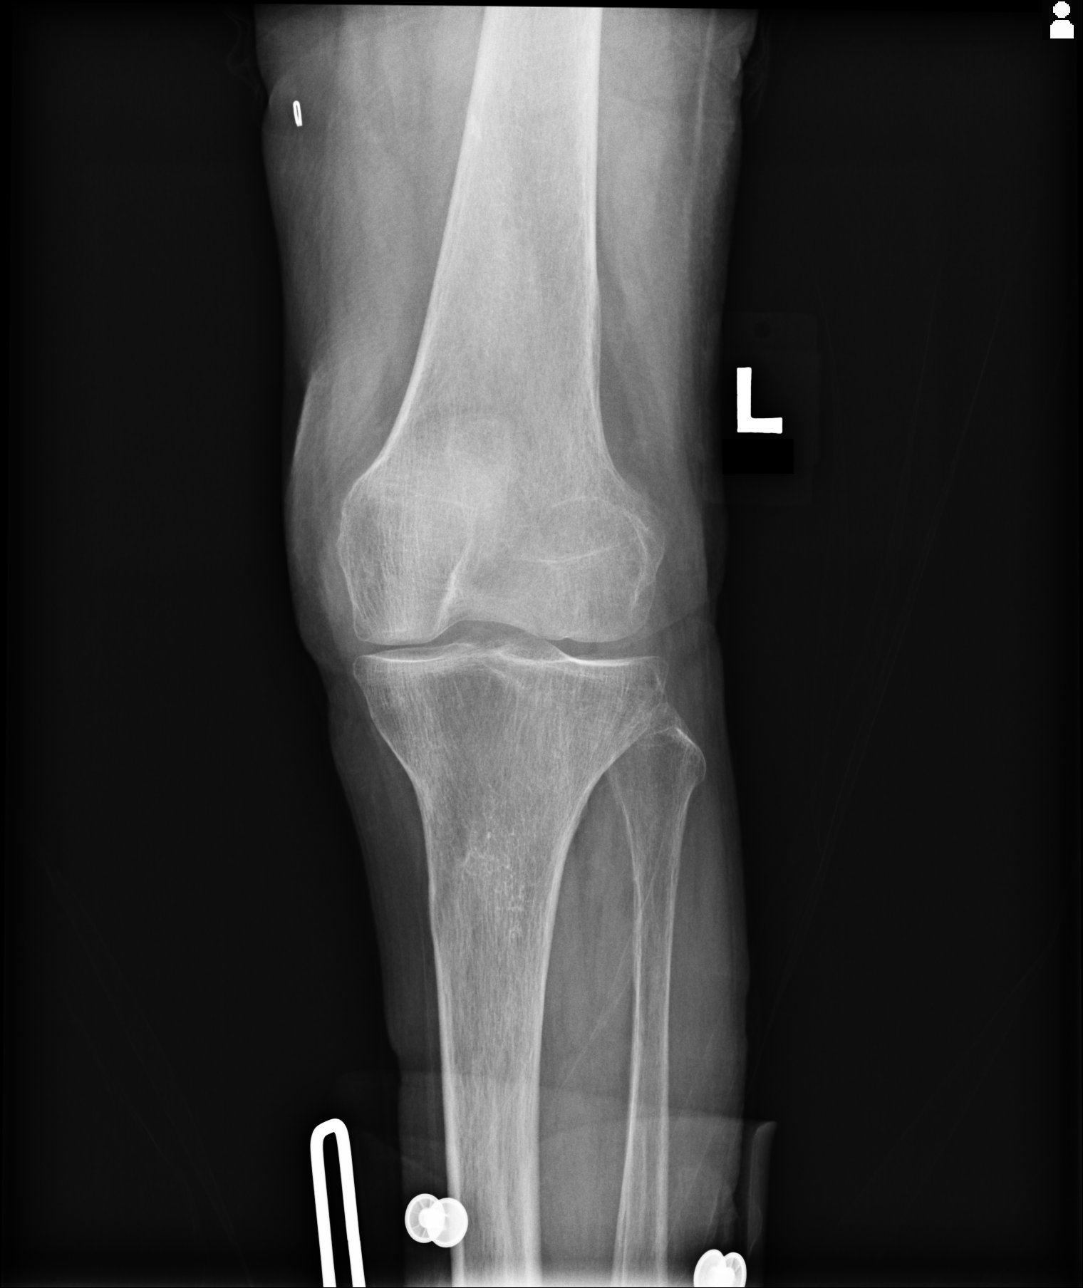
[im 2/4]
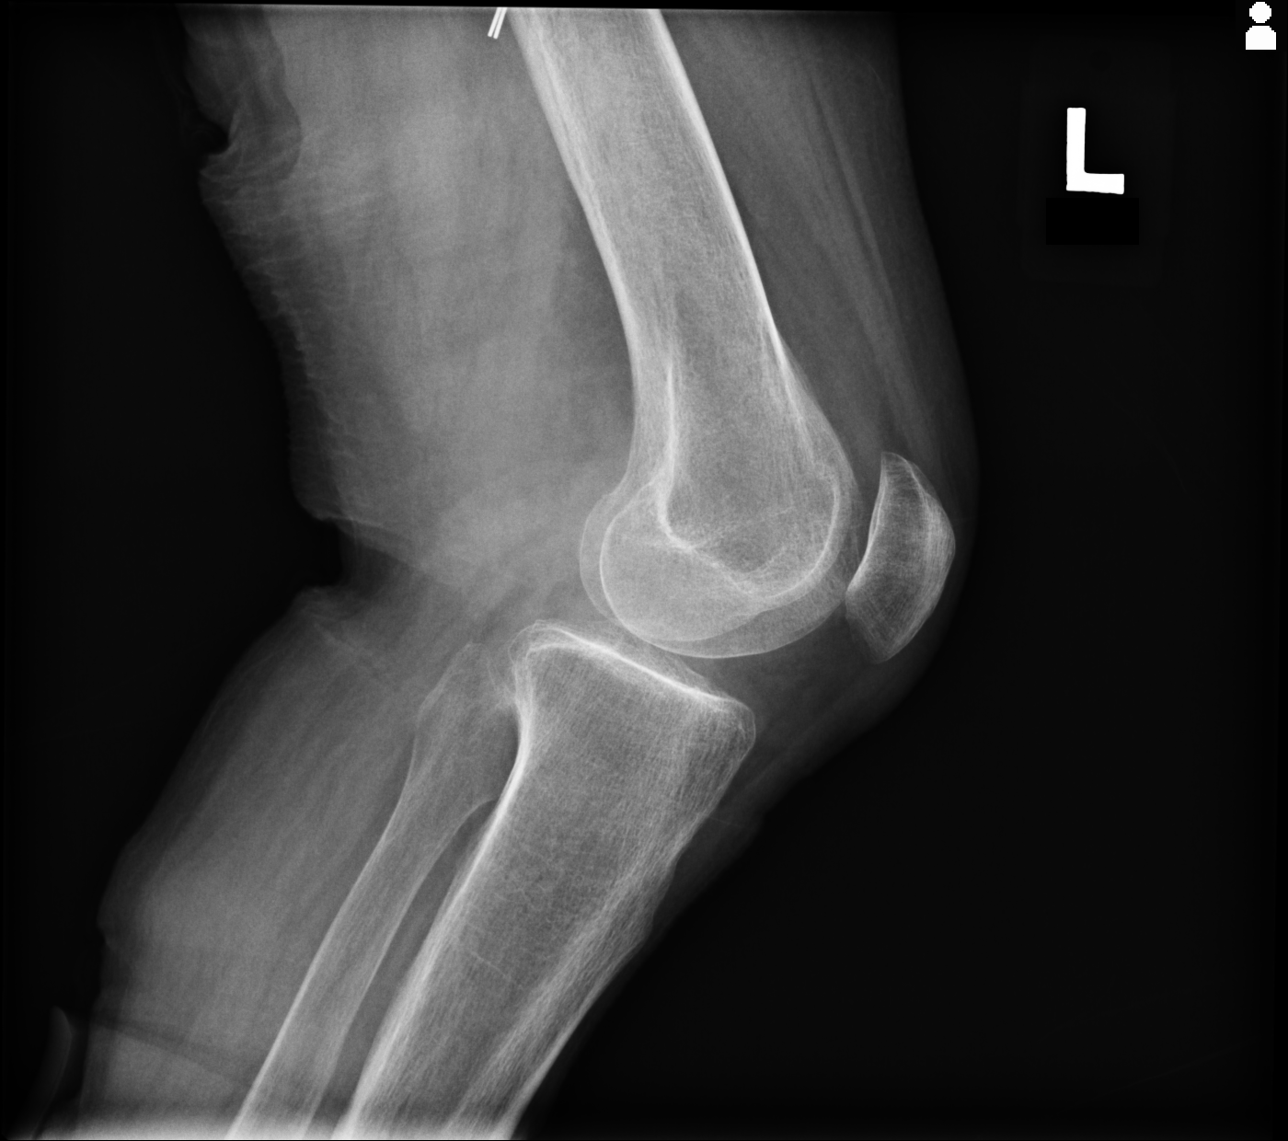
[im 3/4]
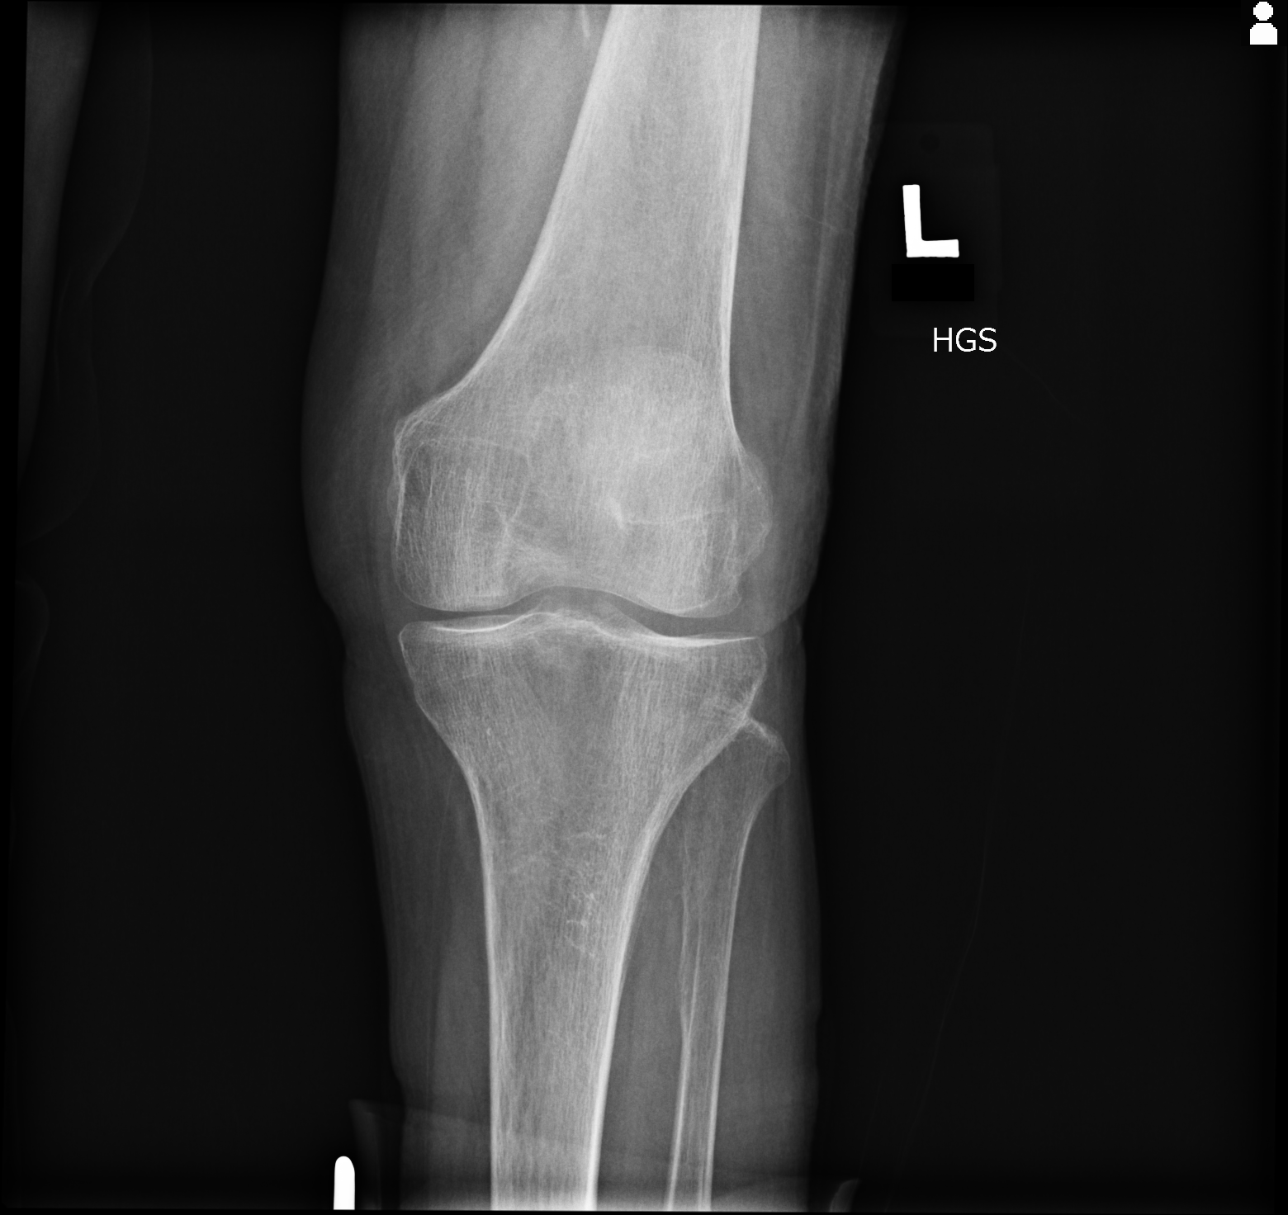
[im 4/4]
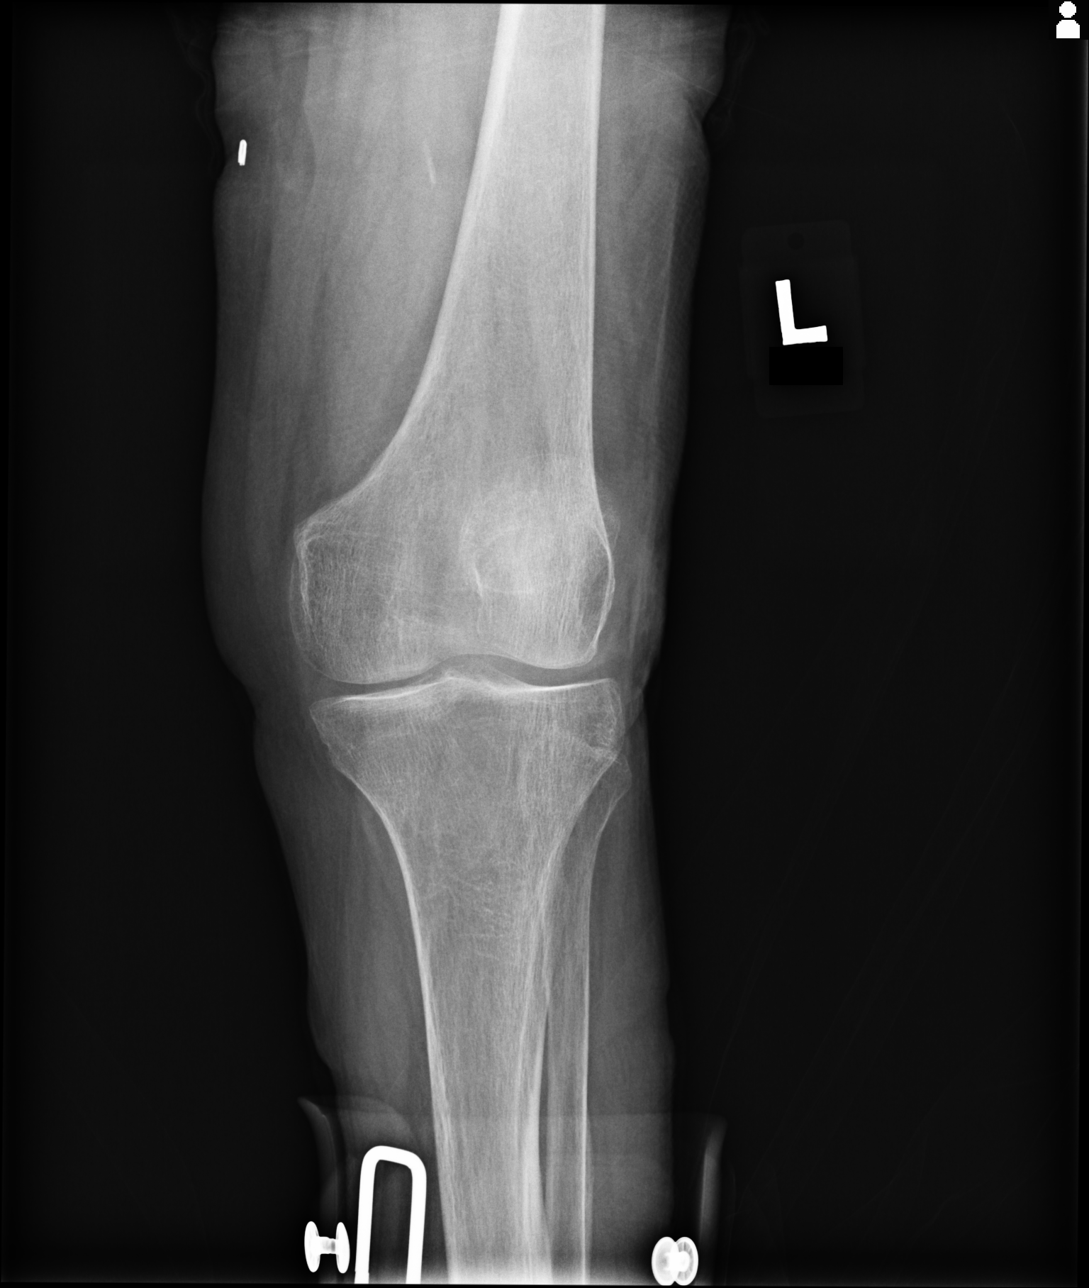

[4 of 4 positions shown; findings below may reference images not displayed]

FINDINGS: There is no evidence of fracture, dislocation, or joint effusion.
There is no evidence of arthropathy or other focal bone abnormality.
Soft tissues are unremarkable.
IMPRESSION: No acute abnormality noted.

## 2016-02-16 DIAGNOSIS — I499 Cardiac arrhythmia, unspecified: Secondary | ICD-10-CM | POA: Diagnosis not present

## 2016-02-16 DIAGNOSIS — Z7901 Long term (current) use of anticoagulants: Secondary | ICD-10-CM | POA: Diagnosis not present

## 2016-03-12 DIAGNOSIS — I10 Essential (primary) hypertension: Secondary | ICD-10-CM | POA: Diagnosis not present

## 2016-03-12 DIAGNOSIS — Z7901 Long term (current) use of anticoagulants: Secondary | ICD-10-CM | POA: Diagnosis not present

## 2016-03-12 DIAGNOSIS — Z86718 Personal history of other venous thrombosis and embolism: Secondary | ICD-10-CM | POA: Diagnosis not present

## 2016-03-12 DIAGNOSIS — M25562 Pain in left knee: Secondary | ICD-10-CM | POA: Diagnosis not present

## 2016-03-12 DIAGNOSIS — E782 Mixed hyperlipidemia: Secondary | ICD-10-CM | POA: Diagnosis not present

## 2016-03-12 DIAGNOSIS — M5440 Lumbago with sciatica, unspecified side: Secondary | ICD-10-CM | POA: Diagnosis not present

## 2016-03-12 DIAGNOSIS — F17210 Nicotine dependence, cigarettes, uncomplicated: Secondary | ICD-10-CM | POA: Diagnosis not present

## 2016-05-25 DIAGNOSIS — Z801 Family history of malignant neoplasm of trachea, bronchus and lung: Secondary | ICD-10-CM | POA: Diagnosis not present

## 2016-05-25 DIAGNOSIS — Z808 Family history of malignant neoplasm of other organs or systems: Secondary | ICD-10-CM | POA: Diagnosis not present

## 2016-05-25 DIAGNOSIS — Z803 Family history of malignant neoplasm of breast: Secondary | ICD-10-CM | POA: Diagnosis not present

## 2016-05-25 DIAGNOSIS — Z8049 Family history of malignant neoplasm of other genital organs: Secondary | ICD-10-CM | POA: Diagnosis not present

## 2016-05-25 DIAGNOSIS — Z1509 Genetic susceptibility to other malignant neoplasm: Secondary | ICD-10-CM | POA: Diagnosis not present

## 2016-05-25 DIAGNOSIS — Z809 Family history of malignant neoplasm, unspecified: Secondary | ICD-10-CM | POA: Diagnosis not present

## 2016-05-25 DIAGNOSIS — Z8041 Family history of malignant neoplasm of ovary: Secondary | ICD-10-CM | POA: Diagnosis not present

## 2016-05-25 DIAGNOSIS — Z800 Family history of malignant neoplasm of digestive organs: Secondary | ICD-10-CM | POA: Diagnosis not present

## 2016-06-17 DIAGNOSIS — M5440 Lumbago with sciatica, unspecified side: Secondary | ICD-10-CM | POA: Diagnosis not present

## 2016-06-17 DIAGNOSIS — Z7901 Long term (current) use of anticoagulants: Secondary | ICD-10-CM | POA: Diagnosis not present

## 2016-06-17 DIAGNOSIS — I499 Cardiac arrhythmia, unspecified: Secondary | ICD-10-CM | POA: Diagnosis not present

## 2016-06-17 DIAGNOSIS — I10 Essential (primary) hypertension: Secondary | ICD-10-CM | POA: Diagnosis not present

## 2016-06-17 DIAGNOSIS — F17210 Nicotine dependence, cigarettes, uncomplicated: Secondary | ICD-10-CM | POA: Diagnosis not present

## 2016-09-24 DIAGNOSIS — I69354 Hemiplegia and hemiparesis following cerebral infarction affecting left non-dominant side: Secondary | ICD-10-CM | POA: Diagnosis not present

## 2016-09-24 DIAGNOSIS — F17210 Nicotine dependence, cigarettes, uncomplicated: Secondary | ICD-10-CM | POA: Diagnosis not present

## 2016-09-24 DIAGNOSIS — I10 Essential (primary) hypertension: Secondary | ICD-10-CM | POA: Diagnosis not present

## 2016-09-24 DIAGNOSIS — M150 Primary generalized (osteo)arthritis: Secondary | ICD-10-CM | POA: Diagnosis not present

## 2016-09-24 DIAGNOSIS — Z7901 Long term (current) use of anticoagulants: Secondary | ICD-10-CM | POA: Diagnosis not present

## 2016-09-24 DIAGNOSIS — F411 Generalized anxiety disorder: Secondary | ICD-10-CM | POA: Diagnosis not present

## 2016-09-28 DIAGNOSIS — I499 Cardiac arrhythmia, unspecified: Secondary | ICD-10-CM | POA: Diagnosis not present

## 2016-09-28 DIAGNOSIS — Z7901 Long term (current) use of anticoagulants: Secondary | ICD-10-CM | POA: Diagnosis not present

## 2016-10-29 DIAGNOSIS — Z23 Encounter for immunization: Secondary | ICD-10-CM | POA: Diagnosis not present

## 2016-12-08 DIAGNOSIS — Z7901 Long term (current) use of anticoagulants: Secondary | ICD-10-CM | POA: Diagnosis not present

## 2017-01-07 DIAGNOSIS — Z7901 Long term (current) use of anticoagulants: Secondary | ICD-10-CM | POA: Diagnosis not present

## 2017-01-07 DIAGNOSIS — I499 Cardiac arrhythmia, unspecified: Secondary | ICD-10-CM | POA: Diagnosis not present

## 2017-02-11 ENCOUNTER — Ambulatory Visit (INDEPENDENT_AMBULATORY_CARE_PROVIDER_SITE_OTHER): Payer: Medicare Other

## 2017-02-11 ENCOUNTER — Other Ambulatory Visit: Payer: Self-pay | Admitting: Nurse Practitioner

## 2017-02-11 DIAGNOSIS — I824Z2 Acute embolism and thrombosis of unspecified deep veins of left distal lower extremity: Secondary | ICD-10-CM | POA: Diagnosis not present

## 2017-02-11 DIAGNOSIS — G894 Chronic pain syndrome: Secondary | ICD-10-CM

## 2017-02-11 LAB — POCT INR: INR: 4.1

## 2017-02-11 MED ORDER — HYDROCODONE-ACETAMINOPHEN 5-300 MG PO TABS: 1.0000 | ORAL_TABLET | Freq: Two times a day (BID) | ORAL | 0 refills | Status: DC | PRN

## 2017-02-11 NOTE — Progress Notes (Signed)
Sent in new rx for vicodin 5/300mg  bid prn to [address] per pharmacy request.

## 2017-03-03 ENCOUNTER — Other Ambulatory Visit: Payer: Self-pay

## 2017-03-03 MED ORDER — ATORVASTATIN CALCIUM 40 MG PO TABS: 40.0000 mg | ORAL_TABLET | Freq: Every day | ORAL | 0 refills | Status: DC

## 2017-03-14 ENCOUNTER — Other Ambulatory Visit: Payer: Self-pay

## 2017-03-14 ENCOUNTER — Ambulatory Visit (INDEPENDENT_AMBULATORY_CARE_PROVIDER_SITE_OTHER)

## 2017-03-14 DIAGNOSIS — E782 Mixed hyperlipidemia: Secondary | ICD-10-CM | POA: Insufficient documentation

## 2017-03-14 DIAGNOSIS — M5440 Lumbago with sciatica, unspecified side: Secondary | ICD-10-CM | POA: Diagnosis not present

## 2017-03-14 DIAGNOSIS — Z7901 Long term (current) use of anticoagulants: Secondary | ICD-10-CM

## 2017-03-14 DIAGNOSIS — I499 Cardiac arrhythmia, unspecified: Secondary | ICD-10-CM | POA: Diagnosis not present

## 2017-03-14 DIAGNOSIS — E559 Vitamin D deficiency, unspecified: Secondary | ICD-10-CM | POA: Diagnosis not present

## 2017-03-14 DIAGNOSIS — Q762 Congenital spondylolisthesis: Secondary | ICD-10-CM

## 2017-03-14 DIAGNOSIS — F411 Generalized anxiety disorder: Secondary | ICD-10-CM | POA: Diagnosis not present

## 2017-03-14 DIAGNOSIS — I69354 Hemiplegia and hemiparesis following cerebral infarction affecting left non-dominant side: Secondary | ICD-10-CM | POA: Diagnosis not present

## 2017-03-14 DIAGNOSIS — Z86718 Personal history of other venous thrombosis and embolism: Secondary | ICD-10-CM

## 2017-03-14 DIAGNOSIS — M791 Myalgia, unspecified site: Secondary | ICD-10-CM

## 2017-03-14 DIAGNOSIS — E039 Hypothyroidism, unspecified: Secondary | ICD-10-CM

## 2017-03-14 DIAGNOSIS — F17210 Nicotine dependence, cigarettes, uncomplicated: Secondary | ICD-10-CM | POA: Diagnosis not present

## 2017-03-14 DIAGNOSIS — M25562 Pain in left knee: Secondary | ICD-10-CM

## 2017-03-14 DIAGNOSIS — R262 Difficulty in walking, not elsewhere classified: Secondary | ICD-10-CM

## 2017-03-14 DIAGNOSIS — M5135 Other intervertebral disc degeneration, thoracolumbar region: Secondary | ICD-10-CM

## 2017-03-14 DIAGNOSIS — I10 Essential (primary) hypertension: Secondary | ICD-10-CM

## 2017-03-14 LAB — POCT INR: INR: 4.6

## 2017-03-14 MED ORDER — ATORVASTATIN CALCIUM 40 MG PO TABS: 40.0000 mg | ORAL_TABLET | Freq: Every day | ORAL | 1 refills | Status: DC

## 2017-03-14 MED ORDER — ALBUTEROL SULFATE HFA 108 (90 BASE) MCG/ACT IN AERS: 2.0000 | INHALATION_SPRAY | RESPIRATORY_TRACT | 3 refills | Status: DC | PRN

## 2017-03-15 NOTE — Addendum Note (Signed)
Addended by: Edd Arbour on: 03/15/2017 10:59 AM   Modules accepted: Level of Service

## 2017-03-28 ENCOUNTER — Other Ambulatory Visit: Payer: Self-pay

## 2017-03-28 MED ORDER — LEVOTHYROXINE SODIUM 50 MCG PO TABS: 50.0000 ug | ORAL_TABLET | Freq: Every day | ORAL | 3 refills | Status: DC

## 2017-04-05 ENCOUNTER — Other Ambulatory Visit: Payer: Self-pay

## 2017-04-05 MED ORDER — LEVOTHYROXINE SODIUM 50 MCG PO TABS: 50.0000 ug | ORAL_TABLET | Freq: Every day | ORAL | 3 refills | Status: DC

## 2017-04-21 ENCOUNTER — Ambulatory Visit (INDEPENDENT_AMBULATORY_CARE_PROVIDER_SITE_OTHER): Admitting: Nurse Practitioner

## 2017-04-21 ENCOUNTER — Encounter: Payer: Self-pay | Admitting: Nurse Practitioner

## 2017-04-21 VITALS — BP 118/80 | HR 101 | Resp 18 | Ht <= 58 in | Wt 106.2 lb

## 2017-04-21 DIAGNOSIS — E782 Mixed hyperlipidemia: Secondary | ICD-10-CM

## 2017-04-21 DIAGNOSIS — E039 Hypothyroidism, unspecified: Secondary | ICD-10-CM

## 2017-04-21 DIAGNOSIS — G894 Chronic pain syndrome: Secondary | ICD-10-CM | POA: Diagnosis not present

## 2017-04-21 DIAGNOSIS — I4891 Unspecified atrial fibrillation: Secondary | ICD-10-CM | POA: Diagnosis not present

## 2017-04-21 DIAGNOSIS — Z79899 Other long term (current) drug therapy: Secondary | ICD-10-CM | POA: Diagnosis not present

## 2017-04-21 LAB — POCT INR

## 2017-04-21 MED ORDER — FENOFIBRATE 145 MG PO TABS: 145.0000 mg | ORAL_TABLET | Freq: Every day | ORAL | 4 refills | Status: DC

## 2017-04-21 MED ORDER — ATORVASTATIN CALCIUM 40 MG PO TABS: 40.0000 mg | ORAL_TABLET | Freq: Every day | ORAL | 4 refills | Status: DC

## 2017-04-21 MED ORDER — HYDROCODONE-ACETAMINOPHEN 5-300 MG PO TABS: 1.0000 | ORAL_TABLET | Freq: Three times a day (TID) | ORAL | 0 refills | Status: DC | PRN

## 2017-04-21 NOTE — Progress Notes (Signed)
[facility] [place], [place] [address]  Internal MEDICINE  Office Visit Note  Patient Name: Teresa Holmes  [id number]  [id number]  Date of Service: 05/11/2017  Chief Complaint  Patient presents with  . Fall    left knee and left leg pain.     The patient is here for routine follow up exam. She states that she fell a few weeks ago. Re injured her left knee. Does take hydrocodone/APAP p=5/325mg  twice daily when needed, however, since fall, she is having to take this slightly more often. She is trying to be more active, and pain is interfering with her ability to do these activities.  She is also here for INR check.    Pt is here for routine follow up.    Current Medication: Outpatient Encounter Medications as of 04/21/2017  Medication Sig Note  . albuterol (VENTOLIN HFA) 108 (90 Base) MCG/ACT inhaler Inhale 2 puffs into the lungs every 4 (four) hours as needed for wheezing or shortness of breath.   Marland Kitchen arformoterol (BROVANA) 15 MCG/2ML NEBU Take 15 mcg by nebulization 2 (two) times daily.   . diazepam (VALIUM) 2 MG tablet Take by mouth.   . levothyroxine (SYNTHROID, LEVOTHROID) 50 MCG tablet Take 1 tablet (50 mcg total) by mouth daily before breakfast.   . warfarin (COUMADIN) 2 MG tablet Take 2 mg by mouth daily.   Marland Kitchen warfarin (COUMADIN) 3 MG tablet Take by mouth.   . [DISCONTINUED] atorvastatin (LIPITOR) 40 MG tablet Take 1 tablet (40 mg total) by mouth daily.   . [DISCONTINUED] atorvastatin (LIPITOR) 40 MG tablet Take 1 tablet (40 mg total) by mouth daily.   . [DISCONTINUED] atorvastatin (LIPITOR) 40 MG tablet Take 1 tablet (40 mg total) by mouth daily.   . [DISCONTINUED] fenofibrate (TRICOR) 145 MG tablet Take 145 mg by mouth daily.   . [DISCONTINUED] fenofibrate (TRICOR) 145 MG tablet Take 1 tablet (145 mg total) by mouth daily.   . [DISCONTINUED] fenofibrate (TRICOR) 145 MG tablet Take 1 tablet (145 mg total) by mouth daily.   . [DISCONTINUED]  Hydrocodone-Acetaminophen 5-300 MG TABS Take 1 tablet by mouth 2 (two) times daily as needed.   . [DISCONTINUED] HYDROcodone-Acetaminophen 5-300 MG TABS Take 1 tablet by mouth 3 (three) times daily as needed.   . [DISCONTINUED] HYDROcodone-Acetaminophen 5-300 MG TABS Take 1 tablet by mouth 3 (three) times daily as needed. 05/04/2017: backordered medication   No facility-administered encounter medications on file as of 04/21/2017.     Surgical History: No past surgical history on file.  Medical History: No past medical history on file.  Family History: No family history on file.  Social History   Socioeconomic History  . Marital status: Married    Spouse name: Not on file  . Number of children: Not on file  . Years of education: Not on file  . Highest education level: Not on file  Occupational History  . Not on file  Social Needs  . Financial resource strain: Not on file  . Food insecurity:    Worry: Not on file    Inability: Not on file  . Transportation needs:    Medical: Not on file    Non-medical: Not on file  Tobacco Use  . Smoking status: Current Every Day Smoker  . Smokeless tobacco: Never Used  Substance and Sexual Activity  . Alcohol use: No  . Drug use: Not on file  . Sexual activity: Not on file  Lifestyle  .  Physical activity:    Days per week: Not on file    Minutes per session: Not on file  . Stress: Not on file  Relationships  . Social connections:    Talks on phone: Not on file    Gets together: Not on file    Attends religious service: Not on file    Active member of club or organization: Not on file    Attends meetings of clubs or organizations: Not on file    Relationship status: Not on file  . Intimate partner violence:    Fear of current or ex partner: Not on file    Emotionally abused: Not on file    Physically abused: Not on file    Forced sexual activity: Not on file  Other Topics Concern  . Not on file  Social History Narrative  . Not  on file      Review of Systems  Constitutional: Negative for activity change, chills, fatigue and unexpected weight change.  HENT: Negative for congestion, postnasal drip, rhinorrhea, sneezing and sore throat.   Eyes: Negative for redness.  Respiratory: Positive for shortness of breath. Negative for cough and chest tightness.   Cardiovascular: Negative for chest pain and palpitations.  Gastrointestinal: Negative for abdominal pain, constipation, diarrhea, nausea and vomiting.  Endocrine:       Well controlled thyroid panel   Genitourinary: Negative.  Negative for dysuria and frequency.  Musculoskeletal: Positive for arthralgias and gait problem. Negative for back pain, joint swelling and neck pain.       Left knee and left leg pain after fall.   Skin: Negative for rash.  Allergic/Immunologic: Positive for environmental allergies.  Neurological: Negative for tremors and numbness.       Wekness of left upper and lower extremities after suffering stroke several years ago.   Hematological: Negative for adenopathy. Does not bruise/bleed easily.  Psychiatric/Behavioral: Negative for behavioral problems (Depression), sleep disturbance and suicidal ideas. The patient is nervous/anxious.     Today's Vitals   04/21/17 1410  BP: 118/80  Pulse: (!) 101  Resp: 18  SpO2: 95%  Weight: 106 lb 3.2 oz (48.2 kg)  Height: 4\' 7"  (1.397 m)    Physical Exam  Constitutional: She is oriented to person, place, and time. She appears well-developed and well-nourished. No distress.  HENT:  Head: Normocephalic and atraumatic.  Mouth/Throat: Oropharynx is clear and moist. No oropharyngeal exudate.  Eyes: Pupils are equal, round, and reactive to light. EOM are normal.  Neck: Normal range of motion. Neck supple. No JVD present. No tracheal deviation present. No thyromegaly present.  Cardiovascular: Normal rate, regular rhythm and normal heart sounds. Exam reveals no gallop and no friction rub.  No murmur  heard. Pulmonary/Chest: Effort normal. No respiratory distress. She has no wheezes. She has no rales. She exhibits no tenderness.  Abdominal: Soft. Bowel sounds are normal.  Musculoskeletal:  Left hip and knee pain. Wearing knee brace on left knee. Using a cane to help with ambulation and balance .  Lymphadenopathy:    She has no cervical adenopathy.  Neurological: She is alert and oriented to person, place, and time. No cranial nerve deficit.  Left upper extremity weakness. She is at her neurological baseline .  Skin: Skin is warm and dry. She is not diaphoretic.  Psychiatric: Her speech is normal and behavior is normal. Judgment and thought content normal. Her mood appears anxious. Cognition and memory are normal.  Nursing note and vitals reviewed.  Assessment/Plan: 1.  Acquired hypothyroidism Thyroid stable. Continue levothyroxine as prescribed  2. Atrial fibrillation, unspecified type ([place]) Chronic warfarin therapy.  - POCT INR 1.8 today. Patient due to take warfarin at 5pm. No changes made to dosing regimen. Recheck in 1 month.   3. Encounter for long-term (current) use of medications Warfarin dosing as prescribed.  4. Chronic pain disorder For short period, will increase hydrocodone/APAP 5/300mg  to TID prn pain. Two 30 day prescriptions provided. Dates are 04/21/2017 and 05/31/2017.   5. Mixed hyperlipidemia Continue atorvastatin and fenofibrate as prescribed. Refills for both provided today   General Counseling: Willodean verbalizes understanding of the findings of todays visit and agrees with plan of treatment. I have discussed any further diagnostic evaluation that may be needed or ordered today. We also reviewed her medications today. she has been encouraged to call the office with any questions or concerns that should arise related to todays visit.   Reviewed risks and possible side effects associated with taking opiates and benzodiazepines. Combination of these could cause  dizziness and drowsiness. Advised him not to drive or operate machinery when taking these medications, as he could put his life and the lives of others at risk. He voiced understanding.   This patient was seen by [name], FNP- C in Collaboration with Dr Lavera Guise as a part of collaborative care agreement    Orders Placed This Encounter  Procedures  . POCT INR    Meds ordered this encounter  Medications  . DISCONTD: HYDROcodone-Acetaminophen 5-300 MG TABS    Sig: Take 1 tablet by mouth 3 (three) times daily as needed.    Dispense:  90 each    Refill:  0    Order Specific Question:   Supervising Provider    Answer:   Lavera Guise [place]  . DISCONTD: HYDROcodone-Acetaminophen 5-300 MG TABS    Sig: Take 1 tablet by mouth 3 (three) times daily as needed.    Dispense:  90 each    Refill:  0    Fill after 05/31/2017    Order Specific Question:   Supervising Provider    Answer:   Lavera Guise [[id number]]  . DISCONTD: atorvastatin (LIPITOR) 40 MG tablet    Sig: Take 1 tablet (40 mg total) by mouth daily.    Dispense:  90 tablet    Refill:  4    Order Specific Question:   Supervising Provider    Answer:   Lavera Guise [[id number]]  . DISCONTD: fenofibrate (TRICOR) 145 MG tablet    Sig: Take 1 tablet (145 mg total) by mouth daily.    Dispense:  90 tablet    Refill:  4    Order Specific Question:   Supervising Provider    Answer:   Lavera Guise [[id number]]  . DISCONTD: atorvastatin (LIPITOR) 40 MG tablet    Sig: Take 1 tablet (40 mg total) by mouth daily.    Dispense:  90 tablet    Refill:  4    Order Specific Question:   Supervising Provider    Answer:   Lavera Guise [[id number]]  . DISCONTD: fenofibrate (TRICOR) 145 MG tablet    Sig: Take 1 tablet (145 mg total) by mouth daily.    Dispense:  90 tablet    Refill:  4    Order Specific Question:   Supervising Provider    Answer:   Lavera Guise [[id number]]    Time spent: 20 Minutes  Dr Lavera Guise Internal medicine

## 2017-04-27 ENCOUNTER — Other Ambulatory Visit: Payer: Self-pay

## 2017-04-27 DIAGNOSIS — E782 Mixed hyperlipidemia: Secondary | ICD-10-CM

## 2017-04-27 MED ORDER — FENOFIBRATE 145 MG PO TABS: 145.0000 mg | ORAL_TABLET | Freq: Every day | ORAL | 4 refills | Status: DC

## 2017-04-27 MED ORDER — ATORVASTATIN CALCIUM 40 MG PO TABS: 40.0000 mg | ORAL_TABLET | Freq: Every day | ORAL | 4 refills | Status: DC

## 2017-05-04 ENCOUNTER — Other Ambulatory Visit: Payer: Self-pay | Admitting: Nurse Practitioner

## 2017-05-04 DIAGNOSIS — G894 Chronic pain syndrome: Secondary | ICD-10-CM

## 2017-05-04 DIAGNOSIS — M5135 Other intervertebral disc degeneration, thoracolumbar region: Secondary | ICD-10-CM

## 2017-05-04 MED ORDER — HYDROCODONE-ACETAMINOPHEN 5-325 MG PO TABS: 1.0000 | ORAL_TABLET | Freq: Three times a day (TID) | ORAL | 0 refills | Status: DC | PRN

## 2017-05-04 NOTE — Progress Notes (Signed)
Due to back order of patient's usual pain medication, changed to norco 5/325mg  TID prn. Two 30 day prescriptios provided. Dates are 05/04/2017 and 05/31/2017

## 2017-05-09 ENCOUNTER — Other Ambulatory Visit: Payer: Self-pay

## 2017-05-09 DIAGNOSIS — E782 Mixed hyperlipidemia: Secondary | ICD-10-CM

## 2017-05-09 MED ORDER — ATORVASTATIN CALCIUM 40 MG PO TABS: 40.0000 mg | ORAL_TABLET | Freq: Every day | ORAL | 4 refills | Status: DC

## 2017-05-09 MED ORDER — FENOFIBRATE 145 MG PO TABS: 145.0000 mg | ORAL_TABLET | Freq: Every day | ORAL | 4 refills | Status: DC

## 2017-05-11 ENCOUNTER — Other Ambulatory Visit: Payer: Self-pay | Admitting: Nurse Practitioner

## 2017-05-11 DIAGNOSIS — E782 Mixed hyperlipidemia: Secondary | ICD-10-CM

## 2017-05-11 DIAGNOSIS — G894 Chronic pain syndrome: Secondary | ICD-10-CM | POA: Insufficient documentation

## 2017-05-11 DIAGNOSIS — Z79899 Other long term (current) drug therapy: Secondary | ICD-10-CM | POA: Insufficient documentation

## 2017-05-11 MED ORDER — ATORVASTATIN CALCIUM 40 MG PO TABS: 40.0000 mg | ORAL_TABLET | Freq: Every day | ORAL | 4 refills | Status: DC

## 2017-05-18 ENCOUNTER — Other Ambulatory Visit: Payer: Self-pay

## 2017-05-24 ENCOUNTER — Ambulatory Visit: Payer: Self-pay

## 2017-06-15 ENCOUNTER — Ambulatory Visit (INDEPENDENT_AMBULATORY_CARE_PROVIDER_SITE_OTHER)

## 2017-06-15 DIAGNOSIS — I4891 Unspecified atrial fibrillation: Secondary | ICD-10-CM

## 2017-06-15 LAB — POCT INR

## 2017-06-15 NOTE — Progress Notes (Signed)
Pt inr 8.0 96.0  Per heather to skip two days and resume the same and follow up next week to check inr again.

## 2017-06-22 ENCOUNTER — Ambulatory Visit (INDEPENDENT_AMBULATORY_CARE_PROVIDER_SITE_OTHER)

## 2017-06-22 DIAGNOSIS — I4891 Unspecified atrial fibrillation: Secondary | ICD-10-CM

## 2017-06-22 LAB — POCT INR: INR: 4.4 — AB (ref 2.0–3.0)

## 2017-06-22 NOTE — Progress Notes (Signed)
INR 4.4 PT 11  SKIP MEDICATION ONE NIGHT , CONTINUE WITH MEDS PER HEATHER AND RECHECK TWO WEEKS

## 2017-07-06 ENCOUNTER — Telehealth: Payer: Self-pay

## 2017-07-06 ENCOUNTER — Ambulatory Visit (INDEPENDENT_AMBULATORY_CARE_PROVIDER_SITE_OTHER)

## 2017-07-06 DIAGNOSIS — I4891 Unspecified atrial fibrillation: Secondary | ICD-10-CM

## 2017-07-06 LAB — POCT INR: INR: 5.2 — AB (ref 2.0–3.0)

## 2017-07-06 NOTE — Progress Notes (Signed)
Per Nira Conn skip dose tonight and restart 2mg  Thursday evening and recheck in two weeks

## 2017-07-07 NOTE — Telephone Encounter (Signed)
Error

## 2017-07-25 ENCOUNTER — Encounter: Payer: Self-pay | Admitting: Nurse Practitioner

## 2017-08-01 ENCOUNTER — Encounter: Payer: Self-pay | Admitting: Nurse Practitioner

## 2017-08-23 ENCOUNTER — Ambulatory Visit: Payer: Self-pay | Admitting: Nurse Practitioner

## 2017-09-05 ENCOUNTER — Encounter: Payer: Self-pay | Admitting: Adult Health

## 2017-09-05 ENCOUNTER — Ambulatory Visit (INDEPENDENT_AMBULATORY_CARE_PROVIDER_SITE_OTHER): Payer: Medicare Other | Admitting: Adult Health

## 2017-09-05 VITALS — BP 190/92 | HR 75 | Resp 16 | Ht <= 58 in | Wt 107.0 lb

## 2017-09-05 DIAGNOSIS — I4891 Unspecified atrial fibrillation: Secondary | ICD-10-CM | POA: Diagnosis not present

## 2017-09-05 DIAGNOSIS — R300 Dysuria: Secondary | ICD-10-CM

## 2017-09-05 DIAGNOSIS — IMO0001 Reserved for inherently not codable concepts without codable children: Secondary | ICD-10-CM

## 2017-09-05 DIAGNOSIS — I10 Essential (primary) hypertension: Secondary | ICD-10-CM

## 2017-09-05 DIAGNOSIS — F17200 Nicotine dependence, unspecified, uncomplicated: Secondary | ICD-10-CM

## 2017-09-05 DIAGNOSIS — E039 Hypothyroidism, unspecified: Secondary | ICD-10-CM

## 2017-09-05 DIAGNOSIS — G894 Chronic pain syndrome: Secondary | ICD-10-CM

## 2017-09-05 LAB — POCT INR: INR: 3.3 — AB (ref 2.0–3.0)

## 2017-09-05 MED ORDER — WARFARIN SODIUM 2 MG PO TABS: 2.0000 mg | ORAL_TABLET | Freq: Every day | ORAL | 2 refills | Status: DC

## 2017-09-05 MED ORDER — HYDROCODONE-ACETAMINOPHEN 5-325 MG PO TABS: 1.0000 | ORAL_TABLET | Freq: Three times a day (TID) | ORAL | 0 refills | Status: DC | PRN

## 2017-09-05 NOTE — Patient Instructions (Signed)

## 2017-09-05 NOTE — Progress Notes (Signed)
[facility] [place],  [address]  Internal MEDICINE  Office Visit Note  Patient Name: Teresa Holmes  [id number]  [id number]  Date of Service: 09/05/2017  Chief Complaint  Patient presents with  . Annual Exam    HPI Pt here for follow up.  She states she is here to have her hydrocodone refilled.  She takes hydrocodone for chronic pain.      Current Medication: Outpatient Encounter Medications as of 09/05/2017  Medication Sig  . albuterol (VENTOLIN HFA) 108 (90 Base) MCG/ACT inhaler Inhale 2 puffs into the lungs every 4 (four) hours as needed for wheezing or shortness of breath.  Marland Kitchen arformoterol (BROVANA) 15 MCG/2ML NEBU Take 15 mcg by nebulization 2 (two) times daily.  Marland Kitchen atorvastatin (LIPITOR) 40 MG tablet Take 1 tablet (40 mg total) by mouth daily.  . diazepam (VALIUM) 2 MG tablet Take by mouth.  . fenofibrate (TRICOR) 145 MG tablet Take 1 tablet (145 mg total) by mouth daily.  Marland Kitchen HYDROcodone-acetaminophen (NORCO) 5-325 MG tablet Take 1 tablet by mouth 3 (three) times daily as needed for moderate pain.  Marland Kitchen levothyroxine (SYNTHROID, LEVOTHROID) 50 MCG tablet Take 1 tablet (50 mcg total) by mouth daily before breakfast.  . warfarin (COUMADIN) 2 MG tablet Take 2 mg by mouth daily.  Marland Kitchen warfarin (COUMADIN) 3 MG tablet Take by mouth.   No facility-administered encounter medications on file as of 09/05/2017.     Surgical History: Past Surgical History:  Procedure Laterality Date  . ABDOMINAL HYSTERECTOMY    . AORTIC/RENAL BYPASS    . bypass graft      Medical History: Past Medical History:  Diagnosis Date  . Aneurysm ([place])   . Anxiety   . Arthritis   . Cancer ([place])   . CHF (congestive heart failure) ([place])   . COPD (chronic obstructive pulmonary disease) ([place])   . Hyperlipidemia   . Hypertension   . Stroke ([place])   . Thyroid disease     Family History: Family History  Problem Relation Age of Onset  . Cancer Mother     Social History    Socioeconomic History  . Marital status: Married    Spouse name: Not on file  . Number of children: Not on file  . Years of education: Not on file  . Highest education level: Not on file  Occupational History  . Not on file  Social Needs  . Financial resource strain: Not on file  . Food insecurity:    Worry: Not on file    Inability: Not on file  . Transportation needs:    Medical: Not on file    Non-medical: Not on file  Tobacco Use  . Smoking status: Current Every Day Smoker  . Smokeless tobacco: Never Used  Substance and Sexual Activity  . Alcohol use: No  . Drug use: Never  . Sexual activity: Not on file  Lifestyle  . Physical activity:    Days per week: Not on file    Minutes per session: Not on file  . Stress: Not on file  Relationships  . Social connections:    Talks on phone: Not on file    Gets together: Not on file    Attends religious service: Not on file    Active member of club or organization: Not on file    Attends meetings of clubs or organizations: Not on file    Relationship status: Not on file  . Intimate partner violence:  Fear of current or ex partner: Not on file    Emotionally abused: Not on file    Physically abused: Not on file    Forced sexual activity: Not on file  Other Topics Concern  . Not on file  Social History Narrative  . Not on file      Review of Systems  Constitutional: Negative for chills, fatigue and unexpected weight change.  HENT: Negative for congestion, rhinorrhea, sneezing and sore throat.   Eyes: Negative for photophobia, pain and redness.  Respiratory: Negative for cough, chest tightness and shortness of breath.   Cardiovascular: Negative for chest pain and palpitations.  Gastrointestinal: Negative for abdominal pain, constipation, diarrhea, nausea and vomiting.  Endocrine: Negative.   Genitourinary: Negative for dysuria and frequency.  Musculoskeletal: Negative for arthralgias, back pain, joint swelling and  neck pain.  Skin: Negative for rash.  Allergic/Immunologic: Negative.   Neurological: Negative for tremors and numbness.  Hematological: Negative for adenopathy. Does not bruise/bleed easily.  Psychiatric/Behavioral: Negative for behavioral problems and sleep disturbance. The patient is not nervous/anxious.     Vital Signs: BP (!) 190/92   Pulse 75   Resp 16   Ht 4\' 7"  (1.397 m)   Wt 107 lb (48.5 kg)   SpO2 97%   BMI 24.87 kg/m    Physical Exam  Constitutional: She is oriented to person, place, and time. She appears well-developed and well-nourished. No distress.  HENT:  Head: Normocephalic and atraumatic.  Mouth/Throat: Oropharynx is clear and moist. No oropharyngeal exudate.  Eyes: Pupils are equal, round, and reactive to light. EOM are normal.  Neck: Normal range of motion. Neck supple. No JVD present. No tracheal deviation present. No thyromegaly present.  Cardiovascular: Normal rate, regular rhythm and normal heart sounds. Exam reveals no gallop and no friction rub.  No murmur heard. Pulmonary/Chest: Effort normal and breath sounds normal. No respiratory distress. She has no wheezes. She has no rales. She exhibits no tenderness.  Abdominal: Soft. There is no tenderness. There is no guarding.  Musculoskeletal: Normal range of motion.  Lymphadenopathy:    She has no cervical adenopathy.  Neurological: She is alert and oriented to person, place, and time. No cranial nerve deficit.  Skin: Skin is warm and dry. She is not diaphoretic.  Psychiatric: She has a normal mood and affect. Her behavior is normal. Judgment and thought content normal.  Nursing note and vitals reviewed.   Assessment/Plan: 1. Atrial fibrillation, unspecified type ([place]) Pt will skip today's dose of Coumadin.  And restart tomorrow at 2mg  daily.  today's INR 3.3 - POCT INR - warfarin (COUMADIN) 2 MG tablet; Take 1 tablet (2 mg total) by mouth daily.  Dispense: 90 tablet; Refill: 2  2. Chronic pain  disorder - HYDROcodone-acetaminophen (NORCO) 5-325 MG tablet; Take 1 tablet by mouth 3 (three) times daily as needed for moderate pain.  Dispense: 90 tablet; Refill: 0  3. Essential (primary) hypertension Stable, continue medications.  Hypertension Counseling:   The following hypertensive lifestyle modification were recommended and discussed:  1. Limiting alcohol intake to less than 1 oz/day of ethanol:(24 oz of beer or 8 oz of wine or 2 oz of 100-proof whiskey). 2. Take baby ASA 81 mg daily. 3. Importance of regular aerobic exercise and losing weight. 4. Reduce dietary saturated fat and cholesterol intake for overall cardiovascular health. 5. Maintaining adequate dietary potassium, calcium, and magnesium intake. 6. Regular monitoring of the blood pressure. 7. Reduce sodium intake to less than 100 mmol/day (less  than 2.3 gm of sodium or less than 6 gm of sodium choride)  4. Acquired hypothyroidism Continue medications.   5. Smoking Smoking cessation counseling: 1. Pt acknowledges the risks of long term smoking, she will try to quite smoking. 2. Options for different medications including nicotine products, chewing gum, patch etc, Wellbutrin and Chantix is discussed 3. Goal and date of compete cessation is discussed 4. Total time spent in smoking cessation is 15 min.  General Counseling: Jaicee verbalizes understanding of the findings of todays visit and agrees with plan of treatment. I have discussed any further diagnostic evaluation that may be needed or ordered today. We also reviewed her medications today. she has been encouraged to call the office with any questions or concerns that should arise related to todays visit.    Orders Placed This Encounter  Procedures  . POCT INR    No orders of the defined types were placed in this encounter.   Time spent: 25 Minutes   This patient was seen by Orson Gear AGNP-C in Collaboration with Dr Lavera Guise as a part of collaborative  care agreement    Dr Lavera Guise Internal medicine

## 2017-09-07 LAB — UA/M W/RFLX CULTURE, ROUTINE
Bilirubin, UA: NEGATIVE
Glucose, UA: NEGATIVE
Ketones, UA: NEGATIVE
Nitrite, UA: NEGATIVE
Protein, UA: NEGATIVE
RBC, UA: NEGATIVE
Specific Gravity, UA: 1.016 (ref 1.005–1.030)
Urobilinogen, Ur: 1 mg/dL (ref 0.2–1.0)
pH, UA: 5.5 (ref 5.0–7.5)

## 2017-09-07 LAB — MICROSCOPIC EXAMINATION
Bacteria, UA: NONE SEEN
Casts: NONE SEEN /lpf
Epithelial Cells (non renal): NONE SEEN /hpf (ref 0–10)

## 2017-09-07 LAB — URINE CULTURE, REFLEX

## 2017-09-12 ENCOUNTER — Telehealth: Payer: Self-pay | Admitting: Adult Health

## 2017-09-12 ENCOUNTER — Other Ambulatory Visit: Payer: Self-pay | Admitting: Adult Health

## 2017-09-12 MED ORDER — NITROFURANTOIN MONOHYD MACRO 100 MG PO CAPS: 100.0000 mg | ORAL_CAPSULE | Freq: Two times a day (BID) | ORAL | 0 refills | Status: DC

## 2017-09-12 NOTE — Telephone Encounter (Signed)
-----   Message from Kendell Bane, NP sent at 09/12/2017  8:28 AM EDT ----- Regarding: Urine Please let patient know I sent 7 days of Macrobid to [facility].  Her urine came back and showed she had a slight UTI.

## 2017-09-12 NOTE — Progress Notes (Unsigned)
RX for [employer] sent to Pharmacy.

## 2017-09-12 NOTE — Telephone Encounter (Signed)
Pt called and notified of results and medication called into pharmacy

## 2017-10-05 ENCOUNTER — Ambulatory Visit (INDEPENDENT_AMBULATORY_CARE_PROVIDER_SITE_OTHER): Payer: Medicare Other

## 2017-10-05 DIAGNOSIS — I4891 Unspecified atrial fibrillation: Secondary | ICD-10-CM | POA: Diagnosis not present

## 2017-10-05 LAB — POCT INR: INR: 2 (ref 2.0–3.0)

## 2017-10-13 ENCOUNTER — Other Ambulatory Visit: Payer: Self-pay

## 2017-10-13 DIAGNOSIS — E782 Mixed hyperlipidemia: Secondary | ICD-10-CM

## 2017-10-13 MED ORDER — FENOFIBRATE 145 MG PO TABS: 145.0000 mg | ORAL_TABLET | Freq: Every day | ORAL | 4 refills | Status: DC

## 2017-10-13 MED ORDER — LEVOTHYROXINE SODIUM 50 MCG PO TABS: 50.0000 ug | ORAL_TABLET | Freq: Every day | ORAL | 3 refills | Status: DC

## 2017-11-02 ENCOUNTER — Ambulatory Visit: Payer: Self-pay

## 2017-11-04 ENCOUNTER — Ambulatory Visit: Payer: Self-pay

## 2017-11-11 ENCOUNTER — Ambulatory Visit (INDEPENDENT_AMBULATORY_CARE_PROVIDER_SITE_OTHER): Payer: Medicare Other

## 2017-11-11 DIAGNOSIS — I4891 Unspecified atrial fibrillation: Secondary | ICD-10-CM | POA: Diagnosis not present

## 2017-11-11 DIAGNOSIS — Z23 Encounter for immunization: Secondary | ICD-10-CM | POA: Diagnosis not present

## 2017-11-11 LAB — POCT INR: INR: 3.2 — AB (ref 2.0–3.0)

## 2017-11-11 NOTE — Progress Notes (Signed)
Stay on same regimen of medication , recheck one month per [place]

## 2017-12-07 ENCOUNTER — Encounter: Payer: Self-pay | Admitting: Adult Health

## 2017-12-07 ENCOUNTER — Ambulatory Visit (INDEPENDENT_AMBULATORY_CARE_PROVIDER_SITE_OTHER): Payer: Medicare Other | Admitting: Adult Health

## 2017-12-07 VITALS — BP 142/96 | HR 85 | Resp 16 | Ht <= 58 in | Wt 108.0 lb

## 2017-12-07 DIAGNOSIS — J439 Emphysema, unspecified: Secondary | ICD-10-CM

## 2017-12-07 DIAGNOSIS — G894 Chronic pain syndrome: Secondary | ICD-10-CM

## 2017-12-07 DIAGNOSIS — Z79899 Other long term (current) drug therapy: Secondary | ICD-10-CM | POA: Diagnosis not present

## 2017-12-07 DIAGNOSIS — F17210 Nicotine dependence, cigarettes, uncomplicated: Secondary | ICD-10-CM | POA: Diagnosis not present

## 2017-12-07 DIAGNOSIS — I4891 Unspecified atrial fibrillation: Secondary | ICD-10-CM

## 2017-12-07 DIAGNOSIS — I10 Essential (primary) hypertension: Secondary | ICD-10-CM

## 2017-12-07 LAB — POCT INR: INR: 2.4 (ref 2.0–3.0)

## 2017-12-07 MED ORDER — HYDROCODONE-ACETAMINOPHEN 5-325 MG PO TABS: 1.0000 | ORAL_TABLET | Freq: Three times a day (TID) | ORAL | 0 refills | Status: DC | PRN

## 2017-12-07 NOTE — Progress Notes (Signed)
[facility] [place], [place] [address]  Internal MEDICINE  Office Visit Note  Patient Name: Teresa Holmes  [id number]  [id number]  Date of Service: 12/07/2017  Chief Complaint  Patient presents with  . Atrial Fibrillation    HPI  Pt here for follow up on Atrial fibrillation, HTN, copd, and chronic pain.  Patient denies any new or current issues.  Her INR today is 2.3 and it appears that her Coumadin dose is therapeutic.  She continues to report chronic pain is requesting a refill her Vicodin.   Current Medication: Outpatient Encounter Medications as of 12/07/2017  Medication Sig  . albuterol (VENTOLIN HFA) 108 (90 Base) MCG/ACT inhaler Inhale 2 puffs into the lungs every 4 (four) hours as needed for wheezing or shortness of breath.  Marland Kitchen arformoterol (BROVANA) 15 MCG/2ML NEBU Take 15 mcg by nebulization 2 (two) times daily.  Marland Kitchen atorvastatin (LIPITOR) 40 MG tablet Take 1 tablet (40 mg total) by mouth daily.  . diazepam (VALIUM) 2 MG tablet Take by mouth.  . fenofibrate (TRICOR) 145 MG tablet Take 1 tablet (145 mg total) by mouth daily.  Marland Kitchen HYDROcodone-acetaminophen (NORCO) 5-325 MG tablet Take 1 tablet by mouth 3 (three) times daily as needed for moderate pain.  Marland Kitchen levothyroxine (SYNTHROID, LEVOTHROID) 50 MCG tablet Take 1 tablet (50 mcg total) by mouth daily before breakfast.  . nitrofurantoin, macrocrystal-monohydrate, (MACROBID) 100 MG capsule Take 1 capsule (100 mg total) by mouth 2 (two) times daily.  Marland Kitchen warfarin (COUMADIN) 2 MG tablet Take 1 tablet (2 mg total) by mouth daily.  Marland Kitchen warfarin (COUMADIN) 3 MG tablet Take by mouth.  . [DISCONTINUED] HYDROcodone-acetaminophen (NORCO) 5-325 MG tablet Take 1 tablet by mouth 3 (three) times daily as needed for moderate pain.   No facility-administered encounter medications on file as of 12/07/2017.     Surgical History: Past Surgical History:  Procedure Laterality Date  . ABDOMINAL HYSTERECTOMY    . AORTIC/RENAL  BYPASS    . bypass graft      Medical History: Past Medical History:  Diagnosis Date  . Aneurysm ([place])   . Anxiety   . Arthritis   . Cancer ([place])   . CHF (congestive heart failure) ([place])   . COPD (chronic obstructive pulmonary disease) ([place])   . Hyperlipidemia   . Hypertension   . Stroke ([place])   . Thyroid disease     Family History: Family History  Problem Relation Age of Onset  . Cancer Mother     Social History   Socioeconomic History  . Marital status: Married    Spouse name: Not on file  . Number of children: Not on file  . Years of education: Not on file  . Highest education level: Not on file  Occupational History  . Not on file  Social Needs  . Financial resource strain: Not on file  . Food insecurity:    Worry: Not on file    Inability: Not on file  . Transportation needs:    Medical: Not on file    Non-medical: Not on file  Tobacco Use  . Smoking status: Current Every Day Smoker  . Smokeless tobacco: Never Used  Substance and Sexual Activity  . Alcohol use: No  . Drug use: Never  . Sexual activity: Not on file  Lifestyle  . Physical activity:    Days per week: Not on file    Minutes per session: Not on file  . Stress: Not on file  Relationships  . Social connections:    Talks on phone: Not on file    Gets together: Not on file    Attends religious service: Not on file    Active member of club or organization: Not on file    Attends meetings of clubs or organizations: Not on file    Relationship status: Not on file  . Intimate partner violence:    Fear of current or ex partner: Not on file    Emotionally abused: Not on file    Physically abused: Not on file    Forced sexual activity: Not on file  Other Topics Concern  . Not on file  Social History Narrative  . Not on file      Review of Systems  Constitutional: Negative for chills, fatigue and unexpected weight change.  HENT: Negative for congestion, rhinorrhea, sneezing and sore  throat.   Eyes: Negative for photophobia, pain and redness.  Respiratory: Negative for cough, chest tightness and shortness of breath.   Cardiovascular: Negative for chest pain and palpitations.  Gastrointestinal: Negative for abdominal pain, constipation, diarrhea, nausea and vomiting.  Endocrine: Negative.   Genitourinary: Negative for dysuria and frequency.  Musculoskeletal: Negative for arthralgias, back pain, joint swelling and neck pain.  Skin: Negative for rash.  Allergic/Immunologic: Negative.   Neurological: Negative for tremors and numbness.  Hematological: Negative for adenopathy. Does not bruise/bleed easily.  Psychiatric/Behavioral: Negative for behavioral problems and sleep disturbance. The patient is not nervous/anxious.     Vital Signs: BP (!) 142/96 (BP Location: Right Arm, Patient Position: Sitting, Cuff Size: Normal)   Pulse 85   Resp 16   Ht 4\' 7"  (1.397 m)   Wt 108 lb (49 kg)   SpO2 96%   BMI 25.10 kg/m    Physical Exam  Constitutional: She is oriented to person, place, and time. She appears well-developed and well-nourished. No distress.  HENT:  Head: Normocephalic and atraumatic.  Mouth/Throat: Oropharynx is clear and moist. No oropharyngeal exudate.  Eyes: Pupils are equal, round, and reactive to light. EOM are normal.  Neck: Normal range of motion. Neck supple. No JVD present. No tracheal deviation present. No thyromegaly present.  Cardiovascular: Normal rate, regular rhythm and normal heart sounds. Exam reveals no gallop and no friction rub.  No murmur heard. Pulmonary/Chest: Effort normal and breath sounds normal. No respiratory distress. She has no wheezes. She has no rales. She exhibits no tenderness.  Abdominal: Soft. There is no tenderness. There is no guarding.  Musculoskeletal: Normal range of motion.  Lymphadenopathy:    She has no cervical adenopathy.  Neurological: She is alert and oriented to person, place, and time. No cranial nerve  deficit.  Skin: Skin is warm and dry. She is not diaphoretic.  Psychiatric: She has a normal mood and affect. Her behavior is normal. Judgment and thought content normal.  Nursing note and vitals reviewed.   Assessment/Plan: 1. Atrial fibrillation, unspecified type ([place]) Followed by cardiology, continue coumadin as directed.  - POCT INR  2. Encounter for long-term (current) use of medications Continue coumadin as prescribed.   3. Essential (primary) hypertension Pt's blood pressure medications as directed. Slightly elevated at this time. Will continue to monitor.   4. Chronic pain disorder Refilled patients norco. - HYDROcodone-acetaminophen (NORCO) 5-325 MG tablet; Take 1 tablet by mouth 3 (three) times daily as needed for moderate pain.  Dispense: 90 tablet; Refill: 0  5. Pulmonary emphysema, unspecified emphysema type ([place]) Pt needs portable nebulizer, her current  one from [place] is to heavy and she can not take it when she travels.  - DME Nebulizer machine  6. Nicotine dependence, cigarettes, uncomplicated Pt continues to smoke 10-15 cigarettes daily.  Smoking cessation counseling: 1. Pt acknowledges the risks of long term smoking, she will try to quite smoking. 2. Options for different medications including nicotine products, chewing gum, patch etc, Wellbutrin and Chantix is discussed 3. Goal and date of compete cessation is discussed 4. Total time spent in smoking cessation is 15 min.  General Counseling: Denijah verbalizes understanding of the findings of todays visit and agrees with plan of treatment. I have discussed any further diagnostic evaluation that may be needed or ordered today. We also reviewed her medications today. she has been encouraged to call the office with any questions or concerns that should arise related to todays visit.    Orders Placed This Encounter  Procedures  . DME Nebulizer machine  . POCT INR    Meds ordered this encounter  Medications   . HYDROcodone-acetaminophen (NORCO) 5-325 MG tablet    Sig: Take 1 tablet by mouth 3 (three) times daily as needed for moderate pain.    Dispense:  90 tablet    Refill:  0    Fill after 05/31/2017    Time spent:25 Minutes   This patient was seen by Orson Gear AGNP-C in Collaboration with Dr Lavera Guise as a part of collaborative care agreement     Kendell Bane AGNP-C Internal medicine

## 2017-12-07 NOTE — Patient Instructions (Signed)

## 2017-12-08 ENCOUNTER — Telehealth: Payer: Self-pay | Admitting: Adult Health

## 2017-12-08 NOTE — Telephone Encounter (Signed)
Contacted [facility] regarding orders for nebulizer.

## 2018-01-04 ENCOUNTER — Ambulatory Visit: Payer: Self-pay

## 2018-01-06 ENCOUNTER — Ambulatory Visit: Payer: Self-pay

## 2018-02-03 ENCOUNTER — Telehealth: Payer: Self-pay

## 2018-02-03 DIAGNOSIS — E782 Mixed hyperlipidemia: Secondary | ICD-10-CM

## 2018-02-03 MED ORDER — ATORVASTATIN CALCIUM 40 MG PO TABS: 40.0000 mg | ORAL_TABLET | Freq: Every day | ORAL | 4 refills | Status: DC

## 2018-02-03 MED ORDER — FENOFIBRATE 145 MG PO TABS: 145.0000 mg | ORAL_TABLET | Freq: Every day | ORAL | 4 refills | Status: DC

## 2018-02-03 NOTE — Telephone Encounter (Signed)
Pt called need refills on atorvastatin and tricor and esend to [facility]

## 2018-02-10 ENCOUNTER — Ambulatory Visit (INDEPENDENT_AMBULATORY_CARE_PROVIDER_SITE_OTHER): Payer: Medicare Other

## 2018-02-10 DIAGNOSIS — I4891 Unspecified atrial fibrillation: Secondary | ICD-10-CM | POA: Diagnosis not present

## 2018-02-10 LAB — POCT INR: INR: 2.5 (ref 2.0–3.0)

## 2018-02-13 ENCOUNTER — Telehealth: Payer: Self-pay

## 2018-02-13 ENCOUNTER — Other Ambulatory Visit: Payer: Self-pay | Admitting: Nurse Practitioner

## 2018-02-13 DIAGNOSIS — G894 Chronic pain syndrome: Secondary | ICD-10-CM

## 2018-02-13 MED ORDER — HYDROCODONE-ACETAMINOPHEN 5-325 MG PO TABS: 1.0000 | ORAL_TABLET | Freq: Three times a day (TID) | ORAL | 0 refills | Status: DC | PRN

## 2018-02-13 NOTE — Progress Notes (Signed)
Sent single 30 day prescription for patient's hydrocodone to [facility] [address]. She has visit 03/09/2018 for further refills.

## 2018-02-13 NOTE — Telephone Encounter (Signed)
Sent single 30 day prescription for patient's hydrocodone to [facility] [address]. She has visit 03/09/2018 for further refills.

## 2018-02-13 NOTE — Telephone Encounter (Signed)
Called pt and informed her of her refill for hydrocodone and that she will get further refills at her next visit on February.  TLW

## 2018-03-09 ENCOUNTER — Ambulatory Visit: Payer: Self-pay | Admitting: Nurse Practitioner

## 2018-03-13 ENCOUNTER — Ambulatory Visit: Payer: Self-pay | Admitting: Nurse Practitioner

## 2018-03-20 ENCOUNTER — Other Ambulatory Visit: Payer: Self-pay

## 2018-03-20 MED ORDER — LEVOTHYROXINE SODIUM 50 MCG PO TABS: 50.0000 ug | ORAL_TABLET | Freq: Every day | ORAL | 3 refills | Status: DC

## 2018-03-21 ENCOUNTER — Ambulatory Visit: Payer: Self-pay | Admitting: Nurse Practitioner

## 2018-03-23 ENCOUNTER — Encounter: Payer: Self-pay | Admitting: Nurse Practitioner

## 2018-03-23 ENCOUNTER — Ambulatory Visit (INDEPENDENT_AMBULATORY_CARE_PROVIDER_SITE_OTHER): Payer: Medicare Other | Admitting: Nurse Practitioner

## 2018-03-23 VITALS — BP 169/83 | HR 86 | Resp 16 | Ht <= 58 in | Wt 116.0 lb

## 2018-03-23 DIAGNOSIS — E782 Mixed hyperlipidemia: Secondary | ICD-10-CM

## 2018-03-23 DIAGNOSIS — I4891 Unspecified atrial fibrillation: Secondary | ICD-10-CM | POA: Diagnosis not present

## 2018-03-23 DIAGNOSIS — Z79899 Other long term (current) drug therapy: Secondary | ICD-10-CM

## 2018-03-23 DIAGNOSIS — G894 Chronic pain syndrome: Secondary | ICD-10-CM | POA: Diagnosis not present

## 2018-03-23 DIAGNOSIS — E039 Hypothyroidism, unspecified: Secondary | ICD-10-CM

## 2018-03-23 LAB — POCT INR: INR: 2.2 (ref 2.0–3.0)

## 2018-03-23 MED ORDER — HYDROCODONE-ACETAMINOPHEN 5-325 MG PO TABS: 1.0000 | ORAL_TABLET | Freq: Three times a day (TID) | ORAL | 0 refills | Status: DC | PRN

## 2018-03-23 MED ORDER — LEVOTHYROXINE SODIUM 50 MCG PO TABS: 50.0000 ug | ORAL_TABLET | Freq: Every day | ORAL | 3 refills | Status: DC

## 2018-03-23 MED ORDER — ATORVASTATIN CALCIUM 40 MG PO TABS: 40.0000 mg | ORAL_TABLET | Freq: Every day | ORAL | 3 refills | Status: DC

## 2018-03-23 MED ORDER — FENOFIBRATE 145 MG PO TABS: 145.0000 mg | ORAL_TABLET | Freq: Every day | ORAL | 3 refills | Status: DC

## 2018-03-23 NOTE — Progress Notes (Signed)
[facility] [place], [place] [address]  Internal MEDICINE  Office Visit Note  Patient Name: Teresa Holmes  [id number]  [id number]  Date of Service: 04/03/2018  Chief Complaint  Patient presents with  . Medical Management of Chronic Issues    3 month follow up, pt stays cold alot, pt having some sinus drainage  . Hyperlipidemia  . Hypertension  . Fall    pt had a fall on tuesday and was in the floor for two days, pt has a swollen left hand and bruised,    The patient is here for routine follow up exam. She states that she fell a few days ago, hitting her left arm and left side of her face on the steel bed frame. Her left hand is very swollen and cold to touch. This hand is already weak and has difficulty moving it after a previous stroke. Continues to have chronic left knee pain after fracture that did not heal correctly. Patient states that she is doing ok, and does not wish to have x-ray at this time. Does take hydrocodone/APAP p=5/325mg  twice daily when needed. INR checked today and is 2.2.       Current Medication: Outpatient Encounter Medications as of 03/23/2018  Medication Sig  . albuterol (VENTOLIN HFA) 108 (90 Base) MCG/ACT inhaler Inhale 2 puffs into the lungs every 4 (four) hours as needed for wheezing or shortness of breath.  Marland Kitchen arformoterol (BROVANA) 15 MCG/2ML NEBU Take 15 mcg by nebulization 2 (two) times daily.  Marland Kitchen atorvastatin (LIPITOR) 40 MG tablet Take 1 tablet (40 mg total) by mouth daily.  . diazepam (VALIUM) 2 MG tablet Take by mouth.  . fenofibrate (TRICOR) 145 MG tablet Take 1 tablet (145 mg total) by mouth daily.  Marland Kitchen HYDROcodone-acetaminophen (NORCO) 5-325 MG tablet Take 1 tablet by mouth 3 (three) times daily as needed for moderate pain.  Marland Kitchen levothyroxine (SYNTHROID, LEVOTHROID) 50 MCG tablet Take 1 tablet (50 mcg total) by mouth daily before breakfast.  . nitrofurantoin, macrocrystal-monohydrate, (MACROBID) 100 MG capsule Take 1  capsule (100 mg total) by mouth 2 (two) times daily.  Marland Kitchen warfarin (COUMADIN) 2 MG tablet Take 1 tablet (2 mg total) by mouth daily.  Marland Kitchen warfarin (COUMADIN) 3 MG tablet Take by mouth.  . [DISCONTINUED] atorvastatin (LIPITOR) 40 MG tablet Take 1 tablet (40 mg total) by mouth daily.  . [DISCONTINUED] fenofibrate (TRICOR) 145 MG tablet Take 1 tablet (145 mg total) by mouth daily.  . [DISCONTINUED] HYDROcodone-acetaminophen (NORCO) 5-325 MG tablet Take 1 tablet by mouth 3 (three) times daily as needed for moderate pain.  . [DISCONTINUED] HYDROcodone-acetaminophen (NORCO) 5-325 MG tablet Take 1 tablet by mouth 3 (three) times daily as needed for moderate pain.  . [DISCONTINUED] levothyroxine (SYNTHROID, LEVOTHROID) 50 MCG tablet Take 1 tablet (50 mcg total) by mouth daily before breakfast.   No facility-administered encounter medications on file as of 03/23/2018.     Surgical History: Past Surgical History:  Procedure Laterality Date  . ABDOMINAL HYSTERECTOMY    . AORTIC/RENAL BYPASS    . bypass graft      Medical History: Past Medical History:  Diagnosis Date  . Aneurysm ([place])   . Anxiety   . Arthritis   . Cancer ([place])   . CHF (congestive heart failure) ([place])   . COPD (chronic obstructive pulmonary disease) ([place])   . Hyperlipidemia   . Hypertension   . Stroke ([place])   . Thyroid disease     Family History:  Family History  Problem Relation Age of Onset  . Cancer Mother     Social History   Socioeconomic History  . Marital status: Married    Spouse name: Not on file  . Number of children: Not on file  . Years of education: Not on file  . Highest education level: Not on file  Occupational History  . Not on file  Social Needs  . Financial resource strain: Not on file  . Food insecurity:    Worry: Not on file    Inability: Not on file  . Transportation needs:    Medical: Not on file    Non-medical: Not on file  Tobacco Use  . Smoking status: Current Every Day Smoker  .  Smokeless tobacco: Never Used  Substance and Sexual Activity  . Alcohol use: No  . Drug use: Never  . Sexual activity: Not on file  Lifestyle  . Physical activity:    Days per week: Not on file    Minutes per session: Not on file  . Stress: Not on file  Relationships  . Social connections:    Talks on phone: Not on file    Gets together: Not on file    Attends religious service: Not on file    Active member of club or organization: Not on file    Attends meetings of clubs or organizations: Not on file    Relationship status: Not on file  . Intimate partner violence:    Fear of current or ex partner: Not on file    Emotionally abused: Not on file    Physically abused: Not on file    Forced sexual activity: Not on file  Other Topics Concern  . Not on file  Social History Narrative  . Not on file      Review of Systems  Constitutional: Positive for activity change. Negative for chills, fatigue and unexpected weight change.  HENT: Negative for congestion, postnasal drip, rhinorrhea, sneezing and sore throat.   Eyes: Negative for redness.  Respiratory: Positive for shortness of breath. Negative for cough and chest tightness.   Cardiovascular: Negative for chest pain and palpitations.  Gastrointestinal: Negative for abdominal pain, constipation, diarrhea, nausea and vomiting.  Endocrine:       Well controlled thyroid panel   Genitourinary: Negative.  Negative for dysuria and frequency.  Musculoskeletal: Positive for arthralgias and gait problem. Negative for back pain, joint swelling and neck pain.       Left knee and left leg pain after fall.   Skin: Negative for rash.  Allergic/Immunologic: Positive for environmental allergies.  Neurological: Negative for tremors and numbness.       Wekness of left upper and lower extremities after suffering stroke several years ago.   Hematological: Negative for adenopathy. Does not bruise/bleed easily.  Psychiatric/Behavioral: Negative  for behavioral problems (Depression), sleep disturbance and suicidal ideas. The patient is nervous/anxious.     Today's Vitals   03/23/18 1017  BP: (!) 169/83  Pulse: 86  Resp: 16  SpO2: 98%  Weight: 116 lb (52.6 kg)  Height: 4\' 9"  (1.448 m)   Body mass index is 25.1 kg/m.  Physical Exam Vitals signs and nursing note reviewed.  Constitutional:      General: She is not in acute distress.    Appearance: Normal appearance. She is well-developed. She is not diaphoretic.  HENT:     Head: Normocephalic and atraumatic.     Mouth/Throat:     Pharynx: No oropharyngeal  exudate.  Eyes:     Pupils: Pupils are equal, round, and reactive to light.  Neck:     Musculoskeletal: Normal range of motion and neck supple.     Thyroid: No thyromegaly.     Vascular: No carotid bruit or JVD.     Trachea: No tracheal deviation.  Cardiovascular:     Rate and Rhythm: Normal rate. Rhythm irregular.     Heart sounds: Normal heart sounds. No murmur. No friction rub. No gallop.   Pulmonary:     Effort: Pulmonary effort is normal. No respiratory distress.     Breath sounds: No wheezing or rales.     Comments: Diminished, but equal breath sounds bilaterally.  Chest:     Chest wall: No tenderness.  Abdominal:     General: Bowel sounds are normal.     Palpations: Abdomen is soft.     Tenderness: There is no abdominal tenderness.  Musculoskeletal:     Comments: Left hip and knee pain. Wearing knee brace on left knee. Using a cane to help with ambulation and balance .  Lymphadenopathy:     Cervical: No cervical adenopathy.  Skin:    General: Skin is warm and dry.  Neurological:     Mental Status: She is alert and oriented to person, place, and time.     Cranial Nerves: No cranial nerve deficit.     Comments: Left upper extremity weakness. She is at her neurological baseline .  Psychiatric:        Mood and Affect: Mood is anxious.        Speech: Speech normal.        Behavior: Behavior normal.         Thought Content: Thought content normal.        Judgment: Judgment normal.    Assessment/Plan: 1. Acquired hypothyroidism Continue levothyroxine at current dose - levothyroxine (SYNTHROID, LEVOTHROID) 50 MCG tablet; Take 1 tablet (50 mcg total) by mouth daily before breakfast.  Dispense: 90 tablet; Refill: 3  2. Mixed hyperlipidemia Continue atorvastatin and fenofibrate at current doses.  - fenofibrate (TRICOR) 145 MG tablet; Take 1 tablet (145 mg total) by mouth daily.  Dispense: 90 tablet; Refill: 3 - atorvastatin (LIPITOR) 40 MG tablet; Take 1 tablet (40 mg total) by mouth daily.  Dispense: 90 tablet; Refill: 3  3. Chronic pain disorder May continue to take hydrocodone/APAP 5/325mg  tablets twice daily if needed for pain. Two  30 day prescriptions provided today. Dates are 03/23/2018 and 04/20/2018.  - HYDROcodone-acetaminophen (NORCO) 5-325 MG tablet; Take 1 tablet by mouth 3 (three) times daily as needed for moderate pain.  Dispense: 90 tablet; Refill: 0  4. Atrial fibrillation, unspecified type ([place]) Chronic warfarin therapy.  - POCT INR  5. Encounter for long-term (current) use of medications INR 2.2 today. Continue warfarin therapy as prescribed   General Counseling: Hawley verbalizes understanding of the findings of todays visit and agrees with plan of treatment. I have discussed any further diagnostic evaluation that may be needed or ordered today. We also reviewed her medications today. she has been encouraged to call the office with any questions or concerns that should arise related to todays visit.  Reviewed risks and possible side effects associated with taking opiates, benzodiazepines and other CNS depressants. Combination of these could cause dizziness and drowsiness. Advised patient not to drive or operate machinery when taking these medications, as patient's and other's life can be at risk and will have consequences. Patient verbalized understanding in  this matter.  Dependence and abuse for these drugs will be monitored closely. A Controlled substance policy and procedure is on file which allows [facility] to order a urine drug screen test at any visit. Patient understands and agrees with the plan  Hypertension Counseling:   The following hypertensive lifestyle modification were recommended and discussed:  1. Limiting alcohol intake to less than 1 oz/day of ethanol:(24 oz of beer or 8 oz of wine or 2 oz of 100-proof whiskey). 2. Take baby ASA 81 mg daily. 3. Importance of regular aerobic exercise and losing weight. 4. Reduce dietary saturated fat and cholesterol intake for overall cardiovascular health. 5. Maintaining adequate dietary potassium, calcium, and magnesium intake. 6. Regular monitoring of the blood pressure. 7. Reduce sodium intake to less than 100 mmol/day (less than 2.3 gm of sodium or less than 6 gm of sodium choride)   This patient was seen by [place] with Dr Lavera Guise as a part of collaborative care agreement  Orders Placed This Encounter  Procedures  . POCT INR    Meds ordered this encounter  Medications  . levothyroxine (SYNTHROID, LEVOTHROID) 50 MCG tablet    Sig: Take 1 tablet (50 mcg total) by mouth daily before breakfast.    Dispense:  90 tablet    Refill:  3    Order Specific Question:   Supervising Provider    Answer:   Lavera Guise [place]  . DISCONTD: HYDROcodone-acetaminophen (NORCO) 5-325 MG tablet    Sig: Take 1 tablet by mouth 3 (three) times daily as needed for moderate pain.    Dispense:  90 tablet    Refill:  0    Order Specific Question:   Supervising Provider    Answer:   Lavera Guise [[id number]]  . HYDROcodone-acetaminophen (NORCO) 5-325 MG tablet    Sig: Take 1 tablet by mouth 3 (three) times daily as needed for moderate pain.    Dispense:  90 tablet    Refill:  0    Fill after 04/20/2018    Order Specific Question:   Supervising Provider    Answer:   Lavera Guise  [[id number]]  . fenofibrate (TRICOR) 145 MG tablet    Sig: Take 1 tablet (145 mg total) by mouth daily.    Dispense:  90 tablet    Refill:  3    Order Specific Question:   Supervising Provider    Answer:   Lavera Guise [[id number]]  . atorvastatin (LIPITOR) 40 MG tablet    Sig: Take 1 tablet (40 mg total) by mouth daily.    Dispense:  90 tablet    Refill:  3    Order Specific Question:   Supervising Provider    Answer:   Lavera Guise [[id number]]    Time spent: 70 Minutes      Dr Lavera Guise Internal medicine

## 2018-05-24 ENCOUNTER — Other Ambulatory Visit: Payer: Self-pay

## 2018-05-24 MED ORDER — ALBUTEROL SULFATE HFA 108 (90 BASE) MCG/ACT IN AERS: 2.0000 | INHALATION_SPRAY | RESPIRATORY_TRACT | 3 refills | Status: DC | PRN

## 2018-05-26 ENCOUNTER — Other Ambulatory Visit: Payer: Self-pay

## 2018-05-26 ENCOUNTER — Ambulatory Visit: Payer: Self-pay | Admitting: Nurse Practitioner

## 2018-05-26 ENCOUNTER — Ambulatory Visit (INDEPENDENT_AMBULATORY_CARE_PROVIDER_SITE_OTHER): Payer: Medicare Other

## 2018-05-26 DIAGNOSIS — I4891 Unspecified atrial fibrillation: Secondary | ICD-10-CM | POA: Diagnosis not present

## 2018-05-26 LAB — POCT INR: INR: 3.1 — AB (ref 2.0–3.0)

## 2018-05-26 NOTE — Progress Notes (Signed)
Pt came in for INR check INR 3.1 PT 37.4 Per heather pt continue same routine (take 1 tablet 2mg  total daily) return in 1 month.

## 2018-06-19 ENCOUNTER — Other Ambulatory Visit: Payer: Self-pay

## 2018-06-19 DIAGNOSIS — E782 Mixed hyperlipidemia: Secondary | ICD-10-CM

## 2018-06-19 DIAGNOSIS — E039 Hypothyroidism, unspecified: Secondary | ICD-10-CM

## 2018-06-19 MED ORDER — LEVOTHYROXINE SODIUM 50 MCG PO TABS: 50.0000 ug | ORAL_TABLET | Freq: Every day | ORAL | 3 refills | Status: DC

## 2018-06-19 MED ORDER — ATORVASTATIN CALCIUM 40 MG PO TABS: 40.0000 mg | ORAL_TABLET | Freq: Every day | ORAL | 3 refills | Status: DC

## 2018-07-04 ENCOUNTER — Other Ambulatory Visit: Payer: Self-pay

## 2018-07-04 DIAGNOSIS — E782 Mixed hyperlipidemia: Secondary | ICD-10-CM

## 2018-07-04 MED ORDER — ATORVASTATIN CALCIUM 40 MG PO TABS: 40.0000 mg | ORAL_TABLET | Freq: Every day | ORAL | 3 refills | Status: DC

## 2018-07-25 ENCOUNTER — Other Ambulatory Visit: Payer: Self-pay

## 2018-07-25 MED ORDER — ARFORMOTEROL TARTRATE 15 MCG/2ML IN NEBU: 15.0000 ug | INHALATION_SOLUTION | Freq: Two times a day (BID) | RESPIRATORY_TRACT | 3 refills | Status: DC

## 2018-08-09 ENCOUNTER — Ambulatory Visit: Payer: Medicare Other

## 2018-08-10 ENCOUNTER — Ambulatory Visit (INDEPENDENT_AMBULATORY_CARE_PROVIDER_SITE_OTHER): Payer: Medicare Other

## 2018-08-10 ENCOUNTER — Other Ambulatory Visit: Payer: Self-pay

## 2018-08-10 DIAGNOSIS — I4891 Unspecified atrial fibrillation: Secondary | ICD-10-CM

## 2018-08-10 LAB — POCT INR: INR: 2.7 (ref 2.0–3.0)

## 2018-08-10 NOTE — Progress Notes (Signed)
Pt here for INR Per Adam continue same routine as before 2mg  tablets daily and return for follow up in one month.

## 2018-08-11 ENCOUNTER — Ambulatory Visit: Payer: Medicare Other

## 2018-09-15 ENCOUNTER — Other Ambulatory Visit: Payer: Self-pay | Admitting: Adult Health

## 2018-09-15 DIAGNOSIS — I4891 Unspecified atrial fibrillation: Secondary | ICD-10-CM

## 2018-09-19 ENCOUNTER — Ambulatory Visit: Payer: Self-pay | Admitting: Nurse Practitioner

## 2018-09-28 ENCOUNTER — Ambulatory Visit: Payer: Medicare Other | Admitting: Nurse Practitioner

## 2018-09-28 ENCOUNTER — Inpatient Hospital Stay
Admission: EM | Admit: 2018-09-28 | Discharge: 2018-10-03 | DRG: 535 | Disposition: A | Discharge status: Skilled Nursing Facility | Payer: Medicare Other | Attending: Internal Medicine | Admitting: Internal Medicine

## 2018-09-28 ENCOUNTER — Other Ambulatory Visit: Payer: Self-pay

## 2018-09-28 ENCOUNTER — Emergency Department: Payer: Medicare Other

## 2018-09-28 DIAGNOSIS — F17210 Nicotine dependence, cigarettes, uncomplicated: Secondary | ICD-10-CM | POA: Diagnosis present

## 2018-09-28 DIAGNOSIS — Z03818 Encounter for observation for suspected exposure to other biological agents ruled out: Secondary | ICD-10-CM | POA: Diagnosis not present

## 2018-09-28 DIAGNOSIS — R296 Repeated falls: Secondary | ICD-10-CM | POA: Diagnosis present

## 2018-09-28 DIAGNOSIS — Z886 Allergy status to analgesic agent status: Secondary | ICD-10-CM

## 2018-09-28 DIAGNOSIS — R52 Pain, unspecified: Secondary | ICD-10-CM | POA: Diagnosis not present

## 2018-09-28 DIAGNOSIS — R000 Tachycardia, unspecified: Secondary | ICD-10-CM | POA: Diagnosis not present

## 2018-09-28 DIAGNOSIS — I2510 Atherosclerotic heart disease of native coronary artery without angina pectoris: Secondary | ICD-10-CM | POA: Diagnosis present

## 2018-09-28 DIAGNOSIS — Z66 Do not resuscitate: Secondary | ICD-10-CM | POA: Diagnosis present

## 2018-09-28 DIAGNOSIS — Z86718 Personal history of other venous thrombosis and embolism: Secondary | ICD-10-CM

## 2018-09-28 DIAGNOSIS — E038 Other specified hypothyroidism: Secondary | ICD-10-CM | POA: Diagnosis not present

## 2018-09-28 DIAGNOSIS — M1990 Unspecified osteoarthritis, unspecified site: Secondary | ICD-10-CM | POA: Diagnosis present

## 2018-09-28 DIAGNOSIS — R0902 Hypoxemia: Secondary | ICD-10-CM | POA: Diagnosis not present

## 2018-09-28 DIAGNOSIS — E039 Hypothyroidism, unspecified: Secondary | ICD-10-CM | POA: Diagnosis present

## 2018-09-28 DIAGNOSIS — G8929 Other chronic pain: Secondary | ICD-10-CM | POA: Diagnosis not present

## 2018-09-28 DIAGNOSIS — I69354 Hemiplegia and hemiparesis following cerebral infarction affecting left non-dominant side: Secondary | ICD-10-CM

## 2018-09-28 DIAGNOSIS — S3289XA Fracture of other parts of pelvis, initial encounter for closed fracture: Secondary | ICD-10-CM | POA: Diagnosis not present

## 2018-09-28 DIAGNOSIS — I480 Paroxysmal atrial fibrillation: Secondary | ICD-10-CM | POA: Diagnosis not present

## 2018-09-28 DIAGNOSIS — R262 Difficulty in walking, not elsewhere classified: Secondary | ICD-10-CM | POA: Diagnosis not present

## 2018-09-28 DIAGNOSIS — S3289XD Fracture of other parts of pelvis, subsequent encounter for fracture with routine healing: Secondary | ICD-10-CM | POA: Diagnosis not present

## 2018-09-28 DIAGNOSIS — M5440 Lumbago with sciatica, unspecified side: Secondary | ICD-10-CM | POA: Diagnosis not present

## 2018-09-28 DIAGNOSIS — W19XXXA Unspecified fall, initial encounter: Secondary | ICD-10-CM | POA: Diagnosis not present

## 2018-09-28 DIAGNOSIS — E559 Vitamin D deficiency, unspecified: Secondary | ICD-10-CM | POA: Diagnosis not present

## 2018-09-28 DIAGNOSIS — R778 Other specified abnormalities of plasma proteins: Secondary | ICD-10-CM

## 2018-09-28 DIAGNOSIS — Z9049 Acquired absence of other specified parts of digestive tract: Secondary | ICD-10-CM

## 2018-09-28 DIAGNOSIS — Z20828 Contact with and (suspected) exposure to other viral communicable diseases: Secondary | ICD-10-CM | POA: Diagnosis present

## 2018-09-28 DIAGNOSIS — R7989 Other specified abnormal findings of blood chemistry: Secondary | ICD-10-CM

## 2018-09-28 DIAGNOSIS — Z1159 Encounter for screening for other viral diseases: Secondary | ICD-10-CM | POA: Diagnosis not present

## 2018-09-28 DIAGNOSIS — Z882 Allergy status to sulfonamides status: Secondary | ICD-10-CM

## 2018-09-28 DIAGNOSIS — Z7901 Long term (current) use of anticoagulants: Secondary | ICD-10-CM | POA: Diagnosis not present

## 2018-09-28 DIAGNOSIS — Z86711 Personal history of pulmonary embolism: Secondary | ICD-10-CM

## 2018-09-28 DIAGNOSIS — J9601 Acute respiratory failure with hypoxia: Secondary | ICD-10-CM | POA: Diagnosis not present

## 2018-09-28 DIAGNOSIS — S32592A Other specified fracture of left pubis, initial encounter for closed fracture: Principal | ICD-10-CM | POA: Diagnosis present

## 2018-09-28 DIAGNOSIS — S3993XA Unspecified injury of pelvis, initial encounter: Secondary | ICD-10-CM | POA: Diagnosis not present

## 2018-09-28 DIAGNOSIS — I509 Heart failure, unspecified: Secondary | ICD-10-CM | POA: Diagnosis present

## 2018-09-28 DIAGNOSIS — K567 Ileus, unspecified: Secondary | ICD-10-CM | POA: Diagnosis not present

## 2018-09-28 DIAGNOSIS — W1830XA Fall on same level, unspecified, initial encounter: Secondary | ICD-10-CM | POA: Diagnosis present

## 2018-09-28 DIAGNOSIS — N179 Acute kidney failure, unspecified: Secondary | ICD-10-CM | POA: Diagnosis not present

## 2018-09-28 DIAGNOSIS — Z951 Presence of aortocoronary bypass graft: Secondary | ICD-10-CM

## 2018-09-28 DIAGNOSIS — R0781 Pleurodynia: Secondary | ICD-10-CM | POA: Diagnosis not present

## 2018-09-28 DIAGNOSIS — E1165 Type 2 diabetes mellitus with hyperglycemia: Secondary | ICD-10-CM | POA: Diagnosis not present

## 2018-09-28 DIAGNOSIS — Z90710 Acquired absence of both cervix and uterus: Secondary | ICD-10-CM

## 2018-09-28 DIAGNOSIS — R2689 Other abnormalities of gait and mobility: Secondary | ICD-10-CM | POA: Diagnosis not present

## 2018-09-28 DIAGNOSIS — S2242XA Multiple fractures of ribs, left side, initial encounter for closed fracture: Secondary | ICD-10-CM | POA: Diagnosis present

## 2018-09-28 DIAGNOSIS — Y92009 Unspecified place in unspecified non-institutional (private) residence as the place of occurrence of the external cause: Secondary | ICD-10-CM | POA: Diagnosis not present

## 2018-09-28 DIAGNOSIS — I10 Essential (primary) hypertension: Secondary | ICD-10-CM | POA: Diagnosis not present

## 2018-09-28 DIAGNOSIS — D62 Acute posthemorrhagic anemia: Secondary | ICD-10-CM | POA: Diagnosis present

## 2018-09-28 DIAGNOSIS — I959 Hypotension, unspecified: Secondary | ICD-10-CM | POA: Diagnosis not present

## 2018-09-28 DIAGNOSIS — M25552 Pain in left hip: Secondary | ICD-10-CM | POA: Diagnosis not present

## 2018-09-28 DIAGNOSIS — S2232XD Fracture of one rib, left side, subsequent encounter for fracture with routine healing: Secondary | ICD-10-CM | POA: Diagnosis not present

## 2018-09-28 DIAGNOSIS — I252 Old myocardial infarction: Secondary | ICD-10-CM

## 2018-09-28 DIAGNOSIS — L89213 Pressure ulcer of right hip, stage 3: Secondary | ICD-10-CM | POA: Diagnosis not present

## 2018-09-28 DIAGNOSIS — J449 Chronic obstructive pulmonary disease, unspecified: Secondary | ICD-10-CM | POA: Diagnosis present

## 2018-09-28 DIAGNOSIS — R739 Hyperglycemia, unspecified: Secondary | ICD-10-CM | POA: Diagnosis present

## 2018-09-28 DIAGNOSIS — S299XXA Unspecified injury of thorax, initial encounter: Secondary | ICD-10-CM | POA: Diagnosis not present

## 2018-09-28 DIAGNOSIS — R110 Nausea: Secondary | ICD-10-CM | POA: Diagnosis not present

## 2018-09-28 DIAGNOSIS — K5900 Constipation, unspecified: Secondary | ICD-10-CM

## 2018-09-28 DIAGNOSIS — Z859 Personal history of malignant neoplasm, unspecified: Secondary | ICD-10-CM

## 2018-09-28 DIAGNOSIS — M5135 Other intervertebral disc degeneration, thoracolumbar region: Secondary | ICD-10-CM | POA: Diagnosis not present

## 2018-09-28 DIAGNOSIS — Z7401 Bed confinement status: Secondary | ICD-10-CM | POA: Diagnosis not present

## 2018-09-28 DIAGNOSIS — Q762 Congenital spondylolisthesis: Secondary | ICD-10-CM | POA: Diagnosis not present

## 2018-09-28 DIAGNOSIS — I110 Hypertensive heart disease with heart failure: Secondary | ICD-10-CM | POA: Diagnosis present

## 2018-09-28 DIAGNOSIS — F17200 Nicotine dependence, unspecified, uncomplicated: Secondary | ICD-10-CM | POA: Diagnosis not present

## 2018-09-28 DIAGNOSIS — M6281 Muscle weakness (generalized): Secondary | ICD-10-CM | POA: Diagnosis not present

## 2018-09-28 DIAGNOSIS — Z79890 Hormone replacement therapy (postmenopausal): Secondary | ICD-10-CM

## 2018-09-28 DIAGNOSIS — F411 Generalized anxiety disorder: Secondary | ICD-10-CM | POA: Diagnosis present

## 2018-09-28 DIAGNOSIS — E782 Mixed hyperlipidemia: Secondary | ICD-10-CM | POA: Diagnosis present

## 2018-09-28 DIAGNOSIS — Z79899 Other long term (current) drug therapy: Secondary | ICD-10-CM

## 2018-09-28 DIAGNOSIS — S329XXA Fracture of unspecified parts of lumbosacral spine and pelvis, initial encounter for closed fracture: Secondary | ICD-10-CM | POA: Diagnosis present

## 2018-09-28 DIAGNOSIS — Z9181 History of falling: Secondary | ICD-10-CM | POA: Diagnosis not present

## 2018-09-28 DIAGNOSIS — R4020 Unspecified coma: Secondary | ICD-10-CM | POA: Diagnosis not present

## 2018-09-28 DIAGNOSIS — E860 Dehydration: Secondary | ICD-10-CM | POA: Diagnosis present

## 2018-09-28 DIAGNOSIS — M2550 Pain in unspecified joint: Secondary | ICD-10-CM | POA: Diagnosis not present

## 2018-09-28 DIAGNOSIS — I639 Cerebral infarction, unspecified: Secondary | ICD-10-CM | POA: Diagnosis not present

## 2018-09-28 LAB — CBC WITH DIFFERENTIAL/PLATELET
Abs Immature Granulocytes: 0.11 10*3/uL — ABNORMAL HIGH (ref 0.00–0.07)
Basophils Absolute: 0 10*3/uL (ref 0.0–0.1)
Basophils Relative: 0 %
Eosinophils Absolute: 0 10*3/uL (ref 0.0–0.5)
Eosinophils Relative: 0 %
HCT: 33.3 % — ABNORMAL LOW (ref 36.0–46.0)
Hemoglobin: 10.7 g/dL — ABNORMAL LOW (ref 12.0–15.0)
Immature Granulocytes: 1 %
Lymphocytes Relative: 3 %
Lymphs Abs: 0.6 10*3/uL — ABNORMAL LOW (ref 0.7–4.0)
MCH: 30.7 pg (ref 26.0–34.0)
MCHC: 32.1 g/dL (ref 30.0–36.0)
MCV: 95.7 fL (ref 80.0–100.0)
Monocytes Absolute: 1.1 10*3/uL — ABNORMAL HIGH (ref 0.1–1.0)
Monocytes Relative: 6 %
Neutro Abs: 16.9 10*3/uL — ABNORMAL HIGH (ref 1.7–7.7)
Neutrophils Relative %: 90 %
Platelets: 290 10*3/uL (ref 150–400)
RBC: 3.48 MIL/uL — ABNORMAL LOW (ref 3.87–5.11)
RDW: 14.6 % (ref 11.5–15.5)
WBC: 18.6 10*3/uL — ABNORMAL HIGH (ref 4.0–10.5)
nRBC: 0 % (ref 0.0–0.2)

## 2018-09-28 LAB — COMPREHENSIVE METABOLIC PANEL
ALT: 22 U/L (ref 0–44)
AST: 34 U/L (ref 15–41)
Albumin: 3.6 g/dL (ref 3.5–5.0)
Alkaline Phosphatase: 47 U/L (ref 38–126)
Anion gap: 15 (ref 5–15)
BUN: 33 mg/dL — ABNORMAL HIGH (ref 8–23)
CO2: 19 mmol/L — ABNORMAL LOW (ref 22–32)
Calcium: 8.8 mg/dL — ABNORMAL LOW (ref 8.9–10.3)
Chloride: 102 mmol/L (ref 98–111)
Creatinine, Ser: 1.51 mg/dL — ABNORMAL HIGH (ref 0.44–1.00)
GFR calc Af Amer: 40 mL/min — ABNORMAL LOW (ref >60)
GFR calc non Af Amer: 35 mL/min — ABNORMAL LOW (ref >60)
Glucose, Bld: 210 mg/dL — ABNORMAL HIGH (ref 70–99)
Potassium: 3.6 mmol/L (ref 3.5–5.1)
Sodium: 136 mmol/L (ref 135–145)
Total Bilirubin: 1.5 mg/dL — ABNORMAL HIGH (ref 0.3–1.2)
Total Protein: 6.2 g/dL — ABNORMAL LOW (ref 6.5–8.1)

## 2018-09-28 LAB — URINALYSIS, COMPLETE (UACMP) WITH MICROSCOPIC
Bilirubin Urine: NEGATIVE
Glucose, UA: NEGATIVE mg/dL
Ketones, ur: NEGATIVE mg/dL
Leukocytes,Ua: NEGATIVE
Nitrite: NEGATIVE
Protein, ur: NEGATIVE mg/dL
Specific Gravity, Urine: 1.009 (ref 1.005–1.030)
pH: 5 (ref 5.0–8.0)

## 2018-09-28 LAB — URINE DRUG SCREEN, QUALITATIVE (ARMC ONLY)
Amphetamines, Ur Screen: NOT DETECTED
Barbiturates, Ur Screen: NOT DETECTED
Benzodiazepine, Ur Scrn: POSITIVE — AB
Cannabinoid 50 Ng, Ur ~~LOC~~: NOT DETECTED
Cocaine Metabolite,Ur ~~LOC~~: NOT DETECTED
MDMA (Ecstasy)Ur Screen: POSITIVE — AB
Methadone Scn, Ur: NOT DETECTED
Opiate, Ur Screen: POSITIVE — AB
Phencyclidine (PCP) Ur S: NOT DETECTED
Tricyclic, Ur Screen: NOT DETECTED

## 2018-09-28 LAB — TROPONIN I (HIGH SENSITIVITY): Troponin I (High Sensitivity): 31 ng/L — ABNORMAL HIGH (ref <18)

## 2018-09-28 LAB — CK: Total CK: 363 U/L — ABNORMAL HIGH (ref 38–234)

## 2018-09-28 LAB — GLUCOSE, CAPILLARY: Glucose-Capillary: 202 mg/dL — ABNORMAL HIGH (ref 70–99)

## 2018-09-28 LAB — ETHANOL: Alcohol, Ethyl (B): <10 mg/dL (ref <10)

## 2018-09-28 MED ORDER — TRAZODONE HCL 50 MG PO TABS: 25.0000 mg | ORAL_TABLET | Freq: Every evening | ORAL | Status: DC | PRN

## 2018-09-28 MED ORDER — NITROFURANTOIN MONOHYD MACRO 100 MG PO CAPS: 100.0000 mg | ORAL_CAPSULE | Freq: Two times a day (BID) | ORAL | Status: DC

## 2018-09-28 MED ORDER — ONDANSETRON HCL 4 MG PO TABS: 4.0000 mg | ORAL_TABLET | Freq: Four times a day (QID) | ORAL | Status: DC | PRN

## 2018-09-28 MED ORDER — FENOFIBRATE 145 MG PO TABS
145.0000 mg | ORAL_TABLET | Freq: Every day | ORAL | Status: DC
  Administered 2018-10-02 – 2018-10-03 (×2): 145 mg via ORAL
  Filled 2018-09-28 (×9): qty 1

## 2018-09-28 MED ORDER — OXYCODONE HCL 5 MG PO TABS
5.0000 mg | ORAL_TABLET | ORAL | Status: DC | PRN
  Administered 2018-09-29: 5 mg via ORAL
  Filled 2018-09-28: qty 1

## 2018-09-28 MED ORDER — DIAZEPAM 2 MG PO TABS: 2.0000 mg | ORAL_TABLET | Freq: Every evening | ORAL | Status: DC | PRN

## 2018-09-28 MED ORDER — LEVOTHYROXINE SODIUM 50 MCG PO TABS
50.0000 ug | ORAL_TABLET | Freq: Every day | ORAL | Status: DC
  Administered 2018-09-29 – 2018-10-03 (×5): 50 ug via ORAL
  Filled 2018-09-28 (×5): qty 1

## 2018-09-28 MED ORDER — ALBUTEROL SULFATE (2.5 MG/3ML) 0.083% IN NEBU: 2.5000 mg | INHALATION_SOLUTION | RESPIRATORY_TRACT | Status: DC | PRN

## 2018-09-28 MED ORDER — ACETAMINOPHEN 325 MG PO TABS
650.0000 mg | ORAL_TABLET | Freq: Four times a day (QID) | ORAL | Status: DC | PRN
  Administered 2018-09-30 – 2018-10-02 (×2): 650 mg via ORAL
  Filled 2018-09-28 (×2): qty 2

## 2018-09-28 MED ORDER — ACETAMINOPHEN 650 MG RE SUPP: 650.0000 mg | Freq: Four times a day (QID) | RECTAL | Status: DC | PRN

## 2018-09-28 MED ORDER — WARFARIN SODIUM 3 MG PO TABS: 3.0000 mg | ORAL_TABLET | Freq: Every day | ORAL | Status: DC

## 2018-09-28 MED ORDER — ONDANSETRON HCL 4 MG/2ML IJ SOLN
4.0000 mg | Freq: Four times a day (QID) | INTRAMUSCULAR | Status: DC | PRN
  Administered 2018-09-29: 4 mg via INTRAVENOUS
  Filled 2018-09-28: qty 2

## 2018-09-28 MED ORDER — HYDROCODONE-ACETAMINOPHEN 5-325 MG PO TABS
1.0000 | ORAL_TABLET | Freq: Three times a day (TID) | ORAL | Status: DC | PRN
  Administered 2018-09-29 – 2018-10-02 (×8): 1 via ORAL
  Filled 2018-09-28 (×8): qty 1

## 2018-09-28 MED ORDER — ATORVASTATIN CALCIUM 20 MG PO TABS
40.0000 mg | ORAL_TABLET | Freq: Every day | ORAL | Status: DC
  Administered 2018-09-30 – 2018-10-02 (×3): 40 mg via ORAL
  Filled 2018-09-28 (×3): qty 2

## 2018-09-28 MED ORDER — SODIUM CHLORIDE 0.9 % IV SOLN
INTRAVENOUS | Status: DC
  Administered 2018-09-29 – 2018-09-30 (×3): via INTRAVENOUS

## 2018-09-28 MED ORDER — WARFARIN SODIUM 2 MG PO TABS: 2.0000 mg | ORAL_TABLET | Freq: Every day | ORAL | Status: DC

## 2018-09-28 NOTE — ED Notes (Signed)
Patient transported to X-ray 

## 2018-09-28 NOTE — ED Provider Notes (Signed)
[facility] Emergency Department Provider Note       Time seen: ----------------------------------------- 9:29 PM on 09/28/2018 -----------------------------------------   I have reviewed the triage vital signs and the nursing notes.  HISTORY   Chief Complaint Fall    HPI Teresa Holmes is a 68 y.o. female with a history of anxiety, arthritis, cancer, CHF, COPD, hyperlipidemia, hypertension who presents to the ED by EMS for frequent falls.  She was found by her brother-in-law after she had been on the floor of her home from a fall today for about 7 hours.  Patient does have some left-sided pain.  There is concerns about her medication compliance, as there were numerous bottles of pain medication in her home.  She was also hypoxic in route and placed on nonrebreather.  Past Medical History:  Diagnosis Date  . Aneurysm ([place])   . Anxiety   . Arthritis   . Cancer ([place])   . CHF (congestive heart failure) ([place])   . COPD (chronic obstructive pulmonary disease) ([place])   . Hyperlipidemia   . Hypertension   . Stroke ([place])   . Thyroid disease     Patient Active Problem List   Diagnosis Date Noted  . Encounter for long-term (current) use of medications 05/11/2017  . Chronic pain disorder 05/11/2017  . Generalized anxiety disorder 03/14/2017  . Essential (primary) hypertension 03/14/2017  . Hemiplegia and hemiparesis following cerebral infarction affecting left non-dominant side ([place]) 03/14/2017  . Vitamin D deficiency, unspecified 03/14/2017  . Personal history of venous thrombosis and embolism 03/14/2017  . Congenital spondylolisthesis 03/14/2017  . Nicotine dependence, cigarettes, uncomplicated [id number]  . Mixed hyperlipidemia 03/14/2017  . Long term (current) use of anticoagulants 03/14/2017  . Other intervertebral disc degeneration, thoracolumbar region 03/14/2017  . Lumbago with sciatica 03/14/2017  . Cardiac arrhythmia 03/14/2017  . Myalgia  03/14/2017  . Hypothyroidism 03/14/2017  . Difficulty walking 03/14/2017  . Left knee pain 03/14/2017    Past Surgical History:  Procedure Laterality Date  . ABDOMINAL HYSTERECTOMY    . AORTIC/RENAL BYPASS    . bypass graft      Allergies Aspirin and Sulfa antibiotics  Social History Social History   Tobacco Use  . Smoking status: Current Every Day Smoker  . Smokeless tobacco: Never Used  Substance Use Topics  . Alcohol use: No  . Drug use: Never   Review of Systems Constitutional: Negative for fever. Cardiovascular: Negative for chest pain. Respiratory: Negative for shortness of breath. Gastrointestinal: Negative for abdominal pain, vomiting and diarrhea. Musculoskeletal: Negative for back pain. Skin: Negative for rash. Neurological: Negative for headaches, positive for weakness  All systems negative/normal/unremarkable except as stated in the HPI  ____________________________________________   PHYSICAL EXAM:  VITAL SIGNS: ED Triage Vitals  Enc Vitals Group     BP      Pulse      Resp      Temp      Temp src      SpO2      Weight      Height      Head Circumference      Peak Flow      Pain Score      Pain Loc      Pain Edu?      Excl. in [place]?    Constitutional: Alert and oriented.  Disheveled, no distress Eyes: Conjunctivae are normal. Normal extraocular movements. ENT      Head: Normocephalic and atraumatic.  Nose: No congestion/rhinnorhea.      Mouth/Throat: Mucous membranes are moist.      Neck: No stridor. Cardiovascular: Rapid rate, irregular rhythm. No murmurs, rubs, or gallops. Respiratory: Mild wheezing bilaterally Gastrointestinal: Soft and nontender. Normal bowel sounds Musculoskeletal: Nontender with normal range of motion in extremities. No lower extremity tenderness nor edema. Neurologic:  Normal speech and language. No gross focal neurologic deficits are appreciated.  Skin:  Skin is warm, dry and intact. No rash  noted. Psychiatric: Mood and affect are normal. Speech and behavior are normal.  ____________________________________________  EKG: Interpreted by me.  Sinus tachycardia with a rate of 127 bpm, normal PR interval, normal QRS, normal axis, ST depressions are noted  ____________________________________________  ED COURSE:  As part of my medical decision making, I reviewed the following data within the [place] History obtained from family if available, nursing notes, old chart and ekg, as well as notes from prior ED visits. Patient presented for frequent falls and failure to thrive, we will assess with labs and imaging as indicated at this time.   Procedures  Teresa Holmes was evaluated in Emergency Department on 09/28/2018 for the symptoms described in the history of present illness. She was evaluated in the context of the global COVID-19 pandemic, which necessitated consideration that the patient might be at risk for infection with the SARS-CoV-2 virus that causes COVID-19. Institutional protocols and algorithms that pertain to the evaluation of patients at risk for COVID-19 are in a state of rapid change based on information released by regulatory bodies including the CDC and federal and state organizations. These policies and algorithms were followed during the patient's care in the ED.  ____________________________________________   LABS (pertinent positives/negatives)  Labs Reviewed  CBC WITH DIFFERENTIAL/PLATELET - Abnormal; Notable for the following components:      Result Value   WBC 18.6 (*)    RBC 3.48 (*)    Hemoglobin 10.7 (*)    HCT 33.3 (*)    Neutro Abs 16.9 (*)    Lymphs Abs 0.6 (*)    Monocytes Absolute 1.1 (*)    Abs Immature Granulocytes 0.11 (*)    All other components within normal limits  COMPREHENSIVE METABOLIC PANEL - Abnormal; Notable for the following components:   CO2 19 (*)    Glucose, Bld 210 (*)    BUN 33 (*)    Creatinine, Ser 1.51  (*)    Calcium 8.8 (*)    Total Protein 6.2 (*)    Total Bilirubin 1.5 (*)    GFR calc non Af Amer 35 (*)    GFR calc Af Amer 40 (*)    All other components within normal limits  URINALYSIS, COMPLETE (UACMP) WITH MICROSCOPIC - Abnormal; Notable for the following components:   Color, Urine YELLOW (*)    APPearance HAZY (*)    Hgb urine dipstick MODERATE (*)    Bacteria, UA RARE (*)    All other components within normal limits  CK - Abnormal; Notable for the following components:   Total CK 363 (*)    All other components within normal limits  GLUCOSE, CAPILLARY - Abnormal; Notable for the following components:   Glucose-Capillary 202 (*)    All other components within normal limits  TROPONIN I (HIGH SENSITIVITY) - Abnormal; Notable for the following components:   Troponin I (High Sensitivity) 31 (*)    All other components within normal limits  SARS CORONAVIRUS 2 (TAT 6-12 HRS)  ETHANOL  URINE DRUG SCREEN, QUALITATIVE ([facility] ONLY)  BLOOD GAS, VENOUS  PROTIME-INR  CBG MONITORING, ED    RADIOLOGY Images were viewed by me  CT head, chest x-ray, pelvis x-ray IMPRESSION: Large old right MCA infarct with encephalomalacia.  Chronic small vessel disease.  No acute intracranial abnormality. IMPRESSION: No visible acute displaced rib fracture or other acute osseous injury. No acute cardiopulmonary abnormality.  Prior CABG, cholecystectomy. IMPRESSION: Concern for possible nondisplaced left inferior pubic ramus fracture.  No femoral neck fracture. ____________________________________________   DIFFERENTIAL DIAGNOSIS   CVA, subdural, medication noncompliance, sepsis, dehydration, electrolyte abnormality  FINAL ASSESSMENT AND PLAN  Weakness, frequent falls, elevated troponin, elevated CK, leukocytosis, acute kidney injury, pelvic fracture   Plan: The patient had presented for frequent falls. Patient's labs revealed multiple abnormalities including acute kidney  injury, mildly elevated CK and troponin, leukocytosis of uncertain etiology. Patient's imaging was concerning for a pelvic fracture, no intracranial fracture or abnormality on chest x-ray was seen.  She also will need social work evaluation, I will discuss with the hospitalist for admission.   Laurence Aly, MD    Note: This note was generated in part or whole with voice recognition software. Voice recognition is usually quite accurate but there are transcription errors that can and very often do occur. I apologize for any typographical errors that were not detected and corrected.     Earleen Newport, MD 09/28/18 2329

## 2018-09-28 NOTE — H&P (Signed)
[place] [facility] [place] NAME: Teresa Holmes    MR#:  [id number]  DATE OF BIRTH:  [birth year]  DATE OF ADMISSION:  09/28/2018  PRIMARY CARE PHYSICIAN: Ronnell Freshwater, NP   REQUESTING/REFERRING PHYSICIAN: Lenise Arena, MD CHIEF COMPLAINT:   Chief Complaint  Patient presents with  . Fall  Left hip pain.  HISTORY OF PRESENT ILLNESS:  Teresa Holmes  is a 68 y.o. Caucasian female with a known history of hypertension, dyslipidemia, COPD and CHF and hypothyroidism, presented to the emergency room with acute onset of fall with subsequent left hip pain.  Patient was apparently found on the floor and has been there for about 8 hours.  She stated that she lost balance and her legs gave way.  She denied any paresthesias or focal muscle weakness or headache or dizziness or blurred vision, chest pain or palpitations prior or after her fall.  She denied any presyncope or syncope.  No fever or chills.  No cough or wheezing or shortness of breath.  No nausea vomiting or abdominal pain or diarrhea.  No dysuria, oliguria or hematuria or flank pain.  Upon presentation emergency room, heart rate was 127 with otherwise normal vital signs.  Labs revealed Urine drug screen that was positive for opiates, benzodiazepines and ecstasy.  She denied taking a liter.  She also denied taking benzodiazepines for a while.  Her COVID-19 was sent out.  Labs were remarkable for a BUN of 33 and creatinine 1.51 and CK was 363 and CBC showed leukocytosis of 18.6 with neutrophilia and anemia.  Urinalysis was unremarkable and alcohol levels less than 10.  Head CT scan without contrast showed large old right MCA infarct with encephalomalacia with chronic small vessel disease with no acute intracranial abnormalities.  Hip x-ray revealed concern for possible nondisplaced left inferior pubic ramus fracture.  The patient will be admitted to a medical monitored bed for further evaluation and  management.  PAST MEDICAL HISTORY:   Past Medical History:  Diagnosis Date  . Aneurysm ([place])   . Anxiety   . Arthritis   . Cancer ([place])   . CHF (congestive heart failure) ([place])   . COPD (chronic obstructive pulmonary disease) ([place])   . Hyperlipidemia   . Hypertension   . Stroke ([place])   . Thyroid disease   Coronary artery disease.  She states that she had MI 4 times  PAST SURGICAL HISTORY:   Past Surgical History:  Procedure Laterality Date  . ABDOMINAL HYSTERECTOMY    . AORTIC/RENAL BYPASS    . bypass graft      SOCIAL HISTORY:   Social History   Tobacco Use  . Smoking status: Current Every Day Smoker  . Smokeless tobacco: Never Used  Substance Use Topics  . Alcohol use: No    FAMILY HISTORY:   Family History  Problem Relation Age of Onset  . Cancer Mother     DRUG ALLERGIES:   Allergies  Allergen Reactions  . Aspirin Other (See Comments)    ulcer  . Sulfa Antibiotics Other (See Comments)    unknown    REVIEW OF SYSTEMS:   Review of Systems  Eyes: Positive for redness.   As per history of present illness. All pertinent systems were reviewed above. Constitutional,  HEENT, cardiovascular, respiratory, GI, GU, musculoskeletal, neuro, psychiatric, endocrine,  integumentary and hematologic systems were reviewed and are otherwise  negative/unremarkable except for positive findings mentioned above in the HPI.   MEDICATIONS  AT HOME:   Prior to Admission medications   Medication Sig Start Date End Date Taking? Authorizing Provider  albuterol (VENTOLIN HFA) 108 (90 Base) MCG/ACT inhaler Inhale 2 puffs into the lungs every 4 (four) hours as needed for wheezing or shortness of breath. 05/24/18   Ronnell Freshwater, NP  arformoterol (BROVANA) 15 MCG/2ML NEBU Take 2 mLs (15 mcg total) by nebulization 2 (two) times daily. 07/25/18   Ronnell Freshwater, NP  atorvastatin (LIPITOR) 40 MG tablet Take 1 tablet (40 mg total) by mouth daily. 07/04/18   Ronnell Freshwater,  NP  diazepam (VALIUM) 2 MG tablet Take by mouth.    [provider]  fenofibrate (TRICOR) 145 MG tablet Take 1 tablet (145 mg total) by mouth daily. 03/23/18   Ronnell Freshwater, NP  HYDROcodone-acetaminophen (NORCO) 5-325 MG tablet Take 1 tablet by mouth 3 (three) times daily as needed for moderate pain. 03/23/18   Ronnell Freshwater, NP  levothyroxine (SYNTHROID) 50 MCG tablet Take 1 tablet (50 mcg total) by mouth daily before breakfast. 06/19/18   Ronnell Freshwater, NP  nitrofurantoin, macrocrystal-monohydrate, (MACROBID) 100 MG capsule Take 1 capsule (100 mg total) by mouth 2 (two) times daily. 09/12/17   Kendell Bane, NP  warfarin (COUMADIN) 2 MG tablet TAKE ONE TABLET BY MOUTH EVERY DAY 09/16/18   Kendell Bane, NP  warfarin (COUMADIN) 3 MG tablet Take by mouth.    [provider]      VITAL SIGNS:  Blood pressure (!) 141/83, pulse (!) 123, temperature 97.9 F (36.6 C), temperature source Oral, resp. rate 16, height 4\' 7"  (1.397 m), weight 45.4 kg, SpO2 97 %.  PHYSICAL EXAMINATION:  Physical Exam  GENERAL:  68 y.o.-year-old Caucasian female patient lying in the bed with no acute distress.  EYES: Pupils equal, round, reactive to light and accommodation. No scleral icterus. Extraocular muscles intact.  HEENT: Head atraumatic, normocephalic. Oropharynx and nasopharynx clear.  NECK:  Supple, no jugular venous distention. No thyroid enlargement, no tenderness.  LUNGS: Normal breath sounds bilaterally, no wheezing, rales,rhonchi or crepitation. No use of accessory muscles of respiration.  CARDIOVASCULAR: Regular rate and rhythm, S1, S2 normal. No murmurs, rubs, or gallops.  ABDOMEN: Soft, nondistended, nontender. Bowel sounds present. No organomegaly or mass.  EXTREMITIES: No pedal edema, cyanosis, or clubbing.  NEUROLOGIC: Cranial nerves II through XII are intact. Muscle strength 5/5 in all extremities. Sensation intact. Gait not checked. Musculoskeletal: She had eft  groin pain. PSYCHIATRIC: The patient is alert and oriented x 3.  Normal affect and good eye contact. SKIN: No obvious rash, lesion, or ulcer.   LABORATORY PANEL:   CBC Recent Labs  Lab 09/28/18 2136  WBC 18.6*  HGB 10.7*  HCT 33.3*  PLT 290   ------------------------------------------------------------------------------------------------------------------  Chemistries  Recent Labs  Lab 09/28/18 2136  NA 136  K 3.6  CL 102  CO2 19*  GLUCOSE 210*  BUN 33*  CREATININE 1.51*  CALCIUM 8.8*  AST 34  ALT 22  ALKPHOS 47  BILITOT 1.5*   ------------------------------------------------------------------------------------------------------------------  Cardiac Enzymes No results for input(s): TROPONINI in the last 168 hours. ------------------------------------------------------------------------------------------------------------------  RADIOLOGY:  Dg Chest 1 View  Result Date: 09/28/2018 CLINICAL DATA:  Fall, weakness, left rib pain EXAM: CHEST  1 VIEW COMPARISON:  None. FINDINGS: Postsurgical changes related to prior CABG including intact and aligned sternotomy wires and multiple surgical clips projecting over the mediastinum. No visible acute displaced rib fracture or other acute traumatic osseous injury. Degenerative  changes are present in the imaged spine and shoulders. Cholecystectomy clips project over the right upper quadrant. No consolidation, features of edema, pneumothorax, or effusion. Atherosclerotic plaque within the normal caliber aorta. Remaining cardiomediastinal contours are unremarkable. IMPRESSION: No visible acute displaced rib fracture or other acute osseous injury. No acute cardiopulmonary abnormality. Prior CABG, cholecystectomy. Electronically Signed   By: Lovena Le M.D.   On: 09/28/2018 22:04   Dg Pelvis 1-2 Views  Result Date: 09/28/2018 CLINICAL DATA:  Fall, left hip pain EXAM: PELVIS - 1-2 VIEW COMPARISON:  None. FINDINGS: Linear lucency noted  through the left inferior pubic ramus concerning for nondisplaced fracture. No femoral fracture. Early joint space narrowing bilaterally within the hip joints. IMPRESSION: Concern for possible nondisplaced left inferior pubic ramus fracture. No femoral neck fracture. Electronically Signed   By: Rolm Baptise M.D.   On: 09/28/2018 22:07   Ct Head Wo Contrast  Result Date: 09/28/2018 CLINICAL DATA:  Altered level of consciousness EXAM: CT HEAD WITHOUT CONTRAST TECHNIQUE: Contiguous axial images were obtained from the base of the skull through the vertex without intravenous contrast. COMPARISON:  None. FINDINGS: Brain: Large old right MCA infarct involving much of the right MCA distribution with encephalomalacia. Chronic microvascular disease throughout the deep white matter. No acute infarction or hemorrhage. No hydrocephalus or midline shift. Vascular: No hyperdense vessel or unexpected calcification. Skull: No acute calvarial abnormality. Sinuses/Orbits: Visualized paranasal sinuses and mastoids clear. Orbital soft tissues unremarkable. Other: None IMPRESSION: Large old right MCA infarct with encephalomalacia. Chronic small vessel disease. No acute intracranial abnormality. Electronically Signed   By: Rolm Baptise M.D.   On: 09/28/2018 22:18      IMPRESSION AND PLAN:  1.  Mechanical fall with subsequent suspected nondisplaced left inferior pubic ramus fracture.  The patient be admitted to a medical bed.  She will be provided with pain management and will obtain physical therapy consult to assess her ambulation.  We will follow neuro checks every 4 hours for 24 hours.  We will check her orthostatics.  2.  Acute kidney injury.  She will be hydrated with IV normal saline we will follow her BMP.  3.  Hyperglycemia.  Will obtain hemoglobin A1c and monitor blood glucose.  4.  Hypertension.  She will be placed as needed  5.  Hypothyroidism.  We will continue Synthroid and check TSH level.  6.  COPD.  We  will continue PRN albuterol.  7.  Dyslipidemia.  We will continue Lipitor and TriCor.  8.  DVT prophylaxis.  Subcutaneous Lovenox.   All the records are reviewed and case discussed with ED provider. The plan of care was discussed in details with the patient (and family). I answered all questions. The patient agreed to proceed with the above mentioned plan. Further management will depend upon hospital course.   CODE STATUS: Full code  TOTAL TIME TAKING CARE OF THIS PATIENT: 55 minutes.    Christel Mormon M.D on 09/28/2018 at 11:59 PM  Pager - [phone]  After 6pm go to [email or web address] - Proofreader  [facility]  Office  [phone]  CC: Primary care physician; Ronnell Freshwater, NP   Note: This dictation was prepared with Dragon dictation along with smaller phrase technology. Any transcriptional errors that result from this process are unintentional.

## 2018-09-28 NOTE — ED Notes (Signed)
purwick placed on pt at this time 

## 2018-09-28 NOTE — ED Triage Notes (Signed)
Pt to ED via EMS from home pt arrives c/o left rib pain that started a few days ago post fall. Pt has fallen multiple times over past few days. Pt lives alone and per family is seemingly unfit to be alone. Per ems multiple empty bottles of vicodin and tramadol in pts residence. Pt was laying on floor today for 8hrs post fall, per pt. Pt given 4mg  zofran by ems.

## 2018-09-28 NOTE — ED Notes (Signed)
Pt has large bruise to left buttocks, pt states it is from fall.

## 2018-09-29 ENCOUNTER — Inpatient Hospital Stay: Payer: Medicare Other

## 2018-09-29 ENCOUNTER — Other Ambulatory Visit: Payer: Self-pay

## 2018-09-29 LAB — HEMOGLOBIN AND HEMATOCRIT, BLOOD
HCT: 25.1 % — ABNORMAL LOW (ref 36.0–46.0)
HCT: 22.8 % — ABNORMAL LOW (ref 36.0–46.0)
HCT: 26 % — ABNORMAL LOW (ref 36.0–46.0)
HCT: 23.5 % — ABNORMAL LOW (ref 36.0–46.0)
Hemoglobin: 8.2 g/dL — ABNORMAL LOW (ref 12.0–15.0)
Hemoglobin: 7.4 g/dL — ABNORMAL LOW (ref 12.0–15.0)
Hemoglobin: 8.6 g/dL — ABNORMAL LOW (ref 12.0–15.0)
Hemoglobin: 7.7 g/dL — ABNORMAL LOW (ref 12.0–15.0)

## 2018-09-29 LAB — BASIC METABOLIC PANEL
Anion gap: 10 (ref 5–15)
BUN: 42 mg/dL — ABNORMAL HIGH (ref 8–23)
CO2: 23 mmol/L (ref 22–32)
Calcium: 8.4 mg/dL — ABNORMAL LOW (ref 8.9–10.3)
Chloride: 103 mmol/L (ref 98–111)
Creatinine, Ser: 1.74 mg/dL — ABNORMAL HIGH (ref 0.44–1.00)
GFR calc Af Amer: 34 mL/min — ABNORMAL LOW (ref >60)
GFR calc non Af Amer: 29 mL/min — ABNORMAL LOW (ref >60)
Glucose, Bld: 189 mg/dL — ABNORMAL HIGH (ref 70–99)
Potassium: 3.6 mmol/L (ref 3.5–5.1)
Sodium: 136 mmol/L (ref 135–145)

## 2018-09-29 LAB — TSH: TSH: 1.355 u[IU]/mL (ref 0.350–4.500)

## 2018-09-29 LAB — PREPARE RBC (CROSSMATCH)

## 2018-09-29 LAB — CBC
HCT: 27.8 % — ABNORMAL LOW (ref 36.0–46.0)
Hemoglobin: 9.1 g/dL — ABNORMAL LOW (ref 12.0–15.0)
MCH: 31 pg (ref 26.0–34.0)
MCHC: 32.7 g/dL (ref 30.0–36.0)
MCV: 94.6 fL (ref 80.0–100.0)
Platelets: 305 10*3/uL (ref 150–400)
RBC: 2.94 MIL/uL — ABNORMAL LOW (ref 3.87–5.11)
RDW: 14.8 % (ref 11.5–15.5)
WBC: 15.1 10*3/uL — ABNORMAL HIGH (ref 4.0–10.5)
nRBC: 0 % (ref 0.0–0.2)

## 2018-09-29 LAB — HEMOGLOBIN A1C
Hgb A1c MFr Bld: 5.2 % (ref 4.8–5.6)
Mean Plasma Glucose: 102.54 mg/dL

## 2018-09-29 LAB — SARS CORONAVIRUS 2 (TAT 6-24 HRS): SARS Coronavirus 2: NEGATIVE

## 2018-09-29 LAB — MAGNESIUM: Magnesium: 2.1 mg/dL (ref 1.7–2.4)

## 2018-09-29 LAB — PROTIME-INR
INR: 7.5 (ref 0.8–1.2)
INR: 8.3 (ref 0.8–1.2)
Prothrombin Time: 62.5 seconds — ABNORMAL HIGH (ref 11.4–15.2)
Prothrombin Time: 67.4 seconds — ABNORMAL HIGH (ref 11.4–15.2)

## 2018-09-29 LAB — ABO/RH: ABO/RH(D): A POS

## 2018-09-29 MED ORDER — SODIUM CHLORIDE 0.9% IV SOLUTION: Freq: Once | INTRAVENOUS | Status: DC

## 2018-09-29 MED ORDER — OXYCODONE HCL 5 MG PO TABS: 5.0000 mg | ORAL_TABLET | ORAL | Status: DC | PRN

## 2018-09-29 MED ORDER — METOPROLOL TARTRATE 50 MG PO TABS
50.0000 mg | ORAL_TABLET | Freq: Two times a day (BID) | ORAL | Status: DC
  Administered 2018-09-29 – 2018-10-02 (×6): 50 mg via ORAL
  Filled 2018-09-29 (×8): qty 1

## 2018-09-29 MED ORDER — AMLODIPINE BESYLATE 10 MG PO TABS
10.0000 mg | ORAL_TABLET | Freq: Every day | ORAL | Status: DC
  Administered 2018-09-29 – 2018-10-02 (×4): 10 mg via ORAL
  Filled 2018-09-29 (×4): qty 1

## 2018-09-29 MED ORDER — PROMETHAZINE HCL 25 MG/ML IJ SOLN
12.5000 mg | Freq: Four times a day (QID) | INTRAMUSCULAR | Status: DC | PRN
  Administered 2018-09-29: 12.5 mg via INTRAVENOUS
  Filled 2018-09-29: qty 1

## 2018-09-29 MED ORDER — DOCUSATE SODIUM 100 MG PO CAPS
100.0000 mg | ORAL_CAPSULE | Freq: Two times a day (BID) | ORAL | Status: DC
  Administered 2018-09-29 – 2018-10-02 (×8): 100 mg via ORAL
  Filled 2018-09-29 (×9): qty 1

## 2018-09-29 MED ORDER — BISACODYL 10 MG RE SUPP
10.0000 mg | Freq: Once | RECTAL | Status: AC
  Administered 2018-09-29: 10 mg via RECTAL
  Filled 2018-09-29: qty 1

## 2018-09-29 MED ORDER — VITAMIN K1 10 MG/ML IJ SOLN
5.0000 mg | Freq: Once | INTRAVENOUS | Status: AC
  Administered 2018-09-29: 5 mg via INTRAVENOUS
  Filled 2018-09-29: qty 0.5

## 2018-09-29 MED ORDER — METOPROLOL TARTRATE 5 MG/5ML IV SOLN
5.0000 mg | INTRAVENOUS | Status: DC | PRN
  Administered 2018-09-29: 5 mg via INTRAVENOUS
  Filled 2018-09-29 (×2): qty 5

## 2018-09-29 MED ORDER — LABETALOL HCL 5 MG/ML IV SOLN
10.0000 mg | INTRAVENOUS | Status: DC | PRN
  Administered 2018-09-29: 10 mg via INTRAVENOUS
  Filled 2018-09-29 (×2): qty 4

## 2018-09-29 MED ORDER — POLYETHYLENE GLYCOL 3350 17 G PO PACK
17.0000 g | PACK | Freq: Every day | ORAL | Status: DC
  Administered 2018-09-29 – 2018-10-03 (×5): 17 g via ORAL
  Filled 2018-09-29 (×5): qty 1

## 2018-09-29 MED ORDER — ARFORMOTEROL TARTRATE 15 MCG/2ML IN NEBU
15.0000 ug | INHALATION_SOLUTION | Freq: Two times a day (BID) | RESPIRATORY_TRACT | Status: DC
  Administered 2018-09-29 – 2018-10-03 (×7): 15 ug via RESPIRATORY_TRACT
  Filled 2018-09-29 (×9): qty 2

## 2018-09-29 MED ORDER — SODIUM CHLORIDE 0.9 % IV SOLN
1.0000 g | INTRAVENOUS | Status: DC
  Administered 2018-09-29 – 2018-10-02 (×4): 1 g via INTRAVENOUS
  Filled 2018-09-29: qty 1
  Filled 2018-09-29: qty 10
  Filled 2018-09-29 (×3): qty 1

## 2018-09-29 MED ORDER — POTASSIUM CHLORIDE 20 MEQ PO PACK
40.0000 meq | PACK | Freq: Once | ORAL | Status: AC
  Administered 2018-09-29: 40 meq via ORAL
  Filled 2018-09-29: qty 2

## 2018-09-29 MED ORDER — PHYTONADIONE 5 MG PO TABS
2.5000 mg | ORAL_TABLET | Freq: Once | ORAL | Status: AC
  Administered 2018-09-29: 2.5 mg via ORAL
  Filled 2018-09-29: qty 1

## 2018-09-29 NOTE — Progress Notes (Signed)
INR 7.5, MD Mansy notified. Ordered Warfarin be stopped and Q4 H&H.

## 2018-09-29 NOTE — ED Notes (Signed)
ED TO INPATIENT HANDOFF REPORT  ED Nurse Name and Phone #: gracie [phone]  S Name/Age/Gender Teresa Holmes 68 y.o. female Room/Bed: ED25A/ED25A  Code Status   Code Status: Full Code  Home/SNF/Other Home Patient oriented to: self, place, time and situation Is this baseline? Yes   Triage Complete: Triage complete  Chief Complaint fall  Triage Note Pt to ED via EMS from home pt arrives c/o left rib pain that started a few days ago post fall. Pt has fallen multiple times over past few days. Pt lives alone and per family is seemingly unfit to be alone. Per ems multiple empty bottles of vicodin and tramadol in pts residence. Pt was laying on floor today for 8hrs post fall, per pt. Pt given 4mg  zofran by ems.   Allergies Allergies  Allergen Reactions  . Aspirin Other (See Comments)    ulcer  . Sulfa Antibiotics Other (See Comments)    unknown    Level of Care/Admitting Diagnosis ED Disposition    ED Disposition Condition [facility] Area: [place] [[id number]]  Level of Care: Med-Surg [16]  Covid Evaluation: Asymptomatic Screening Protocol (No Symptoms)  Diagnosis: Pelvic fracture [facility]) [id number]  Admitting Physician: Christel Mormon [id number]  Attending Physician: Christel Mormon [id number]  Estimated length of stay: 3 - 4 days  Certification:: I certify this patient will need inpatient services for at least 2 midnights  PT Class (Do Not Modify): Inpatient [101]  PT Acc Code (Do Not Modify): Private [1]       B Medical/Surgery History Past Medical History:  Diagnosis Date  . Aneurysm ([place])   . Anxiety   . Arthritis   . Cancer ([place])   . CHF (congestive heart failure) ([place])   . COPD (chronic obstructive pulmonary disease) ([place])   . Hyperlipidemia   . Hypertension   . Stroke ([place])   . Thyroid disease    Past Surgical History:  Procedure Laterality Date  . ABDOMINAL HYSTERECTOMY    . AORTIC/RENAL BYPASS    . bypass graft        A IV Location/Drains/Wounds Patient Lines/Drains/Airways Status   Active Line/Drains/Airways    Name:   Placement date:   Placement time:   Site:   Days:   Peripheral IV 09/28/18 Left Forearm   09/28/18    2136    Forearm   1          Intake/Output Last 24 hours No intake or output data in the 24 hours ending 09/29/18 0007  Labs/Imaging Results for orders placed or performed during the hospital encounter of 09/28/18 (from the past 48 hour(s))  CBC with Differential     Status: Abnormal   Collection Time: 09/28/18  9:36 PM  Result Value Ref Range   WBC 18.6 (H) 4.0 - 10.5 K/uL   RBC 3.48 (L) 3.87 - 5.11 MIL/uL   Hemoglobin 10.7 (L) 12.0 - 15.0 g/dL   HCT 33.3 (L) 36.0 - 46.0 %   MCV 95.7 80.0 - 100.0 fL   MCH 30.7 26.0 - 34.0 pg   MCHC 32.1 30.0 - 36.0 g/dL   RDW 14.6 11.5 - 15.5 %   Platelets 290 150 - 400 K/uL   nRBC 0.0 0.0 - 0.2 %   Neutrophils Relative % 90 %   Neutro Abs 16.9 (H) 1.7 - 7.7 K/uL   Lymphocytes Relative 3 %   Lymphs Abs 0.6 (L) 0.7 - 4.0 K/uL   Monocytes  Relative 6 %   Monocytes Absolute 1.1 (H) 0.1 - 1.0 K/uL   Eosinophils Relative 0 %   Eosinophils Absolute 0.0 0.0 - 0.5 K/uL   Basophils Relative 0 %   Basophils Absolute 0.0 0.0 - 0.1 K/uL   Immature Granulocytes 1 %   Abs Immature Granulocytes 0.11 (H) 0.00 - 0.07 K/uL    Comment: Performed at [facility], [address] [place], [place] [id number]  Comprehensive metabolic panel     Status: Abnormal   Collection Time: 09/28/18  9:36 PM  Result Value Ref Range   Sodium 136 135 - 145 mmol/L   Potassium 3.6 3.5 - 5.1 mmol/L   Chloride 102 98 - 111 mmol/L   CO2 19 (L) 22 - 32 mmol/L   Glucose, Bld 210 (H) 70 - 99 mg/dL   BUN 33 (H) 8 - 23 mg/dL   Creatinine, Ser 1.51 (H) 0.44 - 1.00 mg/dL   Calcium 8.8 (L) 8.9 - 10.3 mg/dL   Total Protein 6.2 (L) 6.5 - 8.1 g/dL   Albumin 3.6 3.5 - 5.0 g/dL   AST 34 15 - 41 U/L   ALT 22 0 - 44 U/L   Alkaline Phosphatase 47 38 - 126 U/L   Total  Bilirubin 1.5 (H) 0.3 - 1.2 mg/dL   GFR calc non Af Amer 35 (L) >60 mL/min   GFR calc Af Amer 40 (L) >60 mL/min   Anion gap 15 5 - 15    Comment: Performed at [facility], [address] [place], [place] [address]  Troponin I (High Sensitivity)     Status: Abnormal   Collection Time: 09/28/18  9:36 PM  Result Value Ref Range   Troponin I (High Sensitivity) 31 (H) <18 ng/L    Comment: (NOTE) Elevated high sensitivity troponin I (hsTnI) values and significant  changes across serial measurements may suggest ACS but many other  chronic and acute conditions are known to elevate hsTnI results.  Refer to the "Links" section for chest pain algorithms and additional  guidance. Performed at [facility], [address] [place], [place] [address]   Ethanol     Status: None   Collection Time: 09/28/18  9:36 PM  Result Value Ref Range   Alcohol, Ethyl (B) <10 <10 mg/dL    Comment: (NOTE) Lowest detectable limit for serum alcohol is 10 mg/dL. For medical purposes only. Performed at [facility], [address] [place], [place] [address]   CK     Status: Abnormal   Collection Time: 09/28/18  9:36 PM  Result Value Ref Range   Total CK 363 (H) 38 - 234 U/L    Comment: Performed at [facility], [address] [place], [place] [address]  Glucose, capillary     Status: Abnormal   Collection Time: 09/28/18  9:43 PM  Result Value Ref Range   Glucose-Capillary 202 (H) 70 - 99 mg/dL  Urinalysis, Complete w Microscopic     Status: Abnormal   Collection Time: 09/28/18 11:00 PM  Result Value Ref Range   Color, Urine YELLOW (A) YELLOW   APPearance HAZY (A) CLEAR   Specific Gravity, Urine 1.009 1.005 - 1.030   pH 5.0 5.0 - 8.0   Glucose, UA NEGATIVE NEGATIVE mg/dL   Hgb urine dipstick MODERATE (A) NEGATIVE   Bilirubin Urine NEGATIVE NEGATIVE   Ketones, ur NEGATIVE NEGATIVE mg/dL   Protein, ur NEGATIVE NEGATIVE mg/dL   Nitrite NEGATIVE NEGATIVE    Leukocytes,Ua  NEGATIVE NEGATIVE   RBC / HPF 0-5 0 - 5 RBC/hpf   WBC, UA 0-5 0 - 5 WBC/hpf   Bacteria, UA RARE (A) NONE SEEN   Squamous Epithelial / LPF 0-5 0 - 5   Mucus PRESENT    Hyaline Casts, UA PRESENT     Comment: Performed at [facility], [address] [place], [place] [address]  Urine Drug Screen, Qualitative ([place] only)     Status: Abnormal   Collection Time: 09/28/18 11:00 PM  Result Value Ref Range   Tricyclic, Ur Screen NONE DETECTED NONE DETECTED   Amphetamines, Ur Screen NONE DETECTED NONE DETECTED   MDMA (Ecstasy)Ur Screen POSITIVE (A) NONE DETECTED   Cocaine Metabolite,Ur [place] NONE DETECTED NONE DETECTED   Opiate, Ur Screen POSITIVE (A) NONE DETECTED   Phencyclidine (PCP) Ur S NONE DETECTED NONE DETECTED   Cannabinoid 50 Ng, Ur [place] NONE DETECTED NONE DETECTED   Barbiturates, Ur Screen NONE DETECTED NONE DETECTED   Benzodiazepine, Ur Scrn POSITIVE (A) NONE DETECTED   Methadone Scn, Ur NONE DETECTED NONE DETECTED    Comment: (NOTE) Tricyclics + metabolites, urine    Cutoff 1000 ng/mL Amphetamines + metabolites, urine  Cutoff 1000 ng/mL MDMA (Ecstasy), urine              Cutoff 500 ng/mL Cocaine Metabolite, urine          Cutoff 300 ng/mL Opiate + metabolites, urine        Cutoff 300 ng/mL Phencyclidine (PCP), urine         Cutoff 25 ng/mL Cannabinoid, urine                 Cutoff 50 ng/mL Barbiturates + metabolites, urine  Cutoff 200 ng/mL Benzodiazepine, urine              Cutoff 200 ng/mL Methadone, urine                   Cutoff 300 ng/mL The urine drug screen provides only a preliminary, unconfirmed analytical test result and should not be used for non-medical purposes. Clinical consideration and professional judgment should be applied to any positive drug screen result due to possible interfering substances. A more specific alternate chemical method must be used in order to obtain a confirmed analytical result. Gas chromatography / mass spectrometry  (GC/MS) is the preferred confirmat ory method. Performed at [facility], [address] [place], [place] [address]    Dg Chest 1 View  Result Date: 09/28/2018 CLINICAL DATA:  Fall, weakness, left rib pain EXAM: CHEST  1 VIEW COMPARISON:  None. FINDINGS: Postsurgical changes related to prior CABG including intact and aligned sternotomy wires and multiple surgical clips projecting over the mediastinum. No visible acute displaced rib fracture or other acute traumatic osseous injury. Degenerative changes are present in the imaged spine and shoulders. Cholecystectomy clips project over the right upper quadrant. No consolidation, features of edema, pneumothorax, or effusion. Atherosclerotic plaque within the normal caliber aorta. Remaining cardiomediastinal contours are unremarkable. IMPRESSION: No visible acute displaced rib fracture or other acute osseous injury. No acute cardiopulmonary abnormality. Prior CABG, cholecystectomy. Electronically Signed   By: Lovena Le M.D.   On: 09/28/2018 22:04   Dg Pelvis 1-2 Views  Result Date: 09/28/2018 CLINICAL DATA:  Fall, left hip pain EXAM: PELVIS - 1-2 VIEW COMPARISON:  None. FINDINGS: Linear lucency noted through the left inferior pubic ramus concerning for nondisplaced fracture. No femoral fracture. Early joint space narrowing bilaterally within  the hip joints. IMPRESSION: Concern for possible nondisplaced left inferior pubic ramus fracture. No femoral neck fracture. Electronically Signed   By: Rolm Baptise M.D.   On: 09/28/2018 22:07   Ct Head Wo Contrast  Result Date: 09/28/2018 CLINICAL DATA:  Altered level of consciousness EXAM: CT HEAD WITHOUT CONTRAST TECHNIQUE: Contiguous axial images were obtained from the base of the skull through the vertex without intravenous contrast. COMPARISON:  None. FINDINGS: Brain: Large old right MCA infarct involving much of the right MCA distribution with encephalomalacia. Chronic microvascular disease  throughout the deep white matter. No acute infarction or hemorrhage. No hydrocephalus or midline shift. Vascular: No hyperdense vessel or unexpected calcification. Skull: No acute calvarial abnormality. Sinuses/Orbits: Visualized paranasal sinuses and mastoids clear. Orbital soft tissues unremarkable. Other: None IMPRESSION: Large old right MCA infarct with encephalomalacia. Chronic small vessel disease. No acute intracranial abnormality. Electronically Signed   By: Rolm Baptise M.D.   On: 09/28/2018 22:18    Pending Labs Unresulted Labs (From admission, onward)    Start     Ordered   09/29/18 [id number]  Basic metabolic panel  Tomorrow morning,   STAT     09/28/18 2358   09/29/18 0500  CBC  Tomorrow morning,   STAT     09/28/18 2358   09/28/18 2359  HIV antibody (Routine Testing)  Once,   STAT     09/28/18 2358   09/28/18 2151  SARS CORONAVIRUS 2 (TAT 6-12 HRS) Nasal Swab Aptima Multi Swab  (Asymptomatic/Tier 2 Patients Labs)  Once,   STAT    Question Answer Comment  Is this test for diagnosis or screening Screening   Symptomatic for COVID-19 as defined by CDC No   Hospitalized for COVID-19 No   Admitted to ICU for COVID-19 No   Previously tested for COVID-19 No   Resident in a congregate (group) care setting No   Employed in healthcare setting No   Pregnant No      09/28/18 2150   09/28/18 2146  Protime-INR  ONCE - STAT,   STAT     09/28/18 2145   09/28/18 2130  Blood gas, venous  ONCE - STAT,   STAT     09/28/18 2129          Vitals/Pain Today's Vitals   09/28/18 2132 09/28/18 2153 09/28/18 2300 09/28/18 2330  BP:  (!) 139/95 123/73 (!) 141/83  Pulse:   (!) 132 (!) 123  Resp:  16    Temp:      TempSrc:      SpO2:   96% 97%  Weight: 45.4 kg     Height: 4\' 7"  (1.397 m)     PainSc:        Isolation Precautions No active isolations  Medications Medications  HYDROcodone-acetaminophen (NORCO/VICODIN) 5-325 MG per tablet 1 tablet (has no administration in time range)   atorvastatin (LIPITOR) tablet 40 mg (has no administration in time range)  fenofibrate tablet 160 mg (has no administration in time range)  diazepam (VALIUM) tablet 2 mg (has no administration in time range)  levothyroxine (SYNTHROID) tablet 50 mcg (has no administration in time range)  nitrofurantoin (macrocrystal-monohydrate) (MACROBID) capsule 100 mg (has no administration in time range)  warfarin (COUMADIN) tablet 2 mg (has no administration in time range)  warfarin (COUMADIN) tablet 3 mg (has no administration in time range)  albuterol (VENTOLIN HFA) 108 (90 Base) MCG/ACT inhaler 2 puff (has no administration in time range)  0.9 %  sodium chloride infusion (  has no administration in time range)  acetaminophen (TYLENOL) tablet 650 mg (has no administration in time range)    Or  acetaminophen (TYLENOL) suppository 650 mg (has no administration in time range)  oxyCODONE (Oxy IR/ROXICODONE) immediate release tablet 5 mg (has no administration in time range)  traZODone (DESYREL) tablet 25 mg (has no administration in time range)  ondansetron (ZOFRAN) tablet 4 mg (has no administration in time range)    Or  ondansetron (ZOFRAN) injection 4 mg (has no administration in time range)    Mobility non-ambulatory High fall risk   Focused Assessments ortho   R Recommendations: See Admitting Provider Note  Report given to:   Additional Notes:

## 2018-09-29 NOTE — Progress Notes (Signed)
[place] for warfarin Indication: atrial fibrillation  Allergies  Allergen Reactions  . Aspirin Other (See Comments)    ulcer  . Sulfa Antibiotics Other (See Comments)    unknown    Patient Measurements: Height: 4\' 7"  (139.7 cm) Weight: 95 lb 7.4 oz (43.3 kg) IBW/kg (Calculated) : 34  Vital Signs: Temp: 98.4 F (36.9 C) (08/28 1543) Temp Source: Oral (08/28 1543) BP: 124/70 (08/28 1543) Pulse Rate: 74 (08/28 1543)  Labs: Recent Labs    09/28/18 2136 09/29/18 0152 09/29/18 0412 09/29/18 0822 09/29/18 1259  HGB 10.7*  --  9.1* 8.2* 7.4*  HCT 33.3*  --  27.8* 25.1* 22.8*  PLT 290  --  305  --   --   LABPROT  --  62.5* 67.4*  --   --   INR  --  7.5* 8.3*  --   --   CREATININE 1.51*  --  1.74*  --   --   CKTOTAL 363*  --   --   --   --   TROPONINIHS 31*  --   --   --   --     Estimated Creatinine Clearance: 18.2 mL/min (A) (by C-G formula based on SCr of 1.74 mg/dL (H)).   Medical History: Past Medical History:  Diagnosis Date  . Aneurysm ([place])   . Anxiety   . Arthritis   . Cancer ([place])   . CHF (congestive heart failure) ([place])   . COPD (chronic obstructive pulmonary disease) ([place])   . Hyperlipidemia   . Hypertension   . Stroke ([place])   . Thyroid disease     Medications:  Scheduled:  . sodium chloride   Intravenous Once  . arformoterol  15 mcg Nebulization BID  . atorvastatin  40 mg Oral Daily  . docusate sodium  100 mg Oral BID  . fenofibrate  145 mg Oral Daily  . levothyroxine  50 mcg Oral QAC breakfast  . metoprolol tartrate  50 mg Oral BID  . polyethylene glycol  17 g Oral Daily    Assessment: Patient here for hip pain and has h/o afib anticoagulated w/ warfarin PTA. Please see PTA med list for schedule.     On 8/28, INR trended upwards from 7.5 to 8.3.  INR is currently critical at 8.3.  Per nurse, pt has hematoma on hip.    8/28 @ 0652. Patient received vit K 2.5 mg PO x 1 dose. IV vit K 5 mg x 1 dose   has been ordered, and she will receive 1 unit pack RBC transfusion.    Goal of Therapy:  INR 2 - 3 Monitor platelets by anticoagulation protocol: Yes   Plan:  Will hold off for now and will continue to monitor daily INRs. Will hold off until INR < 3.0 and will continue to monitor.  Gerald Dexter, PharmD Pharmacy Resident  09/29/2018 4:33 PM

## 2018-09-29 NOTE — TOC Initial Note (Addendum)
Transition of Care [facility]) - Initial/Assessment Note    Patient Details  Name: Teresa Holmes MRN: [id number] Date of Birth: 1950-11-10  Transition of Care [facility]) CM/SW Contact:    Su Hilt, RN Phone Number: 09/29/2018, 11:57 AM  Clinical Narrative:                  Met with the patient and her brother in law She makes too much money for medicaid  I explained that HCPOA is only valid if the patient is not able to make their own choices, the brother in law stated that her friend has the [facility],  The patient is not taking very good care of herself stated the brother in law.  I asked him if it were possible for someone in the family to check on her daily.  He said that he would talk to her about it, He asked me about a possible assisted living facility I explained that the patient has Medicare and they do not cover ALF I explained that they only cover Rehab and [place] He stated she has done both in the past I encouraged him to call around to ALF and get prices so that he would at least have an idea of what they are looking at.  He agreed to do so.  He stated that he does not feel that [facility] would really benefit but may be a starting point.         Patient Goals and CMS Choice        Expected Discharge Plan and Services                                                Prior Living Arrangements/Services                       Activities of Daily Living Home Assistive Devices/Equipment: Cane (specify quad or straight)(quad) ADL Screening (condition at time of admission) Patient's cognitive ability adequate to safely complete daily activities?: Yes Is the patient deaf or have difficulty hearing?: No Does the patient have difficulty seeing, even when wearing glasses/contacts?: No Does the patient have difficulty concentrating, remembering, or making decisions?: No Patient able to express need for assistance with ADLs?: Yes Does the patient have  difficulty dressing or bathing?: No Independently performs ADLs?: Yes (appropriate for developmental age) Does the patient have difficulty walking or climbing stairs?: Yes Weakness of Legs: Left Weakness of Arms/Hands: Left  Permission Sought/Granted                  Emotional Assessment              Admission diagnosis:  Elevated troponin I level [R79.89] AKI (acute kidney injury) ([place]) [N17.9] Fall, initial encounter [W19.XXXA] Patient Active Problem List   Diagnosis Date Noted  . Pelvic fracture ([place]) 09/28/2018  . Encounter for long-term (current) use of medications 05/11/2017  . Chronic pain disorder 05/11/2017  . Generalized anxiety disorder 03/14/2017  . Essential (primary) hypertension 03/14/2017  . Hemiplegia and hemiparesis following cerebral infarction affecting left non-dominant side ([place]) 03/14/2017  . Vitamin D deficiency, unspecified 03/14/2017  . Personal history of venous thrombosis and embolism 03/14/2017  . Congenital spondylolisthesis 03/14/2017  . Nicotine dependence, cigarettes, uncomplicated 58/52/7782  . Mixed hyperlipidemia 03/14/2017  . Long term (current) use of  anticoagulants 03/14/2017  . Other intervertebral disc degeneration, thoracolumbar region 03/14/2017  . Lumbago with sciatica 03/14/2017  . Cardiac arrhythmia 03/14/2017  . Myalgia 03/14/2017  . Hypothyroidism 03/14/2017  . Difficulty walking 03/14/2017  . Left knee pain 03/14/2017   PCP:  Ronnell Freshwater, NP Pharmacy:   [facility] [address] [place] - [place] [address] [place] [address] Phone: [phone] Fax: [phone]  [facility] [place], [place] [address] [place] [address] Phone: [phone] Fax: [phone]  [place], [address] [address] [place] [place] [address] Phone: [phone] Fax: [phone]     Social  Determinants of Health (SDOH) Interventions    Readmission Risk Interventions No flowsheet data found.

## 2018-09-29 NOTE — Progress Notes (Signed)
[place] is 3 d/t HR ranging in the 120s -130, [employer], notified. Pt asymptomatic, NAD. MEWS protocol implemented. Will continue to monitor   MEWS Guidelines - (patients age 69 and over)   Yellow - At risk for Deterioration   1. Go to room and assess patient 2. Validate data. Is this patient's baseline? If data confirmed: 3. Is this an acute change? 4. Administer prn meds/treatments as ordered? 5. Note Sepsis score 6. Review goals of care 7. Sports coach and Provider 8. Call RRT nurse as needed. 9. Document patient condition/interventions/response. 10. Increase frequency of vital signs and focused assessments to at least q2h x2. - If stable, then q4h x2 and then q8h or dept. routine. - If unstable, contact Provider & RRT nurse. Prepare for possible transfer. 11. Add entry in progress notes using the smart phrase ".MEWS".

## 2018-09-29 NOTE — Progress Notes (Signed)
[facility] [place] NAME: Keshana Melvin    MR#:  [id number]  DATE OF BIRTH:  [birth year]  SUBJECTIVE:  CHIEF COMPLAINT:   Chief Complaint  Patient presents with  . Fall   Hip pain still present.  Tachycardic, nauseous has not had a bowel movement in 4 days  REVIEW OF SYSTEMS:    Review of Systems  Constitutional: Positive for malaise/fatigue. Negative for chills, fever and weight loss.  HENT: Negative for hearing loss and nosebleeds.   Eyes: Negative for blurred vision, double vision and pain.  Respiratory: Negative for cough, hemoptysis, sputum production, shortness of breath and wheezing.   Cardiovascular: Negative for chest pain, palpitations, orthopnea and leg swelling.  Gastrointestinal: Positive for constipation and nausea. Negative for abdominal pain, diarrhea and vomiting.  Genitourinary: Negative for dysuria and hematuria.  Musculoskeletal: Positive for back pain, falls and joint pain. Negative for myalgias.  Skin: Negative for rash.  Neurological: Negative for dizziness, tremors, sensory change, speech change, focal weakness, seizures and headaches.  Endo/Heme/Allergies: Does not bruise/bleed easily.  Psychiatric/Behavioral: Negative for depression and memory loss. The patient is not nervous/anxious.     DRUG ALLERGIES:   Allergies  Allergen Reactions  . Aspirin Other (See Comments)    ulcer  . Sulfa Antibiotics Other (See Comments)    unknown    VITALS:  Blood pressure (!) 185/88, pulse (!) 121, temperature 97.9 F (36.6 C), temperature source Oral, resp. rate 18, height 4\' 7"  (1.397 m), weight 43.3 kg, SpO2 97 %.  PHYSICAL EXAMINATION:   Physical Exam  GENERAL:  68 y.o.-year-old patient lying in the bed with distress due to nausea EYES: Pupils equal, round, reactive to light and accommodation. No scleral icterus. Extraocular muscles intact.  HEENT: Head atraumatic, normocephalic. Oropharynx and nasopharynx  clear.  NECK:  Supple, no jugular venous distention. No thyroid enlargement, no tenderness.  LUNGS: Normal breath sounds bilaterally, no wheezing, rales, rhonchi. No use of accessory muscles of respiration.  CARDIOVASCULAR: S1, S2 normal. No murmurs, rubs, or gallops.  ABDOMEN: Soft, diffusely tender, nondistended. Bowel sounds decreased. No organomegaly or mass.  EXTREMITIES: No cyanosis, clubbing or edema b/l.    NEUROLOGIC: Cranial nerves II through XII are intact. No focal Motor or sensory deficits b/l.   PSYCHIATRIC: The patient is alert and oriented x 3.  Anxious SKIN: No obvious rash, lesion, or ulcer.   LABORATORY PANEL:   CBC Recent Labs  Lab 09/29/18 0412  09/29/18 1259  WBC 15.1*  --   --   HGB 9.1*   < > 7.4*  HCT 27.8*   < > 22.8*  PLT 305  --   --    < > = values in this interval not displayed.   ------------------------------------------------------------------------------------------------------------------ Chemistries  Recent Labs  Lab 09/28/18 2136 09/29/18 0412  NA 136 136  K 3.6 3.6  CL 102 103  CO2 19* 23  GLUCOSE 210* 189*  BUN 33* 42*  CREATININE 1.51* 1.74*  CALCIUM 8.8* 8.4*  MG  --  2.1  AST 34  --   ALT 22  --   ALKPHOS 47  --   BILITOT 1.5*  --    ------------------------------------------------------------------------------------------------------------------  Cardiac Enzymes No results for input(s): TROPONINI in the last 168 hours. ------------------------------------------------------------------------------------------------------------------  RADIOLOGY:  Dg Chest 1 View  Result Date: 09/28/2018 CLINICAL DATA:  Fall, weakness, left rib pain EXAM: CHEST  1 VIEW COMPARISON:  None. FINDINGS: Postsurgical changes related to prior CABG including  intact and aligned sternotomy wires and multiple surgical clips projecting over the mediastinum. No visible acute displaced rib fracture or other acute traumatic osseous injury. Degenerative  changes are present in the imaged spine and shoulders. Cholecystectomy clips project over the right upper quadrant. No consolidation, features of edema, pneumothorax, or effusion. Atherosclerotic plaque within the normal caliber aorta. Remaining cardiomediastinal contours are unremarkable. IMPRESSION: No visible acute displaced rib fracture or other acute osseous injury. No acute cardiopulmonary abnormality. Prior CABG, cholecystectomy. Electronically Signed   By: Lovena Le M.D.   On: 09/28/2018 22:04   Dg Pelvis 1-2 Views  Result Date: 09/28/2018 CLINICAL DATA:  Fall, left hip pain EXAM: PELVIS - 1-2 VIEW COMPARISON:  None. FINDINGS: Linear lucency noted through the left inferior pubic ramus concerning for nondisplaced fracture. No femoral fracture. Early joint space narrowing bilaterally within the hip joints. IMPRESSION: Concern for possible nondisplaced left inferior pubic ramus fracture. No femoral neck fracture. Electronically Signed   By: Rolm Baptise M.D.   On: 09/28/2018 22:07   Ct Head Wo Contrast  Result Date: 09/28/2018 CLINICAL DATA:  Altered level of consciousness EXAM: CT HEAD WITHOUT CONTRAST TECHNIQUE: Contiguous axial images were obtained from the base of the skull through the vertex without intravenous contrast. COMPARISON:  None. FINDINGS: Brain: Large old right MCA infarct involving much of the right MCA distribution with encephalomalacia. Chronic microvascular disease throughout the deep white matter. No acute infarction or hemorrhage. No hydrocephalus or midline shift. Vascular: No hyperdense vessel or unexpected calcification. Skull: No acute calvarial abnormality. Sinuses/Orbits: Visualized paranasal sinuses and mastoids clear. Orbital soft tissues unremarkable. Other: None IMPRESSION: Large old right MCA infarct with encephalomalacia. Chronic small vessel disease. No acute intracranial abnormality. Electronically Signed   By: Rolm Baptise M.D.   On: 09/28/2018 22:18   Dg Abd 2  Views  Result Date: 09/29/2018 CLINICAL DATA:  Constipation EXAM: ABDOMEN - 2 VIEW COMPARISON:  Portable chest 09/28/2018 FINDINGS: Mild amount of large and small bowel gas suggesting ileus. No bowel obstruction. Small amount of stool in the colon. No free air on the upright view. Surgical clips in the gallbladder fossa Left lower rib fractures appear acute or subacute and unchanged from yesterday. IMPRESSION: Mild ileus.  Minimal stool in the colon. Left rib fractures Electronically Signed   By: Franchot Gallo M.D.   On: 09/29/2018 10:24   ASSESSMENT AND PLAN:   *Acute blood loss anemia secondary to fracture and being on Coumadin.  INR at 8. Tachycardic and symptomatic.  Will transfuse 1 unit packed RBC and 1 unit FFP.  Vitamin K IV ordered. Serial hemoglobin checks.  Follow INR. Coumadin held  *Mild ileus.  Likely secondary to pain and narcotics.  No obstruction.  Order Dulcolax suppository.  Bowel sounds present.  Nausea present but no vomiting.  * Mechanical fall with suspected nondisplaced left inferior pubic ramus fracture.   Pain control.  Physical therapy consultation.  Nonoperative.  *  Acute kidney injury.  Continue IV fluids.  Monitor input and output.  Repeat labs in the morning.  *  Hyperglycemia.  Will obtain hemoglobin A1c and monitor blood glucose.  *  Hypertension.  She will be placed as needed  *  Hypothyroidism.  We will continue Synthroid and check TSH level.  *  COPD.  We will continue PRN albuterol.  *  Dyslipidemia.  We will continue Lipitor and TriCor.  *Paroxysmal atrial fibrillation.  Presently has sinus tachycardia.  Started metoprolol oral.  Coumadin held.  All  the records are reviewed and case discussed with Care Management/Social Worker Management plans discussed with the patient, family and they are in agreement.  CODE STATUS: DNR/DNI  DVT Prophylaxis: SCDs  TOTAL CRITICAL CARE TIME TAKING CARE OF THIS PATIENT: 80 minutes.   POSSIBLE D/C IN  2-3 DAYS, DEPENDING ON CLINICAL CONDITION.  Leia Alf Raeonna Milo M.D on 09/29/2018 at 2:00 PM  Between 7am to 6pm - Pager - [phone]  After 6pm go to [email or web address] - password [email or web address] [facility]  Office  [phone]  CC: Primary care physician; Ronnell Freshwater, NP  Note: This dictation was prepared with Dragon dictation along with smaller phrase technology. Any transcriptional errors that result from this process are unintentional.

## 2018-09-29 NOTE — Progress Notes (Signed)
ANTICOAGULATION CONSULT NOTE - Initial Consult  Pharmacy Consult for warfarin Indication: atrial fibrillation  Allergies  Allergen Reactions  . Aspirin Other (See Comments)    ulcer  . Sulfa Antibiotics Other (See Comments)    unknown    Patient Measurements: Height: 4\' 7"  (139.7 cm) Weight: 99 lb 13.9 oz (45.3 kg) IBW/kg (Calculated) : 34  Vital Signs: Temp: 97.4 F (36.3 C) (08/28 0107) Temp Source: Oral (08/28 0107) BP: 158/83 (08/28 0107) Pulse Rate: 123 (08/28 0107)  Labs: Recent Labs    09/28/18 2136 09/29/18 0152  HGB 10.7*  --   HCT 33.3*  --   PLT 290  --   LABPROT  --  62.5*  INR  --  7.5*  CREATININE 1.51*  --   CKTOTAL 363*  --   TROPONINIHS 31*  --     Estimated Creatinine Clearance: 21.4 mL/min (A) (by C-G formula based on SCr of 1.51 mg/dL (H)).   Medical History: Past Medical History:  Diagnosis Date  . Aneurysm ([place])   . Anxiety   . Arthritis   . Cancer ([place])   . CHF (congestive heart failure) ([place])   . COPD (chronic obstructive pulmonary disease) ([place])   . Hyperlipidemia   . Hypertension   . Stroke ([place])   . Thyroid disease     Medications:  Scheduled:  . atorvastatin  40 mg Oral Daily  . fenofibrate  160 mg Oral Daily  . levothyroxine  50 mcg Oral QAC breakfast  . potassium chloride  40 mEq Oral Once    Assessment: Patient here for hip pain and has h/o afib anticoagulated w/ warfarin PTA. Please see PTA med list for schedule. INR is currently critical at 7.5.  Goal of Therapy:  INR 2 - 3 Monitor platelets by anticoagulation protocol: Yes   Plan:  Will hold off for now and will continue to monitor daily INRs. Will hold off until INR < 3.0 and will continue to monitor.  Tobie Lords, PharmD, BCPS Clinical Pharmacist 09/29/2018,3:55 AM

## 2018-09-30 ENCOUNTER — Inpatient Hospital Stay: Payer: Medicare Other

## 2018-09-30 LAB — BPAM RBC
Blood Product Expiration Date: 202009232359
ISSUE DATE / TIME: 202008281516
Unit Type and Rh: 6200

## 2018-09-30 LAB — HEMOGLOBIN AND HEMATOCRIT, BLOOD
HCT: 23.5 % — ABNORMAL LOW (ref 36.0–46.0)
HCT: 22.7 % — ABNORMAL LOW (ref 36.0–46.0)
HCT: 21.7 % — ABNORMAL LOW (ref 36.0–46.0)
HCT: 23 % — ABNORMAL LOW (ref 36.0–46.0)
HCT: 22.8 % — ABNORMAL LOW (ref 36.0–46.0)
Hemoglobin: 7.8 g/dL — ABNORMAL LOW (ref 12.0–15.0)
Hemoglobin: 7.4 g/dL — ABNORMAL LOW (ref 12.0–15.0)
Hemoglobin: 7.2 g/dL — ABNORMAL LOW (ref 12.0–15.0)
Hemoglobin: 7.5 g/dL — ABNORMAL LOW (ref 12.0–15.0)
Hemoglobin: 7.3 g/dL — ABNORMAL LOW (ref 12.0–15.0)

## 2018-09-30 LAB — PREPARE FRESH FROZEN PLASMA: Unit division: 0

## 2018-09-30 LAB — TYPE AND SCREEN
ABO/RH(D): A POS
Antibody Screen: NEGATIVE
Unit division: 0

## 2018-09-30 LAB — BPAM FFP
Blood Product Expiration Date: 202009022359
ISSUE DATE / TIME: 202008281148
Unit Type and Rh: 6200

## 2018-09-30 LAB — PROTIME-INR
INR: 1.1 (ref 0.8–1.2)
Prothrombin Time: 13.7 seconds (ref 11.4–15.2)

## 2018-09-30 LAB — HIV ANTIBODY (ROUTINE TESTING W REFLEX): HIV Screen 4th Generation wRfx: NONREACTIVE

## 2018-09-30 NOTE — Evaluation (Signed)
Physical Therapy Evaluation Patient Details Name: Teresa Holmes MRN: [id number] DOB: [birth year] Today's Date: 09/30/2018   History of Present Illness  Teresa Holmes is a 59yoF who [facility] on 8/[facility] after sustaining a fall at home down on floor for ~8 hours, pt unable to reach her telephone or lifealert. In ED pt found to have Left rib fractures (not individually identified) and Left pubic ramus fracture. PMH Left arm hemiparesis and Left ankle weakness/postural changes s/p CVA. PTA pt was modified independent in ADL, IADL, driving. BIL reports pt's AMB has been slow, choppy and concerning for quite some time. 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 Pt lives alone.  Clinical Impression  Pt admitted with above diagnosis. Pt currently with functional limitations due to the deficits listed below (see "PT Problem List"). Upon entry, pt in bed, easily made awake and agreeable to participate. The pt is alert and oriented x4, pleasant, conversational, and generally a good historian. LLE is weak and limited in ROM, likely secondary to large hematoma. Movement of LLE is surprisingly well tolerated considering her pelvis fracture. Bed mobility is severely limited by rib pain at left anterolateral chest wall, requires maxA to EOB. Pt able to rise to standing with modA lift assist, but is unable to stabilize her Left ankle and reports feeling dizzy and weak upon standing- pt assisted back to supine. Pt typically uses a cane for mobility and would benefit from a higher level assistive of device, but this is complicated by chronic LUE hemiparesis. Functional mobility assessment demonstrates increased effort/time requirements, poor tolerance, and need for physical assistance, whereas the patient performed these at a higher level of independence PTA. Pt has a DTR ~90 minutes away, BIL nearby; she will need placement at DC to return to Prior level of independence prior to return  to living alone at home. Pt will benefit from skilled PT intervention to increase independence and safety with basic mobility in preparation for discharge to the venue listed below.       Follow Up Recommendations SNF;Supervision - Intermittent;Supervision for mobility/OOB    Equipment Recommendations  Other (comment)(to be determined)    Recommendations for Other Services       Precautions / Restrictions Precautions Precautions: Fall Precaution Comments: Left rib fractures, hematoma, c dropping H&H Restrictions Weight Bearing Restrictions: No      Mobility  Bed Mobility Overal bed mobility: Needs Assistance Bed Mobility: Supine to Sit;Sit to Supine     Supine to sit: Max assist Sit to supine: Max assist   General bed mobility comments: limited by rib pain  Transfers Overall transfer level: Needs assistance   Transfers: Sit to/from Stand Sit to Stand: Mod assist         General transfer comment: slow, painful, weak, fearful of left ankle rolling 2/2 familiar spasticity feelings; pt immediate dizzy/giddy after attempt.  Ambulation/Gait                Stairs            Wheelchair Mobility    Modified Rankin (Stroke Patients Only)       Balance Overall balance assessment: History of Falls;Needs assistance Sitting-balance support: Single extremity supported Sitting balance-Leahy Scale: Fair     Standing balance support: Single extremity supported Standing balance-Leahy Scale: Zero  Pertinent Vitals/Pain Pain Assessment: 0-10 Pain Score: 10-Worst pain ever Pain Location: Left hip hematoma and left anterolateral chest wall under breast (rib fractures) Pain Intervention(s): Limited activity within patient's tolerance;Monitored during session;Premedicated before session;Repositioned    Home Living Family/patient expects to be discharged to:: Private residence Living Arrangements: Alone Available Help  at Discharge: Family Type of Home: House Home Access: Ramped entrance     Home Layout: Two level;Able to live on main level with bedroom/bathroom Home Equipment: Bedside commode;Cane - single point;Wheelchair - Sport and exercise psychologist Comments: 1 fall in past 6 months    Prior Function Level of Independence: Independent with assistive device(s)         Comments: still drives, independent with IADL, biorbaths for bathing;     Hand Dominance   Dominant Hand: Right    Extremity/Trunk Assessment   Upper Extremity Assessment Upper Extremity Assessment: LUE deficits/detail;Defer to OT evaluation LUE Deficits / Details: Chronic LUE hemiparesis s/p CVA    Lower Extremity Assessment Lower Extremity Assessment: LLE deficits/detail LLE Deficits / Details: Left ankle neuromotor abnormality s/p remote CVA, spasticity often pulling ankle into inverted position causing balance issues. LLE Coordination: decreased gross motor;decreased fine motor(limited ABDCT and limited heel slide, limited BKR)       Communication   Communication: No difficulties  Cognition Arousal/Alertness: Awake/alert Behavior During Therapy: WFL for tasks assessed/performed Overall Cognitive Status: Within Functional Limits for tasks assessed                                        General Comments      Exercises General Exercises - Lower Extremity Heel Slides: AAROM;PROM;Left;5 reps;Supine Hip ABduction/ADduction: PROM;AAROM;Left;5 reps;Supine Hip Flexion/Marching: AAROM;Left;5 reps;Supine   Assessment/Plan    PT Assessment Patient needs continued PT services  PT Problem List Decreased strength;Decreased activity tolerance;Decreased range of motion;Decreased balance;Decreased mobility;Decreased knowledge of precautions       PT Treatment Interventions DME instruction;Gait training;Balance training;Functional mobility training;Therapeutic activities;Therapeutic  exercise;Patient/family education    PT Goals (Current goals can be found in the Care Plan section)  Acute Rehab PT Goals Patient Stated Goal: improve mobility without pain PT Goal Formulation: With patient Time For Goal Achievement: 10/14/18 Potential to Achieve Goals: Fair    Frequency 7X/week   Barriers to discharge Inaccessible home environment;Decreased caregiver support      Co-evaluation               AM-PAC PT "6 Clicks" Mobility  Outcome Measure Help needed turning from your back to your side while in a flat bed without using bedrails?: Total Help needed moving from lying on your back to sitting on the side of a flat bed without using bedrails?: Total Help needed moving to and from a bed to a chair (including a wheelchair)?: Total Help needed standing up from a chair using your arms (e.g., wheelchair or bedside chair)?: A Lot Help needed to walk in hospital room?: Total Help needed climbing 3-5 steps with a railing? : Total 6 Click Score: 7    End of Session Equipment Utilized During Treatment: Oxygen Activity Tolerance: Treatment limited secondary to medical complications (Comment);Patient limited by pain(giddiness with transfer attempt) Patient left: in bed;with call bell/phone within reach;with bed alarm set Nurse Communication: Mobility status PT Visit Diagnosis: Unsteadiness on feet (R26.81);Other abnormalities of gait and mobility (R26.89);Difficulty in walking, not elsewhere classified (R26.2);Muscle weakness (generalized) (M62.81);History of falling (Z91.81)  Time: [id number] PT Time Calculation (min) (ACUTE ONLY): 27 min   Charges:   PT Evaluation $PT Eval High Complexity: 1 High PT Treatments $Therapeutic Exercise: 8-22 mins        5:20 PM, 09/30/18 Etta Grandchild, PT, DPT Physical Therapist - [facility]  [phone] ([place])    [place] C 09/30/2018, 5:15 PM

## 2018-09-30 NOTE — Progress Notes (Signed)
[place] for warfarin Indication: atrial fibrillation  Allergies  Allergen Reactions  . Aspirin Other (See Comments)    ulcer  . Sulfa Antibiotics Other (See Comments)    unknown    Patient Measurements: Height: 4\' 7"  (139.7 cm) Weight: 95 lb 7.4 oz (43.3 kg) IBW/kg (Calculated) : 34  Vital Signs: Temp: 98.1 F (36.7 C) (08/29 0817) Temp Source: Oral (08/29 0817) BP: 130/60 (08/29 1054) Pulse Rate: 72 (08/29 1054)  Labs: Recent Labs    09/28/18 2136 09/29/18 0152 09/29/18 0412  09/30/18 0447 09/30/18 1042 09/30/18 1152  HGB 10.7*  --  9.1*   < > 7.8* 7.4* 7.2*  HCT 33.3*  --  27.8*   < > 23.5* 22.7* 21.7*  PLT 290  --  305  --   --   --   --   LABPROT  --  62.5* 67.4*  --  13.7  --   --   INR  --  7.5* 8.3*  --  1.1  --   --   CREATININE 1.51*  --  1.74*  --   --   --   --   CKTOTAL 363*  --   --   --   --   --   --   TROPONINIHS 31*  --   --   --   --   --   --    < > = values in this interval not displayed.    Estimated Creatinine Clearance: 18.2 mL/min (A) (by C-G formula based on SCr of 1.74 mg/dL (H)).   Medical History: Past Medical History:  Diagnosis Date  . Aneurysm ([place])   . Anxiety   . Arthritis   . Cancer ([place])   . CHF (congestive heart failure) ([place])   . COPD (chronic obstructive pulmonary disease) ([place])   . Hyperlipidemia   . Hypertension   . Stroke ([place])   . Thyroid disease     Medications:  Scheduled:  . sodium chloride   Intravenous Once  . amLODipine  10 mg Oral Daily  . arformoterol  15 mcg Nebulization BID  . atorvastatin  40 mg Oral Daily  . docusate sodium  100 mg Oral BID  . fenofibrate  145 mg Oral Daily  . levothyroxine  50 mcg Oral QAC breakfast  . metoprolol tartrate  50 mg Oral BID  . polyethylene glycol  17 g Oral Daily    Assessment: Patient here for hip pain and has h/o afib anticoagulated w/ warfarin PTA. Please see PTA med list for schedule.     On 8/28, INR trended  upwards from 7.5 to 8.3.  INR is currently critical at 8.3.  Per nurse, pt has hematoma on hip.  8/28 @ 0652. Patient received vit K 2.5 mg PO x 1 dose. IV vit K 5 mg x 1 dose  has been ordered, and she will receive 1 unit pack RBC transfusion.    Per Med Rec: Patient on Warfarin 2 mg T,Th, S,S and 3 mg M,W,F  8/28 INR 8.3 Dose Held 8/29 INR 1.1   Goal of Therapy:  INR 2 - 3 Monitor platelets by anticoagulation protocol: Yes   Plan:  Per MD note 8/29- may consider restarting Coumadin tomorrow 8/30 if Hgb remains stable will continue to monitor daily INRs. On Ceftriaxone  Chinita Greenland PharmD Clinical Pharmacist 09/30/2018

## 2018-09-30 NOTE — Progress Notes (Signed)
Hematoma to L hip continues to spread from outlined region. Region has been outlined twice and hematoma has exceeded beyond markings, [employer] notified

## 2018-09-30 NOTE — Progress Notes (Signed)
Family Meeting Note  Advance Directive:yes  Today a meeting took place with the Patient. The following clinical team members were present during this meeting:MD  The following were discussed:Patient's diagnosis: pelvic fracture Anemia PAF , Patient's progosis: Unable to determine and Goals for treatment: DNR  Additional follow-up to be provided: patient has power of attorney Teresa Holmes DNR form filled and placed in chart.   spent during discussion: 16 minutes   Teresa Costa, MD

## 2018-09-30 NOTE — Progress Notes (Signed)
[place] at [place] NAME: Teresa Holmes    MR#:  [id number]  DATE OF BIRTH:  Nov 13, 1950  SUBJECTIVE:   patient feeling ok this am   REVIEW OF SYSTEMS:    Review of Systems  Constitutional: Negative for fever, chills weight loss HENT: Negative for ear pain, nosebleeds, congestion, facial swelling, rhinorrhea, neck pain, neck stiffness and ear discharge.   Respiratory: Negative for cough, shortness of breath, wheezing  Cardiovascular: Negative for chest pain, palpitations and leg swelling.  Gastrointestinal: Negative for heartburn, abdominal pain, vomiting, diarrhea or consitpation Genitourinary: Negative for dysuria, urgency, frequency, hematuria Musculoskeletal: + pelvic painNeurological: Negative for dizziness, seizures, syncope, focal weakness,  numbness and headaches.  Hematological: Does not bruise/bleed easily.  Psychiatric/Behavioral: Negative for hallucinations, confusion, dysphoric mood    Tolerating Diet: yes      DRUG ALLERGIES:   Allergies  Allergen Reactions  . Aspirin Other (See Comments)    ulcer  . Sulfa Antibiotics Other (See Comments)    unknown    VITALS:  Blood pressure 130/60, pulse 72, temperature 98.1 F (36.7 C), temperature source Oral, resp. rate 16, height 4\' 7"  (1.397 m), weight 43.3 kg, SpO2 97 %.  PHYSICAL EXAMINATION:  Constitutional: Appears well-developed and well-nourished. No distress. HENT: Normocephalic. Marland Kitchen Oropharynx is clear and moist.  Eyes: Conjunctivae and EOM are normal. PERRLA, no scleral icterus.  Neck: Normal ROM. Neck supple. No JVD. No tracheal deviation. CVS: RRR, S1/S2 +, no murmurs, no gallops, no carotid bruit.  Pulmonary: Effort and breath sounds normal, no stridor, rhonchi, wheezes, rales.  Abdominal: Soft. BS +,  no distension, tenderness, rebound or guarding.  Musculoskeletal: Normal range of motion. No edema and no tenderness.  Neuro: Alert. CN 2-12 grossly intact.   Left arm weakness from previous CVA LLE 4/5  Skin: bruising all around pelvis extending beyond marking Psychiatric: Normal mood and affect.      LABORATORY PANEL:   CBC Recent Labs  Lab 09/29/18 0412  09/30/18 1042  WBC 15.1*  --   --   HGB 9.1*   < > 7.4*  HCT 27.8*   < > 22.7*  PLT 305  --   --    < > = values in this interval not displayed.   ------------------------------------------------------------------------------------------------------------------  Chemistries  Recent Labs  Lab 09/28/18 2136 09/29/18 0412  NA 136 136  K 3.6 3.6  CL 102 103  CO2 19* 23  GLUCOSE 210* 189*  BUN 33* 42*  CREATININE 1.51* 1.74*  CALCIUM 8.8* 8.4*  MG  --  2.1  AST 34  --   ALT 22  --   ALKPHOS 47  --   BILITOT 1.5*  --    ------------------------------------------------------------------------------------------------------------------  Cardiac Enzymes No results for input(s): TROPONINI in the last 168 hours. ------------------------------------------------------------------------------------------------------------------  RADIOLOGY:  Dg Chest 1 View  Result Date: 09/28/2018 CLINICAL DATA:  Fall, weakness, left rib pain EXAM: CHEST  1 VIEW COMPARISON:  None. FINDINGS: Postsurgical changes related to prior CABG including intact and aligned sternotomy wires and multiple surgical clips projecting over the mediastinum. No visible acute displaced rib fracture or other acute traumatic osseous injury. Degenerative changes are present in the imaged spine and shoulders. Cholecystectomy clips project over the right upper quadrant. No consolidation, features of edema, pneumothorax, or effusion. Atherosclerotic plaque within the normal caliber aorta. Remaining cardiomediastinal contours are unremarkable. IMPRESSION: No visible acute displaced rib fracture or other acute osseous injury. No acute cardiopulmonary abnormality. Prior  CABG, cholecystectomy. Electronically Signed   By: Lovena Le M.D.   On: 09/28/2018 22:04   Dg Pelvis 1-2 Views  Result Date: 09/28/2018 CLINICAL DATA:  Fall, left hip pain EXAM: PELVIS - 1-2 VIEW COMPARISON:  None. FINDINGS: Linear lucency noted through the left inferior pubic ramus concerning for nondisplaced fracture. No femoral fracture. Early joint space narrowing bilaterally within the hip joints. IMPRESSION: Concern for possible nondisplaced left inferior pubic ramus fracture. No femoral neck fracture. Electronically Signed   By: Rolm Baptise M.D.   On: 09/28/2018 22:07   Dg Abd 1 View  Result Date: 09/30/2018 CLINICAL DATA:  Ileus. EXAM: ABDOMEN - 1 VIEW COMPARISON:  09/29/2018 FINDINGS: Potentially mild residual ileus involving colon and small bowel but no significant dilated bowel loops, obstructive pattern or signs of free air. No abnormal calcifications. IMPRESSION: No evidence of bowel obstruction.  Potential mild residual ileus. Electronically Signed   By: Aletta Edouard M.D.   On: 09/30/2018 09:04   Ct Head Wo Contrast  Result Date: 09/28/2018 CLINICAL DATA:  Altered level of consciousness EXAM: CT HEAD WITHOUT CONTRAST TECHNIQUE: Contiguous axial images were obtained from the base of the skull through the vertex without intravenous contrast. COMPARISON:  None. FINDINGS: Brain: Large old right MCA infarct involving much of the right MCA distribution with encephalomalacia. Chronic microvascular disease throughout the deep white matter. No acute infarction or hemorrhage. No hydrocephalus or midline shift. Vascular: No hyperdense vessel or unexpected calcification. Skull: No acute calvarial abnormality. Sinuses/Orbits: Visualized paranasal sinuses and mastoids clear. Orbital soft tissues unremarkable. Other: None IMPRESSION: Large old right MCA infarct with encephalomalacia. Chronic small vessel disease. No acute intracranial abnormality. Electronically Signed   By: Rolm Baptise M.D.   On: 09/28/2018 22:18   Dg Abd 2 Views  Result Date:  09/29/2018 CLINICAL DATA:  Constipation EXAM: ABDOMEN - 2 VIEW COMPARISON:  Portable chest 09/28/2018 FINDINGS: Mild amount of large and small bowel gas suggesting ileus. No bowel obstruction. Small amount of stool in the colon. No free air on the upright view. Surgical clips in the gallbladder fossa Left lower rib fractures appear acute or subacute and unchanged from yesterday. IMPRESSION: Mild ileus.  Minimal stool in the colon. Left rib fractures Electronically Signed   By: Franchot Gallo M.D.   On: 09/29/2018 10:24     ASSESSMENT AND PLAN:   68 y/o female with CVA and residual weakness left side presented to ED after a mechanical fall and left hip pain.  1. Mechanical fall with subsequent  nondisplaced left inferior pubic ramus fracture: This is non operable PT consult and pain control  2. Acute blood loss anemia with large hematoma after mechanical fall while on Coumadin: Patient received 1 unit PRBC and FFP.  Hemoglobin remained stable.  No blood transfusion today.  Continue monitor hemoglobin.   3.  Ileus: Patient has had bowel movement.  She is not complaining nausea or vomiting.  She denies abdominal pain.  X-ray today shows possible mild ileus.  It appears to me that this is resolved.  4.  Acute kidney injury in the setting dehydration from mechanical fall: Repeat creatinine in am  5.  Rhythm: Continue Synthroid  6.  Essential hypertension: Continue Norvasc and metoprolol 7.  History of stroke with left-sided residual weakness: Continue atorvastatin Coumadin on hold for now  8.  PAF: Heart rate better controlled. May consider restarting Coumadin tomorrow if hemoglobin remains stable Metoprolol for heart rate control   PT consult for discharge  planning   Management plans discussed with the patient and she is in agreement.  CODE STATUS: DNR  TOTAL TIME TAKING CARE OF THIS PATIENT: 30 minutes.     POSSIBLE D/C 1-2 days, DEPENDING ON CLINICAL CONDITION.   Bettey Costa  M.D on 09/30/2018 at 11:57 AM  Between 7am to 6pm - Pager - [phone] After 6pm go to [email or web address] - password [email or web address] [facility]  Office  [phone]  CC: Primary care physician; Ronnell Freshwater, NP  Note: This dictation was prepared with Dragon dictation along with smaller phrase technology. Any transcriptional errors that result from this process are unintentional.

## 2018-10-01 LAB — BASIC METABOLIC PANEL
Anion gap: 6 (ref 5–15)
BUN: 20 mg/dL (ref 8–23)
CO2: 26 mmol/L (ref 22–32)
Calcium: 8 mg/dL — ABNORMAL LOW (ref 8.9–10.3)
Chloride: 107 mmol/L (ref 98–111)
Creatinine, Ser: 0.79 mg/dL (ref 0.44–1.00)
GFR calc Af Amer: >60 mL/min (ref >60)
GFR calc non Af Amer: >60 mL/min (ref >60)
Glucose, Bld: 128 mg/dL — ABNORMAL HIGH (ref 70–99)
Potassium: 3.9 mmol/L (ref 3.5–5.1)
Sodium: 139 mmol/L (ref 135–145)

## 2018-10-01 LAB — CBC
HCT: 22.3 % — ABNORMAL LOW (ref 36.0–46.0)
Hemoglobin: 7.2 g/dL — ABNORMAL LOW (ref 12.0–15.0)
MCH: 29 pg (ref 26.0–34.0)
MCHC: 32.3 g/dL (ref 30.0–36.0)
MCV: 89.9 fL (ref 80.0–100.0)
Platelets: 156 10*3/uL (ref 150–400)
RBC: 2.48 MIL/uL — ABNORMAL LOW (ref 3.87–5.11)
RDW: 18.6 % — ABNORMAL HIGH (ref 11.5–15.5)
WBC: 13.4 10*3/uL — ABNORMAL HIGH (ref 4.0–10.5)
nRBC: 0.5 % — ABNORMAL HIGH (ref 0.0–0.2)

## 2018-10-01 LAB — HEMOGLOBIN AND HEMATOCRIT, BLOOD
HCT: 22.5 % — ABNORMAL LOW (ref 36.0–46.0)
Hemoglobin: 7.2 g/dL — ABNORMAL LOW (ref 12.0–15.0)

## 2018-10-01 LAB — SARS CORONAVIRUS 2 BY RT PCR (HOSPITAL ORDER, PERFORMED IN ~~LOC~~ HOSPITAL LAB): SARS Coronavirus 2: NEGATIVE

## 2018-10-01 LAB — PROTIME-INR
INR: 1.1 (ref 0.8–1.2)
Prothrombin Time: 13.7 seconds (ref 11.4–15.2)

## 2018-10-01 MED ORDER — LIDOCAINE 5 % EX PTCH
1.0000 | MEDICATED_PATCH | CUTANEOUS | Status: DC
  Administered 2018-10-01 – 2018-10-03 (×3): 1 via TRANSDERMAL
  Filled 2018-10-01 (×3): qty 1

## 2018-10-01 MED ORDER — ALUM & MAG HYDROXIDE-SIMETH 200-200-20 MG/5ML PO SUSP
30.0000 mL | ORAL | Status: DC | PRN
  Administered 2018-10-01: 30 mL via ORAL
  Filled 2018-10-01: qty 30

## 2018-10-01 NOTE — [facility] Care Team (Signed)
Per MD Mody, d/c tele. Tele d/c

## 2018-10-01 NOTE — Progress Notes (Signed)
Physical Therapy Treatment Patient Details Name: AISSATA DELICH MRN: [id number] DOB: 06/18/1950 Today's Date: 10/01/2018    History of Present Illness Angla Gaylan Gerold is a 83yoF who comes [facility] on 8/27 after sustaining a fall at home down on floor for ~8 hours, pt unable to reach her telephone or lifealert. In ED pt found to have Left rib fractures (not individually identified) and Left pubic ramus fracture. PMH Left arm hemiparesis and Left ankle weakness/postural changes s/p CVA. PTA pt was modified independent in ADL, IADL, driving. BIL reports pt's AMB has been slow, choppy and concerning for quite some time. 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 Pt lives alone.    PT Comments    Pt asking for bedpan upon arrival.  Encourage bedside commode but she refused stating she was unable to try.  Assisted to bedpan with mod a x 1 for rolling and positioning.  Voided.  To edge of bed with max a x 1.  PT was able to sit about 3 minutes with supervision before c/o nausea and some dizziness.  Returned to supine with max a x 1 and positioned for comfort.  HW was brought in for pt to try standing but she was opposed to trying it.  Stated she prefers [facility] from home.  Explained differences in stability and safety and she stated "That thing will make me fall."  Will continue with education and trials as appropriate.   Follow Up Recommendations  SNF;Supervision for mobility/OOB     Equipment Recommendations       Recommendations for Other Services       Precautions / Restrictions Precautions Precautions: Fall Precaution Comments: Left rib fractures, hematoma, c dropping H&H Restrictions Weight Bearing Restrictions: No    Mobility  Bed Mobility Overal bed mobility: Needs Assistance Bed Mobility: Rolling;Supine to Sit;Sit to Supine Rolling: Mod assist   Supine to sit: Max assist Sit to supine: Max assist   General bed mobility comments: limited by  rib pain  Transfers                 General transfer comment: deferred  Ambulation/Gait             General Gait Details: unable   Stairs             Wheelchair Mobility    Modified Rankin (Stroke Patients Only)       Balance Overall balance assessment: Needs assistance Sitting-balance support: Feet supported Sitting balance-Leahy Scale: Fair Sitting balance - Comments: uansfe to bed left unattended but can sit unsupported with supervision                                    Cognition Arousal/Alertness: Awake/alert Behavior During Therapy: WFL for tasks assessed/performed Overall Cognitive Status: Within Functional Limits for tasks assessed                                        Exercises Other Exercises Other Exercises: bedpan to void Other Exercises: sat EOB x 3 minutes    General Comments        Pertinent Vitals/Pain Pain Assessment: Faces Faces Pain Scale: Hurts even more Pain Location: L chest/ribs with mobility Pain Descriptors / Indicators: Sore;Guarding;Grimacing Pain Intervention(s): Monitored during session;Premedicated before session;Limited activity within patient's  tolerance    Home Living                      Prior Function            PT Goals (current goals can now be found in the care plan section) Progress towards PT goals: Progressing toward goals    Frequency    7X/week      PT Plan Current plan remains appropriate    Co-evaluation              AM-PAC PT "6 Clicks" Mobility   Outcome Measure  Help needed turning from your back to your side while in a flat bed without using bedrails?: A Lot Help needed moving from lying on your back to sitting on the side of a flat bed without using bedrails?: Total Help needed moving to and from a bed to a chair (including a wheelchair)?: Total Help needed standing up from a chair using your arms (e.g., wheelchair or bedside  chair)?: Total Help needed to walk in hospital room?: Total Help needed climbing 3-5 steps with a railing? : Total 6 Click Score: 7    End of Session   Activity Tolerance: Patient limited by pain Patient left: in bed;with call bell/phone within reach;with bed alarm set Nurse Communication: Mobility status       Time: [id number] PT Time Calculation (min) (ACUTE ONLY): 17 min  Charges:  $Therapeutic Activity: 8-22 mins                    Chesley Noon, PTA 10/01/18, 1:17 PM

## 2018-10-01 NOTE — Progress Notes (Signed)
[place] for warfarin Indication: atrial fibrillation  Allergies  Allergen Reactions  . Aspirin Other (See Comments)    ulcer  . Sulfa Antibiotics Other (See Comments)    unknown    Patient Measurements: Height: 4\' 7"  (139.7 cm) Weight: 95 lb 7.4 oz (43.3 kg) IBW/kg (Calculated) : 34  Vital Signs: Temp: 98.1 F (36.7 C) (08/30 0734) Temp Source: Oral (08/30 0734) BP: 129/56 (08/30 1030) Pulse Rate: 96 (08/30 1030)  Labs: Recent Labs    09/28/18 2136  09/29/18 0412  09/30/18 0447  09/30/18 2008 10/01/18 0008 10/01/18 0443  HGB 10.7*  --  9.1*   < > 7.8*   < > 7.3* 7.2* 7.2*  HCT 33.3*  --  27.8*   < > 23.5*   < > 22.8* 22.5* 22.3*  PLT 290  --  305  --   --   --   --   --  156  LABPROT  --    < > 67.4*  --  13.7  --   --   --  13.7  INR  --    < > 8.3*  --  1.1  --   --   --  1.1  CREATININE 1.51*  --  1.74*  --   --   --   --   --  0.79  CKTOTAL 363*  --   --   --   --   --   --   --   --   TROPONINIHS 31*  --   --   --   --   --   --   --   --    < > = values in this interval not displayed.    Estimated Creatinine Clearance: 39.5 mL/min (by C-G formula based on SCr of 0.79 mg/dL).   Medical History: Past Medical History:  Diagnosis Date  . Aneurysm ([place])   . Anxiety   . Arthritis   . Cancer ([place])   . CHF (congestive heart failure) ([place])   . COPD (chronic obstructive pulmonary disease) ([place])   . Hyperlipidemia   . Hypertension   . Stroke ([place])   . Thyroid disease     Medications:  Scheduled:  . sodium chloride   Intravenous Once  . amLODipine  10 mg Oral Daily  . arformoterol  15 mcg Nebulization BID  . atorvastatin  40 mg Oral Daily  . docusate sodium  100 mg Oral BID  . fenofibrate  145 mg Oral Daily  . levothyroxine  50 mcg Oral QAC breakfast  . lidocaine  1 patch Transdermal Q24H  . metoprolol tartrate  50 mg Oral BID  . polyethylene glycol  17 g Oral Daily    Assessment: Patient here for hip pain  and has h/o afib anticoagulated w/ warfarin PTA. Please see PTA med list for schedule.     On 8/28, INR trended upwards from 7.5 to 8.3.  INR is currently critical at 8.3.  Per nurse, pt has hematoma on hip.  8/28 @ 0652. Patient received vit K 2.5 mg PO x 1 dose. IV vit K 5 mg x 1 dose  has been ordered, and she will receive 1 unit pack RBC transfusion.    Per Med Rec: Patient on Warfarin 2 mg T,Th, S,S and 3 mg M,W,F  8/28 INR 8.3 Dose Held 8/29 INR 1.1  Dose held 8/30 INR 1.1 Hold and resume as outpt   Goal of  Therapy:  INR 2 - 3 Monitor platelets by anticoagulation protocol: Yes   Plan:  Per MD note 8/30: Due to anemia and extensive bruising we are holding Coumadin.  This can be restarted as outpatient.  This was discussed with the patient.  She is aware that there is a potential for CVA risk however due to the extensive bruising we would like to hold off on Coumadin   Chinita Greenland PharmD Clinical Pharmacist 10/01/2018

## 2018-10-01 NOTE — Progress Notes (Signed)
[place] at [place] NAME: Teresa Holmes    MR#:  [id number]  DATE OF BIRTH:  1950/02/27  SUBJECTIVE:  No acute issues overnight.  Hemoglobin remained stable.  Patient is thinking about skilled nursing facility placement.  She would like lidocaine patch for her back pain REVIEW OF SYSTEMS:    Review of Systems  Constitutional: Negative for fever, chills weight loss HENT: Negative for ear pain, nosebleeds, congestion, facial swelling, rhinorrhea, neck pain, neck stiffness and ear discharge.   Respiratory: Negative for cough, shortness of breath, wheezing  Cardiovascular: Negative for chest pain, palpitations and leg swelling.  Gastrointestinal: Negative for heartburn, abdominal pain, vomiting, diarrhea or consitpation Genitourinary: Negative for dysuria, urgency, frequency, hematuria Musculoskeletal: + pelvic pain  Neurological: Negative for dizziness, seizures, syncope, focal weakness,  numbness and headaches.  Hematological: Does not bruise/bleed easily.  Psychiatric/Behavioral: Negative for hallucinations, confusion, dysphoric mood    Tolerating Diet: yes      DRUG ALLERGIES:   Allergies  Allergen Reactions  . Aspirin Other (See Comments)    ulcer  . Sulfa Antibiotics Other (See Comments)    unknown    VITALS:  Blood pressure (!) 129/56, pulse 96, temperature 98.1 F (36.7 C), temperature source Oral, resp. rate 20, height 4\' 7"  (1.397 m), weight 43.3 kg, SpO2 96 %.  PHYSICAL EXAMINATION:  Constitutional: Appears well-developed and well-nourished. No distress. HENT: Normocephalic. Marland Kitchen Oropharynx is clear and moist.  Eyes: Conjunctivae and EOM are normal. PERRLA, no scleral icterus.  Neck: Normal ROM. Neck supple. No JVD. No tracheal deviation. CVS: RRR, S1/S2 +, no murmurs, no gallops, no carotid bruit.  Pulmonary: Normal respiratory effort with bilateral upper airway rhonchi no wheezing  Abdominal: Soft. BS +,  no  distension, tenderness, rebound or guarding.  Musculoskeletal: Normal range of motion. No edema and no tenderness.  Neuro: Alert. CN 2-12 grossly intact.  Left arm weakness from previous CVA LLE 4/5  Skin: Extensive bruising all around pelvis extending beyond marking Psychiatric: Normal mood and affect.      LABORATORY PANEL:   CBC Recent Labs  Lab 10/01/18 0443  WBC 13.4*  HGB 7.2*  HCT 22.3*  PLT 156   ------------------------------------------------------------------------------------------------------------------  Chemistries  Recent Labs  Lab 09/28/18 2136 09/29/18 0412 10/01/18 0443  NA 136 136 139  K 3.6 3.6 3.9  CL 102 103 107  CO2 19* 23 26  GLUCOSE 210* 189* 128*  BUN 33* 42* 20  CREATININE 1.51* 1.74* 0.79  CALCIUM 8.8* 8.4* 8.0*  MG  --  2.1  --   AST 34  --   --   ALT 22  --   --   ALKPHOS 47  --   --   BILITOT 1.5*  --   --    ------------------------------------------------------------------------------------------------------------------  Cardiac Enzymes No results for input(s): TROPONINI in the last 168 hours. ------------------------------------------------------------------------------------------------------------------  RADIOLOGY:  Dg Abd 1 View  Result Date: 09/30/2018 CLINICAL DATA:  Ileus. EXAM: ABDOMEN - 1 VIEW COMPARISON:  09/29/2018 FINDINGS: Potentially mild residual ileus involving colon and small bowel but no significant dilated bowel loops, obstructive pattern or signs of free air. No abnormal calcifications. IMPRESSION: No evidence of bowel obstruction.  Potential mild residual ileus. Electronically Signed   By: Aletta Edouard M.D.   On: 09/30/2018 09:04     ASSESSMENT AND PLAN:   68 y/o female with CVA and residual weakness left side presented to ED after a mechanical fall and left hip  pain.  1. Mechanical fall with subsequent  nondisplaced left inferior pubic ramus fracture/left rib fracture: This is non  operable Incentive spirometer at bedside Pain management   2. Acute blood loss anemia with large hematoma after mechanical fall while on Coumadin: Patient received 1 unit PRBC and FFP.  Hemoglobin remained stable.  No blood transfusion today.  Continue monitor hemoglobin.   3.  Ileus: Clinically this has resolved.    4.  Acute kidney injury in the setting dehydration from mechanical fall: Repeat creatinine in am  5.  Rhythm: Continue Synthroid  6.  Essential hypertension: Continue Norvasc and metoprolol 7.  History of stroke with left-sided residual weakness: Continue atorvastatin Coumadin on hold for now  8.  PAF: Heart rate better controlled. Due to anemia and extensive bruising we are holding Coumadin.  This can be restarted as outpatient.  This was discussed with the patient.  She is aware that there is a potential for CVA risk however due to the extensive bruising we would like to hold off on Coumadin.   Continue Metoprolol for heart rate control  9.  Acute hypoxic respiratory failure in the setting of poor inspiration from left-sided rib fractures. Wean oxygen as tolerated Stop IV fluids Incentive spirometer Chest x-ray for a.m.  Clinical social worker consultation placed for discharge planning to skilled nursing facility.  COVID testing ordered.  Management plans discussed with the patient and she is in agreement.  CODE STATUS: DNR  TOTAL TIME TAKING CARE OF THIS PATIENT: 28 minutes.     POSSIBLE D/C tomorrow to SNF, DEPENDING ON CLINICAL CONDITION.   Bettey Costa M.D on 10/01/2018 at 10:33 AM  Between 7am to 6pm - Pager - [phone] After 6pm go to [email or web address] - password [email or web address] [facility]  Office  [phone]  CC: Primary care physician; Ronnell Freshwater, NP  Note: This dictation was prepared with Dragon dictation along with smaller phrase technology. Any transcriptional errors that result from this process are unintentional.

## 2018-10-02 LAB — CBC
HCT: 22.9 % — ABNORMAL LOW (ref 36.0–46.0)
Hemoglobin: 7.1 g/dL — ABNORMAL LOW (ref 12.0–15.0)
MCH: 28.9 pg (ref 26.0–34.0)
MCHC: 31 g/dL (ref 30.0–36.0)
MCV: 93.1 fL (ref 80.0–100.0)
Platelets: 183 10*3/uL (ref 150–400)
RBC: 2.46 MIL/uL — ABNORMAL LOW (ref 3.87–5.11)
RDW: 18.6 % — ABNORMAL HIGH (ref 11.5–15.5)
WBC: 12.9 10*3/uL — ABNORMAL HIGH (ref 4.0–10.5)
nRBC: 1.1 % — ABNORMAL HIGH (ref 0.0–0.2)

## 2018-10-02 MED ORDER — ACETAMINOPHEN 325 MG PO TABS: 650.0000 mg | ORAL_TABLET | Freq: Four times a day (QID) | ORAL | 0 refills | Status: DC | PRN

## 2018-10-02 MED ORDER — FERROUS SULFATE 325 (65 FE) MG PO TABS: 325.0000 mg | ORAL_TABLET | Freq: Every day | ORAL | 3 refills | Status: DC

## 2018-10-02 MED ORDER — LIDOCAINE 5 % EX PTCH: 1.0000 | MEDICATED_PATCH | CUTANEOUS | 0 refills | Status: DC

## 2018-10-02 MED ORDER — AMLODIPINE BESYLATE 10 MG PO TABS: 10.0000 mg | ORAL_TABLET | Freq: Every day | ORAL | 0 refills | Status: DC

## 2018-10-02 NOTE — Discharge Summary (Addendum)
[facility] [place] NAME: Teresa Holmes    MR#:  [id number]  DATE OF BIRTH:  1950/10/04  DATE OF ADMISSION:  09/28/2018 ADMITTING PHYSICIAN: Christel Mormon, MD  DATE OF DISCHARGE: 10/03/2018  PRIMARY CARE PHYSICIAN: Ronnell Freshwater, NP    ADMISSION DIAGNOSIS:  Elevated troponin I level [R79.89] AKI (acute kidney injury) ([place]) [N17.9] Fall, initial encounter [W19.XXXA]  DISCHARGE DIAGNOSIS:  Active Problems:   Pelvic fracture ([place])   SECONDARY DIAGNOSIS:   Past Medical History:  Diagnosis Date  . Aneurysm ([place])   . Anxiety   . Arthritis   . Cancer ([place])   . CHF (congestive heart failure) ([place])   . COPD (chronic obstructive pulmonary disease) ([place])   . Hyperlipidemia   . Hypertension   . Stroke ([place])   . Thyroid disease     HOSPITAL COURSE:  68 y/o female with CVA and residual weakness left side presented to ED after a mechanical fall and left hip pain.  1. Mechanical fall with subsequent  nondisplaced left inferior pubic ramus fracture/left rib fractures: This is non operable Continue to use Incentive spirometer. Continue PT/OT  2. Acute blood loss anemia with large hematoma after mechanical fall while on Coumadin on chronic anemia: Patient received 1 unit PRBC and FFP.  Hemoglobin has  remained stable.  She will be discharged on ferrous sulfate. Discharge hemoglobin 7.1   3.  Mild Ileus: Clinically this has resolved.    4.  Acute kidney injury in the setting dehydration from mechanical fall: This is resolved. 5.    Hypothyroid: Continue Synthroid  6.  Essential hypertension: Continue Norvasc and metoprolol 7.  History of stroke with left-sided residual weakness: Continue atorvastatin Coumadin on hold for now  8.  PAF:  Due to anemia and extensive bruising we are holding Coumadin.  This can be restarted as outpatient.  This was discussed with the patient.  She is aware that there is a potential for CVA risk  however due to the extensive bruising we would like to hold off on Coumadin.   Continue Metoprolol for heart rate control  9.  Acute hypoxic respiratory failure in the setting of poor inspiration from left-sided rib fractures. Continue titrating oxygen.  Incentive spirometer   DISCHARGE CONDITIONS AND DIET:   Stable for discharge regular diet  CONSULTS OBTAINED:    DRUG ALLERGIES:   Allergies  Allergen Reactions  . Aspirin Other (See Comments)    ulcer  . Sulfa Antibiotics Other (See Comments)    unknown    DISCHARGE MEDICATIONS:   Allergies as of 10/03/2018      Reactions   Aspirin Other (See Comments)   ulcer   Sulfa Antibiotics Other (See Comments)   unknown      Medication List    STOP taking these medications   diazepam 2 MG tablet Commonly known as: VALIUM   HYDROcodone-acetaminophen 5-325 MG tablet Commonly known as: Norco   nitrofurantoin (macrocrystal-monohydrate) 100 MG capsule Commonly known as: Macrobid   warfarin 2 MG tablet Commonly known as: COUMADIN   warfarin 3 MG tablet Commonly known as: COUMADIN     TAKE these medications   acetaminophen 325 MG tablet Commonly known as: TYLENOL Take 2 tablets (650 mg total) by mouth every 6 (six) hours as needed for mild pain (or Fever >/= 101).   albuterol 108 (90 Base) MCG/ACT inhaler Commonly known as: Ventolin HFA Inhale 2 puffs into the lungs every 4 (four) hours  as needed for wheezing or shortness of breath.   arformoterol 15 MCG/2ML Nebu Commonly known as: BROVANA Take 2 mLs (15 mcg total) by nebulization 2 (two) times daily.   atorvastatin 40 MG tablet Commonly known as: LIPITOR Take 1 tablet (40 mg total) by mouth daily.   fenofibrate 145 MG tablet Commonly known as: TRICOR Take 1 tablet (145 mg total) by mouth daily.   ferrous sulfate 325 (65 FE) MG tablet Commonly known as: FerrouSul Take 1 tablet (325 mg total) by mouth daily with breakfast.   levothyroxine 50 MCG  tablet Commonly known as: SYNTHROID Take 1 tablet (50 mcg total) by mouth daily before breakfast.   lidocaine 5 % Commonly known as: LIDODERM Place 1 patch onto the skin daily. Remove & Discard patch within 12 hours or as directed by MD   metoprolol tartrate 25 MG tablet Commonly known as: LOPRESSOR Take 0.5 tablets (12.5 mg total) by mouth 2 (two) times daily.   oxyCODONE 5 MG immediate release tablet Commonly known as: Oxy IR/ROXICODONE Take 1 tablet (5 mg total) by mouth every 4 (four) hours as needed for severe pain.         Today   CHIEF COMPLAINT:   No acute issues overnight   VITAL SIGNS:  Blood pressure (!) 120/55, pulse 91, temperature (!) 97.5 F (36.4 C), temperature source Oral, resp. rate 19, height 4\' 7"  (1.397 m), weight 43.3 kg, SpO2 95 %.   REVIEW OF SYSTEMS:  Review of Systems  Constitutional: Positive for malaise/fatigue. Negative for chills and fever.  HENT: Negative.  Negative for ear discharge, ear pain, hearing loss, nosebleeds and sore throat.   Eyes: Negative.  Negative for blurred vision and pain.  Respiratory: Positive for cough. Negative for hemoptysis, shortness of breath and wheezing.   Cardiovascular: Negative.  Negative for chest pain, palpitations and leg swelling.  Gastrointestinal: Negative.  Negative for abdominal pain, blood in stool, diarrhea, nausea and vomiting.  Genitourinary: Negative.  Negative for dysuria.  Musculoskeletal: Negative.  Negative for back pain.  Skin: Negative.        Extensive bruisong after fall   Neurological: Negative for dizziness, tremors, speech change, focal weakness, seizures and headaches.  Endo/Heme/Allergies: Negative.  Does not bruise/bleed easily.  Psychiatric/Behavioral: Negative.  Negative for depression, hallucinations and suicidal ideas.     PHYSICAL EXAMINATION:  GENERAL:  68 y.o.-year-old patient lying in the bed with no acute distress.  NECK:  Supple, no jugular venous distention. No  thyroid enlargement, no tenderness.  LUNGS: Upper airway rhonchi no wheezing rales CARDIOVASCULAR: S1, S2 normal. No murmurs, rubs, or gallops.  ABDOMEN: Soft, non-tender, non-distended. Bowel sounds present. No organomegaly or mass.  EXTREMITIES: No pedal edema, cyanosis, or clubbing.  PSYCHIATRIC: The patient is alert and oriented x 3.  SKIN: Extensive ecchymosis around pelvic area which extends further than marked area  DATA REVIEW:   CBC Recent Labs  Lab 10/02/18 0448  WBC 12.9*  HGB 7.1*  HCT 22.9*  PLT 183    Chemistries  Recent Labs  Lab 09/28/18 2136 09/29/18 0412 10/01/18 0443  NA 136 136 139  K 3.6 3.6 3.9  CL 102 103 107  CO2 19* 23 26  GLUCOSE 210* 189* 128*  BUN 33* 42* 20  CREATININE 1.51* 1.74* 0.79  CALCIUM 8.8* 8.4* 8.0*  MG  --  2.1  --   AST 34  --   --   ALT 22  --   --   ALKPHOS 47  --   --  BILITOT 1.5*  --   --     Cardiac Enzymes No results for input(s): TROPONINI in the last 168 hours.  Microbiology Results  @MICRORSLT48 @  RADIOLOGY:  No results found.    Allergies as of 10/03/2018      Reactions   Aspirin Other (See Comments)   ulcer   Sulfa Antibiotics Other (See Comments)   unknown      Medication List    STOP taking these medications   diazepam 2 MG tablet Commonly known as: VALIUM   HYDROcodone-acetaminophen 5-325 MG tablet Commonly known as: Norco   nitrofurantoin (macrocrystal-monohydrate) 100 MG capsule Commonly known as: Macrobid   warfarin 2 MG tablet Commonly known as: COUMADIN   warfarin 3 MG tablet Commonly known as: COUMADIN     TAKE these medications   acetaminophen 325 MG tablet Commonly known as: TYLENOL Take 2 tablets (650 mg total) by mouth every 6 (six) hours as needed for mild pain (or Fever >/= 101).   albuterol 108 (90 Base) MCG/ACT inhaler Commonly known as: Ventolin HFA Inhale 2 puffs into the lungs every 4 (four) hours as needed for wheezing or shortness of breath.   arformoterol  15 MCG/2ML Nebu Commonly known as: BROVANA Take 2 mLs (15 mcg total) by nebulization 2 (two) times daily.   atorvastatin 40 MG tablet Commonly known as: LIPITOR Take 1 tablet (40 mg total) by mouth daily.   fenofibrate 145 MG tablet Commonly known as: TRICOR Take 1 tablet (145 mg total) by mouth daily.   ferrous sulfate 325 (65 FE) MG tablet Commonly known as: FerrouSul Take 1 tablet (325 mg total) by mouth daily with breakfast.   levothyroxine 50 MCG tablet Commonly known as: SYNTHROID Take 1 tablet (50 mcg total) by mouth daily before breakfast.   lidocaine 5 % Commonly known as: LIDODERM Place 1 patch onto the skin daily. Remove & Discard patch within 12 hours or as directed by MD   metoprolol tartrate 25 MG tablet Commonly known as: LOPRESSOR Take 0.5 tablets (12.5 mg total) by mouth 2 (two) times daily.   oxyCODONE 5 MG immediate release tablet Commonly known as: Oxy IR/ROXICODONE Take 1 tablet (5 mg total) by mouth every 4 (four) hours as needed for severe pain.          Management plans discussed with the patient and she is in agreement. Stable for discharge   Patient should follow up with pcp  CODE STATUS:     Code Status Orders  (From admission, onward)         Start     Ordered   09/29/18 0955  Do not attempt resuscitation (DNR)  Continuous    Question Answer Comment  In the event of cardiac or respiratory ARREST Do not call a "code blue"   In the event of cardiac or respiratory ARREST Do not perform Intubation, CPR, defibrillation or ACLS   In the event of cardiac or respiratory ARREST Use medication by any route, position, wound care, and other measures to relive pain and suffering. May use oxygen, suction and manual treatment of airway obstruction as needed for comfort.      09/29/18 0954        Code Status History    Date Active Date Inactive Code Status Order ID Comments User Context   09/28/2018 2359 09/29/2018 0954 Full Code [id number]   Mansy, Arvella Merles, MD ED   Advance Care Planning Activity      TOTAL TIME TAKING CARE OF  THIS PATIENT: 38 minutes.    Note: This dictation was prepared with Dragon dictation along with smaller phrase technology. Any transcriptional errors that result from this process are unintentional.  Bettey Costa M.D on 10/03/2018 at 8:42 AM  Between 7am to 6pm - Pager - [phone] After 6pm go to [email or web address] - password [email or web address] [facility]  Office  [phone]  CC: Primary care physician; Ronnell Freshwater, NP

## 2018-10-02 NOTE — TOC Progression Note (Signed)
Transition of Care [facility]) - Progression Note    Patient Details  Name: Teresa Holmes MRN: [id number] Date of Birth: 09/02/1950  Transition of Care [facility]) CM/SW [place], RN Phone Number: 10/02/2018, 12:24 PM  Clinical Narrative:     Patient agreed to do a bed search for rehab, I sent out a bedsearch thru the Hub and obtained a PASSR number       Expected Discharge Plan and Services           Expected Discharge Date: 10/02/18                                     Social Determinants of Health (SDOH) Interventions    Readmission Risk Interventions No flowsheet data found.

## 2018-10-02 NOTE — Care Management Important Message (Signed)
Important Message  Patient Details  Name: Teresa Holmes MRN: [id number] Date of Birth: Feb 07, 1950   Medicare Important Message Given:  Yes     Juliann Pulse A Neidra Girvan 10/02/2018, 11:21 AM

## 2018-10-02 NOTE — NC FL2 (Signed)
[place] LEVEL OF CARE SCREENING TOOL     IDENTIFICATION  Patient Name: Teresa Holmes Birthdate: 05/12/1950 Sex: female Admission Date (Current Location): 09/28/2018  [place] and [place] Number:  Engineering geologist and Address:  [facility], [address] [place],  [address]      Provider Number: [id number]  Attending Physician Name and Address:  Bettey Costa, MD  Relative Name and Phone Number:  Brother in law Durene Fruits [phone]    Current Level of Care: Hospital Recommended Level of Care: [place] Prior Approval Number:    Date Approved/Denied:   PASRR Number: [id number]  Discharge Plan: SNF    Current Diagnoses: Patient Active Problem List   Diagnosis Date Noted  . Pelvic fracture ([place]) 09/28/2018  . Encounter for long-term (current) use of medications 05/11/2017  . Chronic pain disorder 05/11/2017  . Generalized anxiety disorder 03/14/2017  . Essential (primary) hypertension 03/14/2017  . Hemiplegia and hemiparesis following cerebral infarction affecting left non-dominant side ([place]) 03/14/2017  . Vitamin D deficiency, unspecified 03/14/2017  . Personal history of venous thrombosis and embolism 03/14/2017  . Congenital spondylolisthesis 03/14/2017  . Nicotine dependence, cigarettes, uncomplicated [id number]  . Mixed hyperlipidemia 03/14/2017  . Long term (current) use of anticoagulants 03/14/2017  . Other intervertebral disc degeneration, thoracolumbar region 03/14/2017  . Lumbago with sciatica 03/14/2017  . Cardiac arrhythmia 03/14/2017  . Myalgia 03/14/2017  . Hypothyroidism 03/14/2017  . Difficulty walking 03/14/2017  . Left knee pain 03/14/2017    Orientation RESPIRATION BLADDER Height & Weight     Self, Time, Situation, Place  Normal Continent Weight: 43.3 kg Height:  4\' 7"  (139.7 cm)  BEHAVIORAL SYMPTOMS/MOOD NEUROLOGICAL BOWEL NUTRITION STATUS      Continent    AMBULATORY STATUS  COMMUNICATION OF NEEDS Skin   Extensive Assist Verbally Normal                       Personal Care Assistance Level of Assistance  Bathing, Dressing Bathing Assistance: Limited assistance   Dressing Assistance: Limited assistance     Functional Limitations Info  Sight, Hearing, Speech Sight Info: Adequate Hearing Info: Adequate Speech Info: Adequate    SPECIAL CARE FACTORS FREQUENCY                       Contractures Contractures Info: Not present    Additional Factors Info  Code Status, Allergies Code Status Info: DNR Allergies Info: aspirin, sulfa drugs           Current Medications (10/02/2018):  This is the current hospital active medication list Current Facility-Administered Medications  Medication Dose Route Frequency Provider Last Rate Last Dose  . 0.9 %  sodium chloride infusion (Manually program via Guardrails IV Fluids)   Intravenous Once Hillary Bow, MD      . acetaminophen (TYLENOL) tablet 650 mg  650 mg Oral Q6H PRN Mansy, Jan A, MD   650 mg at 09/30/18 [id number]   Or  . acetaminophen (TYLENOL) suppository 650 mg  650 mg Rectal Q6H PRN Mansy, Jan A, MD      . albuterol (PROVENTIL) (2.5 MG/3ML) 0.083% nebulizer solution 2.5 mg  2.5 mg Inhalation Q4H PRN Mansy, Jan A, MD      . alum & mag hydroxide-simeth (MAALOX/MYLANTA) 200-200-20 MG/5ML suspension 30 mL  30 mL Oral Q4H PRN Bettey Costa, MD   30 mL at 10/01/18 2320  . amLODipine (NORVASC) tablet 10 mg  10 mg Oral Daily Hillary Bow, MD   10 mg at 10/02/18 [phone]  . arformoterol (BROVANA) nebulizer solution 15 mcg  15 mcg Nebulization BID Hillary Bow, MD   15 mcg at 10/01/18 2127  . atorvastatin (LIPITOR) tablet 40 mg  40 mg Oral Daily Mansy, Jan A, MD   40 mg at 10/01/18 1854  . cefTRIAXone (ROCEPHIN) 1 g in sodium chloride 0.9 % 100 mL IVPB  1 g Intravenous Q24H Mansy, Jan A, MD 200 mL/hr at 10/02/18 0108 1 g at 10/02/18 0108  . diazepam (VALIUM) tablet 2 mg  2 mg Oral QHS PRN Mansy, Jan A, MD       . docusate sodium (COLACE) capsule 100 mg  100 mg Oral BID Hillary Bow, MD   100 mg at 10/02/18 [id number]  . fenofibrate (TRICOR) tablet 145 mg  145 mg Oral Daily Mansy, Jan A, MD      . HYDROcodone-acetaminophen (NORCO/VICODIN) 5-325 MG per tablet 1 tablet  1 tablet Oral TID PRN Mansy, Arvella Merles, MD   1 tablet at 10/02/18 0833  . labetalol (NORMODYNE) injection 10 mg  10 mg Intravenous Q4H PRN Hillary Bow, MD   10 mg at 09/29/18 1446  . levothyroxine (SYNTHROID) tablet 50 mcg  50 mcg Oral QAC breakfast Mansy, Jan A, MD   50 mcg at 10/02/18 [phone]  . lidocaine (LIDODERM) 5 % 1 patch  1 patch Transdermal Q24H Bettey Costa, MD   1 patch at 10/02/18 1125  . metoprolol tartrate (LOPRESSOR) injection 5 mg  5 mg Intravenous Q4H PRN Hillary Bow, MD   5 mg at 09/29/18 1302  . metoprolol tartrate (LOPRESSOR) tablet 50 mg  50 mg Oral BID Hillary Bow, MD   50 mg at 10/02/18 0834  . ondansetron (ZOFRAN) tablet 4 mg  4 mg Oral Q6H PRN Mansy, Jan A, MD       Or  . ondansetron [facility]) injection 4 mg  4 mg Intravenous Q6H PRN Mansy, Jan A, MD   4 mg at 09/29/18 0941  . oxyCODONE (Oxy IR/ROXICODONE) immediate release tablet 5 mg  5 mg Oral Q4H PRN Sudini, Srikar, MD      . polyethylene glycol (MIRALAX / GLYCOLAX) packet 17 g  17 g Oral Daily Hillary Bow, MD   17 g at 10/02/18 [id number]  . promethazine (PHENERGAN) injection 12.5 mg  12.5 mg Intravenous Q6H PRN Hillary Bow, MD   12.5 mg at 09/29/18 1427  . traZODone (DESYREL) tablet 25 mg  25 mg Oral QHS PRN Mansy, Arvella Merles, MD         Discharge Medications: Please see discharge summary for a list of discharge medications.  Relevant Imaging Results:  Relevant Lab Results:   Additional Information [id number]  Su Hilt, RN

## 2018-10-02 NOTE — Progress Notes (Signed)
[facility] [place] NAME: Teresa Holmes    MR#:  [id number]  DATE OF BIRTH:  [birth year]  SUBJECTIVE:  Doing ok this am  REVIEW OF SYSTEMS:    Review of Systems  Constitutional: Negative for fever, chills weight loss HENT: Negative for ear pain, nosebleeds, congestion, facial swelling, rhinorrhea, neck pain, neck stiffness and ear discharge.   Respiratory: Negative for cough, shortness of breath, wheezing  Cardiovascular: Negative for chest pain, palpitations and leg swelling.  Gastrointestinal: Negative for heartburn, abdominal pain, vomiting, diarrhea or consitpation Genitourinary: Negative for dysuria, urgency, frequency, hematuria Musculoskeletal: + pelvic pain  Neurological: Negative for dizziness, seizures, syncope, focal weakness,  numbness and headaches.  Hematological: Does not bruise/bleed easily.  Psychiatric/Behavioral: Negative for hallucinations, confusion, dysphoric mood    Tolerating Diet: yes      DRUG ALLERGIES:   Allergies  Allergen Reactions  . Aspirin Other (See Comments)    ulcer  . Sulfa Antibiotics Other (See Comments)    unknown    VITALS:  Blood pressure 118/61, pulse 76, temperature 99.9 F (37.7 C), temperature source Oral, resp. rate 15, height 4\' 7"  (1.397 m), weight 43.3 kg, SpO2 93 %.  PHYSICAL EXAMINATION:  Constitutional: Appears well-developed and well-nourished. No distress. HENT: Normocephalic. Marland Kitchen Oropharynx is clear and moist.  Eyes: Conjunctivae and EOM are normal. PERRLA, no scleral icterus.  Neck: Normal ROM. Neck supple. No JVD. No tracheal deviation. CVS: RRR, S1/S2 +, no murmurs, no gallops, no carotid bruit.  Pulmonary: Normal respiratory effort with bilateral upper airway rhonchi no wheezing  Abdominal: Soft. BS +,  no distension, tenderness, rebound or guarding.  Musculoskeletal: Normal range of motion. No edema and no tenderness.  Neuro: Alert. CN 2-12 grossly intact.  Left  arm weakness from previous CVA LLE 4/5  Skin: Extensive bruising all around pelvis extending beyond marking Psychiatric: Normal mood and affect.      LABORATORY PANEL:   CBC Recent Labs  Lab 10/02/18 0448  WBC 12.9*  HGB 7.1*  HCT 22.9*  PLT 183   ------------------------------------------------------------------------------------------------------------------  Chemistries  Recent Labs  Lab 09/28/18 2136 09/29/18 0412 10/01/18 0443  NA 136 136 139  K 3.6 3.6 3.9  CL 102 103 107  CO2 19* 23 26  GLUCOSE 210* 189* 128*  BUN 33* 42* 20  CREATININE 1.51* 1.74* 0.79  CALCIUM 8.8* 8.4* 8.0*  MG  --  2.1  --   AST 34  --   --   ALT 22  --   --   ALKPHOS 47  --   --   BILITOT 1.5*  --   --    ------------------------------------------------------------------------------------------------------------------  Cardiac Enzymes No results for input(s): TROPONINI in the last 168 hours. ------------------------------------------------------------------------------------------------------------------  RADIOLOGY:  No results found.   ASSESSMENT AND PLAN:   68 y/o female with CVA and residual weakness left side presented to ED after a mechanical fall and left hip pain.  1. Mechanical fall with subsequent nondisplaced left inferior pubic ramus fracture/left rib fractures: This is non operable Continue to use Incentive spirometer. Continue PT/OT  2. Acute blood loss anemia with large hematoma after mechanical fall while on Coumadin on chronic anemia: Patient received 1 unit PRBC and FFP. Hemoglobin has  remained stable. She will be discharged on ferrous sulfate. Discharge hemoglobin 7.1   3.  MildIleus: Clinically this has resolved.  4. Acute kidney injury in the setting dehydration from mechanical fall: This is resolved. 5.  Hypothyroid: Continue Synthroid  6. Essential hypertension: Continue Norvasc and metoprolol 7. History of stroke with  left-sided residual weakness: Continue atorvastatin Coumadin on hold for now  8. PAF:  Due to anemia and extensive bruising we are holding Coumadin. This can be restarted as outpatient. This was discussed with the patient. She is aware that there is a potential for CVA risk however due to the extensive bruising we would like to hold off on Coumadin.   ContinueMetoprolol for heart rate control  9. Acute hypoxic respiratory failure in the setting of poor inspiration from left-sidedribfractures. Continue titrating oxygen.  Incentive spirometer Clinical social worker consultation placed for discharge planning to skilled nursing facility.  COVID testing ordered.  Management plans discussed with the patient and she is in agreement.  CODE STATUS: DNR  TOTAL TIME TAKING CARE OF THIS PATIENT: 25 minutes.     POSSIBLE D/C today to SNF, DEPENDING ON CLINICAL CONDITION.   Bettey Costa M.D on 10/02/2018 at 11:40 AM  Between 7am to 6pm - Pager - [phone] After 6pm go to [email or web address] - password [email or web address] [facility]  Office  [phone]  CC: Primary care physician; Ronnell Freshwater, NP  Note: This dictation was prepared with Dragon dictation along with smaller phrase technology. Any transcriptional errors that result from this process are unintentional.
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
## 2018-10-02 NOTE — Progress Notes (Addendum)
OT Cancellation Note  Patient Details Name: Teresa Holmes MRN: [id number] DOB: 04/13/1950   Cancelled Treatment:    Reason Eval/Treat Not Completed: Other (comment). Consult received, chart reviewed. Pt meeting with case mgt upon initial attempt. Will re-attempt OT evaluation at later date/time as pt is available and medically appropriate.   On 2nd attempt, pt sleeping soundly. Will re-attempt next date.  Jeni Salles, MPH, MS, OTR/L ascom [phone] 10/02/18, 3:55 PM

## 2018-10-02 NOTE — TOC Transition Note (Signed)
Transition of Care [facility]) - CM/SW Discharge Note   Patient Details  Name: Teresa Holmes MRN: [id number] Date of Birth: 1950-09-29  Transition of Care [facility]) CM/SW Contact:  Su Hilt, RN Phone Number: 10/02/2018, 4:17 PM   Clinical Narrative:     Patient to DC tomorrow after fever free for 24 hours to [facility].   Final next level of care: [place]     Patient Goals and CMS Choice        Discharge Placement                       Discharge Plan and Services                                     Social Determinants of Health (SDOH) Interventions     Readmission Risk Interventions No flowsheet data found.

## 2018-10-03 DIAGNOSIS — E038 Other specified hypothyroidism: Secondary | ICD-10-CM | POA: Diagnosis not present

## 2018-10-03 DIAGNOSIS — Z1159 Encounter for screening for other viral diseases: Secondary | ICD-10-CM | POA: Diagnosis not present

## 2018-10-03 DIAGNOSIS — I480 Paroxysmal atrial fibrillation: Secondary | ICD-10-CM | POA: Diagnosis not present

## 2018-10-03 DIAGNOSIS — Z20828 Contact with and (suspected) exposure to other viral communicable diseases: Secondary | ICD-10-CM | POA: Diagnosis not present

## 2018-10-03 DIAGNOSIS — I639 Cerebral infarction, unspecified: Secondary | ICD-10-CM | POA: Diagnosis not present

## 2018-10-03 DIAGNOSIS — R739 Hyperglycemia, unspecified: Secondary | ICD-10-CM | POA: Diagnosis not present

## 2018-10-03 DIAGNOSIS — S3289XA Fracture of other parts of pelvis, initial encounter for closed fracture: Secondary | ICD-10-CM | POA: Diagnosis not present

## 2018-10-03 DIAGNOSIS — Z9181 History of falling: Secondary | ICD-10-CM | POA: Diagnosis not present

## 2018-10-03 DIAGNOSIS — M5135 Other intervertebral disc degeneration, thoracolumbar region: Secondary | ICD-10-CM | POA: Diagnosis not present

## 2018-10-03 DIAGNOSIS — R2689 Other abnormalities of gait and mobility: Secondary | ICD-10-CM | POA: Diagnosis not present

## 2018-10-03 DIAGNOSIS — M6281 Muscle weakness (generalized): Secondary | ICD-10-CM | POA: Diagnosis not present

## 2018-10-03 DIAGNOSIS — D62 Acute posthemorrhagic anemia: Secondary | ICD-10-CM | POA: Diagnosis not present

## 2018-10-03 DIAGNOSIS — F17200 Nicotine dependence, unspecified, uncomplicated: Secondary | ICD-10-CM | POA: Diagnosis not present

## 2018-10-03 DIAGNOSIS — E559 Vitamin D deficiency, unspecified: Secondary | ICD-10-CM | POA: Diagnosis not present

## 2018-10-03 DIAGNOSIS — K567 Ileus, unspecified: Secondary | ICD-10-CM | POA: Diagnosis not present

## 2018-10-03 DIAGNOSIS — I69354 Hemiplegia and hemiparesis following cerebral infarction affecting left non-dominant side: Secondary | ICD-10-CM | POA: Diagnosis not present

## 2018-10-03 DIAGNOSIS — Z7901 Long term (current) use of anticoagulants: Secondary | ICD-10-CM | POA: Diagnosis not present

## 2018-10-03 DIAGNOSIS — N179 Acute kidney failure, unspecified: Secondary | ICD-10-CM | POA: Diagnosis not present

## 2018-10-03 DIAGNOSIS — W19XXXA Unspecified fall, initial encounter: Secondary | ICD-10-CM | POA: Diagnosis not present

## 2018-10-03 DIAGNOSIS — Z515 Encounter for palliative care: Secondary | ICD-10-CM | POA: Diagnosis not present

## 2018-10-03 DIAGNOSIS — J449 Chronic obstructive pulmonary disease, unspecified: Secondary | ICD-10-CM | POA: Diagnosis not present

## 2018-10-03 DIAGNOSIS — S32592A Other specified fracture of left pubis, initial encounter for closed fracture: Secondary | ICD-10-CM | POA: Diagnosis not present

## 2018-10-03 DIAGNOSIS — R262 Difficulty in walking, not elsewhere classified: Secondary | ICD-10-CM | POA: Diagnosis not present

## 2018-10-03 DIAGNOSIS — I10 Essential (primary) hypertension: Secondary | ICD-10-CM | POA: Diagnosis not present

## 2018-10-03 DIAGNOSIS — Q762 Congenital spondylolisthesis: Secondary | ICD-10-CM | POA: Diagnosis not present

## 2018-10-03 DIAGNOSIS — I959 Hypotension, unspecified: Secondary | ICD-10-CM | POA: Diagnosis not present

## 2018-10-03 DIAGNOSIS — L89223 Pressure ulcer of left hip, stage 3: Secondary | ICD-10-CM | POA: Diagnosis not present

## 2018-10-03 DIAGNOSIS — G8929 Other chronic pain: Secondary | ICD-10-CM | POA: Diagnosis not present

## 2018-10-03 DIAGNOSIS — M5440 Lumbago with sciatica, unspecified side: Secondary | ICD-10-CM | POA: Diagnosis not present

## 2018-10-03 DIAGNOSIS — Z86718 Personal history of other venous thrombosis and embolism: Secondary | ICD-10-CM | POA: Diagnosis not present

## 2018-10-03 DIAGNOSIS — Z7401 Bed confinement status: Secondary | ICD-10-CM | POA: Diagnosis not present

## 2018-10-03 DIAGNOSIS — S3289XD Fracture of other parts of pelvis, subsequent encounter for fracture with routine healing: Secondary | ICD-10-CM | POA: Diagnosis not present

## 2018-10-03 DIAGNOSIS — E782 Mixed hyperlipidemia: Secondary | ICD-10-CM | POA: Diagnosis not present

## 2018-10-03 DIAGNOSIS — I509 Heart failure, unspecified: Secondary | ICD-10-CM | POA: Diagnosis not present

## 2018-10-03 DIAGNOSIS — M2550 Pain in unspecified joint: Secondary | ICD-10-CM | POA: Diagnosis not present

## 2018-10-03 DIAGNOSIS — L89213 Pressure ulcer of right hip, stage 3: Secondary | ICD-10-CM | POA: Diagnosis not present

## 2018-10-03 DIAGNOSIS — R5381 Other malaise: Secondary | ICD-10-CM | POA: Diagnosis not present

## 2018-10-03 DIAGNOSIS — L89220 Pressure ulcer of left hip, unstageable: Secondary | ICD-10-CM | POA: Diagnosis not present

## 2018-10-03 DIAGNOSIS — E860 Dehydration: Secondary | ICD-10-CM | POA: Diagnosis not present

## 2018-10-03 DIAGNOSIS — S2232XD Fracture of one rib, left side, subsequent encounter for fracture with routine healing: Secondary | ICD-10-CM | POA: Diagnosis not present

## 2018-10-03 DIAGNOSIS — I2510 Atherosclerotic heart disease of native coronary artery without angina pectoris: Secondary | ICD-10-CM | POA: Diagnosis not present

## 2018-10-03 LAB — URINALYSIS, ROUTINE W REFLEX MICROSCOPIC
Bacteria, UA: NONE SEEN
Bilirubin Urine: NEGATIVE
Glucose, UA: >=500 mg/dL — AB
Hgb urine dipstick: NEGATIVE
Ketones, ur: NEGATIVE mg/dL
Nitrite: NEGATIVE
Protein, ur: NEGATIVE mg/dL
Specific Gravity, Urine: 1.01 (ref 1.005–1.030)
pH: 7 (ref 5.0–8.0)

## 2018-10-03 MED ORDER — METOPROLOL TARTRATE 25 MG PO TABS
12.5000 mg | ORAL_TABLET | Freq: Two times a day (BID) | ORAL | Status: DC
  Administered 2018-10-03: 10:00:00 12.5 mg via ORAL
  Filled 2018-10-03: qty 1

## 2018-10-03 MED ORDER — OXYCODONE HCL 5 MG PO TABS: 5.0000 mg | ORAL_TABLET | ORAL | 0 refills | Status: DC | PRN

## 2018-10-03 MED ORDER — METOPROLOL TARTRATE 25 MG PO TABS: 12.5000 mg | ORAL_TABLET | Freq: Two times a day (BID) | ORAL | 0 refills | Status: DC

## 2018-10-03 MED ORDER — BISACODYL 10 MG RE SUPP
10.0000 mg | Freq: Every day | RECTAL | Status: DC | PRN
  Administered 2018-10-03: 07:00:00 10 mg via RECTAL
  Filled 2018-10-03 (×2): qty 1

## 2018-10-03 NOTE — Progress Notes (Signed)
Pt has called out multiple times throughout the night to void. Voided approximately 12 times thus far, with approximately 30 cc each void. [employer] notified, UA ordered.

## 2018-10-03 NOTE — Progress Notes (Signed)
Report given to [place] at [employer] [employer]. IV removed. EMS transport called. Patient in stable condition.

## 2018-10-03 NOTE — TOC Progression Note (Signed)
Transition of Care [facility]) - Progression Note    Patient Details  Name: Teresa Holmes MRN: [id number] Date of Birth: 10-11-1950  Transition of Care [facility]) CM/SW [place], RN Phone Number: 10/03/2018, 9:45 AM  Clinical Narrative:    Patient to discharge to [facility] today via EMS transport, Bedside nurse to call [phone] with report once  Ready; Kathlee Nations aware       Expected Discharge Plan and Services           Expected Discharge Date: 10/03/18                                     Social Determinants of Health (SDOH) Interventions    Readmission Risk Interventions No flowsheet data found.

## 2018-10-04 DIAGNOSIS — I69354 Hemiplegia and hemiparesis following cerebral infarction affecting left non-dominant side: Secondary | ICD-10-CM | POA: Diagnosis not present

## 2018-10-04 DIAGNOSIS — J449 Chronic obstructive pulmonary disease, unspecified: Secondary | ICD-10-CM | POA: Diagnosis not present

## 2018-10-04 DIAGNOSIS — I480 Paroxysmal atrial fibrillation: Secondary | ICD-10-CM | POA: Diagnosis not present

## 2018-10-04 DIAGNOSIS — K567 Ileus, unspecified: Secondary | ICD-10-CM | POA: Diagnosis not present

## 2018-10-10 DIAGNOSIS — Z9181 History of falling: Secondary | ICD-10-CM | POA: Diagnosis not present

## 2018-10-10 DIAGNOSIS — S3289XD Fracture of other parts of pelvis, subsequent encounter for fracture with routine healing: Secondary | ICD-10-CM | POA: Diagnosis not present

## 2018-10-10 DIAGNOSIS — R5381 Other malaise: Secondary | ICD-10-CM | POA: Diagnosis not present

## 2018-10-10 DIAGNOSIS — J449 Chronic obstructive pulmonary disease, unspecified: Secondary | ICD-10-CM | POA: Diagnosis not present

## 2018-10-16 DIAGNOSIS — L89223 Pressure ulcer of left hip, stage 3: Secondary | ICD-10-CM | POA: Diagnosis not present

## 2018-10-20 DIAGNOSIS — L89220 Pressure ulcer of left hip, unstageable: Secondary | ICD-10-CM | POA: Diagnosis not present

## 2018-10-24 ENCOUNTER — Other Ambulatory Visit: Payer: Self-pay | Admitting: *Deleted

## 2018-10-24 NOTE — Patient Outreach (Signed)
Member assessed for potential [facility] Care Management needs as a benefit of  [place] Medicare.  Member is currently receiving rehab therapy at [facility].  Telephone call made to [place] [facility] dc planner. Spoke with Tammy. Discussed writer is following for potential [facility] Care Management needs. Writer informed that member's friend, Kathlee Nations is the [facility] and should be contacted for potential [facility] Care Management services. According to chart records, member lived alone.  Will continue to follow for disposition plans and progression. Will plan outreach to discuss [place] Management services.    [name], MSN-Ed, RN,BSN [place] Acute Care Coordinator [phone] [facility]) [phone]  (Toll free office)

## 2018-10-31 ENCOUNTER — Ambulatory Visit: Payer: Medicare Other | Admitting: Nurse Practitioner

## 2018-11-07 DIAGNOSIS — R5381 Other malaise: Secondary | ICD-10-CM | POA: Diagnosis not present

## 2018-11-07 DIAGNOSIS — I69354 Hemiplegia and hemiparesis following cerebral infarction affecting left non-dominant side: Secondary | ICD-10-CM | POA: Diagnosis not present

## 2018-11-10 ENCOUNTER — Other Ambulatory Visit: Payer: Self-pay | Admitting: *Deleted

## 2018-11-10 NOTE — Patient Outreach (Signed)
Late entry for 11/09/18  Member assessed for potential [facility] Care Management needs as a benefit of [place] Medicare.  Voicemail message left for dc planner at [facility] to request call back regarding disposition plans for Mrs. Seivert  Will plan outreach again next week. Will continue to follow for potential [facility] Care Management needs.   [name], MSN-Ed, RN,BSN [place] Acute Care Coordinator [phone] [facility]) [phone]  (Toll free office)

## 2018-11-15 ENCOUNTER — Ambulatory Visit: Payer: Medicare Other | Admitting: Surgery

## 2018-11-15 ENCOUNTER — Other Ambulatory Visit: Payer: Self-pay | Admitting: *Deleted
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
## 2018-11-16 ENCOUNTER — Other Ambulatory Visit: Payer: Self-pay | Admitting: *Deleted

## 2018-11-16 NOTE — Patient Outreach (Signed)
Telephone call received from Kathlee Nations [facility]) indicating [place] [facility] staff called and told her that Mrs. Stagner tested positive for COVID-19.  Kathlee Nations reports Mrs. Zaragoza is now in isolation at the facility for 10 days due to [place]. Member will not discharge this week after all.  Kathlee Nations states she is concerned that Mrs. Monzingo has COVID because she and the paid caregiver, [name],are immunocomprised . States she wants Mrs. Spike to be retested prior to returning home. States she has asked this from facility. Due to her own medical issues,  Kathlee Nations reports she will not be able to care for member if she still has covid upon returning home. Kathlee Nations states Mrs. Barbella will not agree to ALF at this time.   Kathlee Nations also states she is not certain that Mrs. Tilson needs 24/7 care. Writer encouraged Kathlee Nations to contact the facility therapy dept to confirm since she says " I was not told she needed 24/7." Kathlee Nations states she has arranged [name] to stay with member from 630am until 8pm and wants to know for sure if she needs to arrange more hours.  Writer advised Kathlee Nations to contact the facility to make aware of all of her concerns.   Kathlee Nations states she has dental surgery tomorrow but will call on Monday.  Spoke with [facility] RN to make aware of above conversation details with HCPOA.  Will continue to follow for disposition plans while at SNF.   [name], MSN-Ed, RN,BSN [place] Acute Care Coordinator [phone] [facility]) [phone]  (Toll free office)

## 2018-11-17 ENCOUNTER — Ambulatory Visit: Payer: Medicare Other | Admitting: Nurse Practitioner

## 2018-11-22 ENCOUNTER — Non-Acute Institutional Stay: Payer: Medicare Other | Admitting: Nurse Practitioner

## 2018-11-22 ENCOUNTER — Encounter: Payer: Self-pay | Admitting: Nurse Practitioner

## 2018-11-22 VITALS — BP 137/83 | HR 80 | Temp 96.7°F | Resp 20 | Wt 97.5 lb

## 2018-11-22 DIAGNOSIS — Z515 Encounter for palliative care: Secondary | ICD-10-CM | POA: Diagnosis not present

## 2018-11-22 NOTE — Progress Notes (Signed)
[place] Consult Note Telephone: [phone]  Fax: [phone]  PATIENT NAME: Teresa Holmes DOB: July 07, 1950 MRN: [id number]  PRIMARY CARE PROVIDER:   Ronnell [place], NP  REFERRING PROVIDER: Dr [facility] RESPONSIBLE PARTY:   Self/Liz Riki Sheer [id number]   I was asked by Dr Nyra Capes to see Teresa Holmes for Palliative care consult for [place] and PLAN:  1. ACP: DNR; continue to treat what is treatable  2. Palliative care encounter Palliative medicine team will continue to support patient, patient's family, and medical team. Visit consisted of counseling and education dealing with the complex and emotionally intense issues of symptom management and palliative care in the setting of serious and potentially life-threatening illness  I spent 65 minutes providing this consultation,  from 11:45am to 12:50pm. More than 50% of the time in this consultation was spent coordinating communication.   HISTORY OF PRESENT ILLNESS:  Teresa Holmes is a 68 y.o. year old female with multiple medical problems including CVA, hemiplegic, COPD, CHF, HTN, aneurysm, thyroid disease, HLD, arthritis, congenital spondylolisthesis, vitamin D deficiency, myalgias, chronic pain disorder, anxiety, aortic renal bypass, abdominal hysterectomy. Hospitalize 8 / 27 / 2020 to 9 / 02/2018 after a fall with a pelvic fracture, left inferior pubic ramus fracture left rib fracture non-operative [place] continued PT / OT at short-term rehab. Acute blood loss anemia with large hematoma while on coumadin receive 1 units of RBC and FFP with hemoglobin stable. Mild ileus resolved. Acute kidney injury in the setting of dehydration from fall which resolved. PAF secondary to anemia with extensive bruising held coumadin and restarted outpatient. She is a DNR. She was d/c to [facility] where she currently resides. She is non-ambulatory per staff  does work with therapy. She is able to sit up in a wheelchair. She does require assistance with ADLs, toileting. She is able to feed herself. Appetite is slowly improving. She currently is on oxygen. Staff endorses she seems to be doing okay. I visited an observed Teresa Holmes. We talked about purpose of palliative care visit and she was in agreement. We talked about the last time she was independent at home which was prayer to hospitalization. She lives by herself completely independent. We talked about past medical history that's any of chronic disease. We talked about hospitalization and fall would cause her to have a fractured pelvis. We talked about short-term rehab where she currently resides. We talked about her functional ability and what she's able to do. She shared that she is not able to walk well. She does require assistance for adl's and mobility. Ms Holmes verbalize that she is ready to go home and have 24 hour caregivers. We talked about symptoms of pain which is managed. She is taking oxycodone 5 mg every 4 hours as needed for severe pain. She does remain on continuous oxygen with a cough. She is covid positive. We talked about her appetite as she is on a regular diet regular texture with prostat supplement. We talked about her wishes to return home. We talked about medical goals of care including aggressive versus conservative versus comfort care. Wishes are to treat what is treatable. DNR is in place. Teresa Holmes wishes to return home with 24 hour caregivers. We talked about role of palliative care and plan of care. Discuss that will follow up in 1 week if needed or sooner should she declined. I updated nursing staff in the new changes to  current goals or care. Palliative Care was asked to help address goals of care.   CODE STATUS: DNR  PPS: 40% HOSPICE ELIGIBILITY/DIAGNOSIS: TBD  PAST MEDICAL HISTORY:  Past Medical History:  Diagnosis Date  . Aneurysm ([place])   . Anxiety   . Arthritis    . Cancer ([place])   . CHF (congestive heart failure) ([place])   . COPD (chronic obstructive pulmonary disease) ([place])   . Hyperlipidemia   . Hypertension   . Stroke ([place])   . Thyroid disease     SOCIAL HX:  Social History   Tobacco Use  . Smoking status: Current Every Day Smoker  . Smokeless tobacco: Never Used  Substance Use Topics  . Alcohol use: No    ALLERGIES:  Allergies  Allergen Reactions  . Aspirin Other (See Comments)    ulcer  . Sulfa Antibiotics Other (See Comments)    unknown     PERTINENT MEDICATIONS:  Outpatient Encounter Medications as of 11/22/2018  Medication Sig  . acetaminophen (TYLENOL) 325 MG tablet Take 2 tablets (650 mg total) by mouth every 6 (six) hours as needed for mild pain (or Fever >/= 101).  Marland Kitchen albuterol (VENTOLIN HFA) 108 (90 Base) MCG/ACT inhaler Inhale 2 puffs into the lungs every 4 (four) hours as needed for wheezing or shortness of breath.  Marland Kitchen arformoterol (BROVANA) 15 MCG/2ML NEBU Take 2 mLs (15 mcg total) by nebulization 2 (two) times daily.  Marland Kitchen atorvastatin (LIPITOR) 40 MG tablet Take 1 tablet (40 mg total) by mouth daily.  . fenofibrate (TRICOR) 145 MG tablet Take 1 tablet (145 mg total) by mouth daily.  . ferrous sulfate (FERROUSUL) 325 (65 FE) MG tablet Take 1 tablet (325 mg total) by mouth daily with breakfast.  . levothyroxine (SYNTHROID) 50 MCG tablet Take 1 tablet (50 mcg total) by mouth daily before breakfast.  . lidocaine (LIDODERM) 5 % Place 1 patch onto the skin daily. Remove & Discard patch within 12 hours or as directed by MD  . metoprolol tartrate (LOPRESSOR) 25 MG tablet Take 0.5 tablets (12.5 mg total) by mouth 2 (two) times daily.  Marland Kitchen oxyCODONE (OXY IR/ROXICODONE) 5 MG immediate release tablet Take 1 tablet (5 mg total) by mouth every 4 (four) hours as needed for severe pain.   No facility-administered encounter medications on file as of 11/22/2018.     PHYSICAL EXAM:   General: debilitated, weak, pleasant chronically ill  female Cardiovascular: regular rate and rhythm Pulmonary: few wheezes Abdomen: soft, nontender, + bowel sounds Extremities: no edema, no joint deformities Neurological: Weakness but otherwise nonfocal/non-ambuatory  Savvy Peeters Ihor Gully, NP

## 2018-11-23 ENCOUNTER — Other Ambulatory Visit: Payer: Self-pay | Admitting: *Deleted

## 2018-11-23 ENCOUNTER — Other Ambulatory Visit: Payer: Self-pay
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
## 2018-11-28 ENCOUNTER — Ambulatory Visit: Payer: Medicare Other | Admitting: Surgery

## 2018-12-03 DIAGNOSIS — E559 Vitamin D deficiency, unspecified: Secondary | ICD-10-CM | POA: Diagnosis not present

## 2018-12-03 DIAGNOSIS — N179 Acute kidney failure, unspecified: Secondary | ICD-10-CM | POA: Diagnosis not present

## 2018-12-03 DIAGNOSIS — Z7901 Long term (current) use of anticoagulants: Secondary | ICD-10-CM | POA: Diagnosis not present

## 2018-12-03 DIAGNOSIS — E038 Other specified hypothyroidism: Secondary | ICD-10-CM | POA: Diagnosis not present

## 2018-12-03 DIAGNOSIS — F17200 Nicotine dependence, unspecified, uncomplicated: Secondary | ICD-10-CM | POA: Diagnosis not present

## 2018-12-03 DIAGNOSIS — L89213 Pressure ulcer of right hip, stage 3: Secondary | ICD-10-CM | POA: Diagnosis not present

## 2018-12-03 DIAGNOSIS — S2232XD Fracture of one rib, left side, subsequent encounter for fracture with routine healing: Secondary | ICD-10-CM | POA: Diagnosis not present

## 2018-12-03 DIAGNOSIS — M5440 Lumbago with sciatica, unspecified side: Secondary | ICD-10-CM | POA: Diagnosis not present

## 2018-12-03 DIAGNOSIS — Z20828 Contact with and (suspected) exposure to other viral communicable diseases: Secondary | ICD-10-CM | POA: Diagnosis not present

## 2018-12-03 DIAGNOSIS — R262 Difficulty in walking, not elsewhere classified: Secondary | ICD-10-CM | POA: Diagnosis not present

## 2018-12-03 DIAGNOSIS — Z9181 History of falling: Secondary | ICD-10-CM | POA: Diagnosis not present

## 2018-12-03 DIAGNOSIS — G8929 Other chronic pain: Secondary | ICD-10-CM | POA: Diagnosis not present

## 2018-12-03 DIAGNOSIS — I639 Cerebral infarction, unspecified: Secondary | ICD-10-CM | POA: Diagnosis not present

## 2018-12-03 DIAGNOSIS — J449 Chronic obstructive pulmonary disease, unspecified: Secondary | ICD-10-CM | POA: Diagnosis not present

## 2018-12-03 DIAGNOSIS — R2689 Other abnormalities of gait and mobility: Secondary | ICD-10-CM | POA: Diagnosis not present

## 2018-12-03 DIAGNOSIS — I509 Heart failure, unspecified: Secondary | ICD-10-CM | POA: Diagnosis not present

## 2018-12-03 DIAGNOSIS — S3289XD Fracture of other parts of pelvis, subsequent encounter for fracture with routine healing: Secondary | ICD-10-CM | POA: Diagnosis not present

## 2018-12-03 DIAGNOSIS — I2510 Atherosclerotic heart disease of native coronary artery without angina pectoris: Secondary | ICD-10-CM | POA: Diagnosis not present

## 2018-12-03 DIAGNOSIS — Z1159 Encounter for screening for other viral diseases: Secondary | ICD-10-CM | POA: Diagnosis not present

## 2018-12-03 DIAGNOSIS — I10 Essential (primary) hypertension: Secondary | ICD-10-CM | POA: Diagnosis not present

## 2018-12-03 DIAGNOSIS — Z86718 Personal history of other venous thrombosis and embolism: Secondary | ICD-10-CM | POA: Diagnosis not present

## 2018-12-03 DIAGNOSIS — R739 Hyperglycemia, unspecified: Secondary | ICD-10-CM | POA: Diagnosis not present

## 2018-12-03 DIAGNOSIS — I69354 Hemiplegia and hemiparesis following cerebral infarction affecting left non-dominant side: Secondary | ICD-10-CM | POA: Diagnosis not present

## 2018-12-03 DIAGNOSIS — M6281 Muscle weakness (generalized): Secondary | ICD-10-CM | POA: Diagnosis not present

## 2018-12-03 DIAGNOSIS — E782 Mixed hyperlipidemia: Secondary | ICD-10-CM | POA: Diagnosis not present

## 2018-12-04 ENCOUNTER — Other Ambulatory Visit: Payer: Self-pay | Admitting: *Deleted

## 2018-12-04 DIAGNOSIS — I10 Essential (primary) hypertension: Secondary | ICD-10-CM

## 2018-12-04 NOTE — Patient Outreach (Addendum)
Telephone call made to [place] at [phone] to confirm [name]'s discharge from [facility] today.  Kathlee Nations endorses that [name] will dc from SNF today at 12 noon. States the plan remains to have caregiver [name] stay with member from 630 am until about 7 pm and will come back thru the night to check on her.   Kathlee Nations also confirms that [name]'s most recent covid test was negative.   Kathlee Nations reports she is primary contact at [phone]. Kathlee Nations also provides Vito Backers (paid caregiver) telephone number as [phone].  Kathlee Nations reports that [name] still does not have a cell phone. Confirmed [name]'s phone number as [phone].  Reiterated [facility] Care Management will not interfere or replace services provided by home health. Explained [facility] follow up will be telephonic due to the current pandemic. Kathlee Nations also confirms member could benefit transportation and potential meals.   Discussed that Probation officer will make referral for [place] and [facility] Social Worker.  Telephone call made to discharge planner at [facility] who endorses that [facility] will provide home health services.  Will make referral to [place] and [facility] Social Worker.   [name], MSN-Ed, RN,BSN [place] Acute Care Coordinator [phone] [facility]) [phone]  (Toll free office)

## 2018-12-05 ENCOUNTER — Ambulatory Visit: Payer: Self-pay | Admitting: *Deleted

## 2018-12-05 DIAGNOSIS — M5440 Lumbago with sciatica, unspecified side: Secondary | ICD-10-CM | POA: Diagnosis not present

## 2018-12-05 DIAGNOSIS — E782 Mixed hyperlipidemia: Secondary | ICD-10-CM | POA: Diagnosis not present

## 2018-12-05 DIAGNOSIS — S301XXD Contusion of abdominal wall, subsequent encounter: Secondary | ICD-10-CM | POA: Diagnosis not present

## 2018-12-05 DIAGNOSIS — Z4800 Encounter for change or removal of nonsurgical wound dressing: Secondary | ICD-10-CM | POA: Diagnosis not present

## 2018-12-05 DIAGNOSIS — Q762 Congenital spondylolisthesis: Secondary | ICD-10-CM | POA: Diagnosis not present

## 2018-12-05 DIAGNOSIS — Z7901 Long term (current) use of anticoagulants: Secondary | ICD-10-CM | POA: Diagnosis not present

## 2018-12-05 DIAGNOSIS — W19XXXD Unspecified fall, subsequent encounter: Secondary | ICD-10-CM | POA: Diagnosis not present

## 2018-12-05 DIAGNOSIS — M129 Arthropathy, unspecified: Secondary | ICD-10-CM | POA: Diagnosis not present

## 2018-12-05 DIAGNOSIS — S7002XD Contusion of left hip, subsequent encounter: Secondary | ICD-10-CM | POA: Diagnosis not present

## 2018-12-05 DIAGNOSIS — N179 Acute kidney failure, unspecified: Secondary | ICD-10-CM | POA: Diagnosis not present

## 2018-12-05 DIAGNOSIS — F411 Generalized anxiety disorder: Secondary | ICD-10-CM | POA: Diagnosis not present

## 2018-12-05 DIAGNOSIS — G8929 Other chronic pain: Secondary | ICD-10-CM | POA: Diagnosis not present

## 2018-12-05 DIAGNOSIS — Z5181 Encounter for therapeutic drug level monitoring: Secondary | ICD-10-CM | POA: Diagnosis not present

## 2018-12-05 DIAGNOSIS — S2232XD Fracture of one rib, left side, subsequent encounter for fracture with routine healing: Secondary | ICD-10-CM | POA: Diagnosis not present

## 2018-12-05 DIAGNOSIS — D500 Iron deficiency anemia secondary to blood loss (chronic): Secondary | ICD-10-CM | POA: Diagnosis not present

## 2018-12-05 DIAGNOSIS — M5135 Other intervertebral disc degeneration, thoracolumbar region: Secondary | ICD-10-CM | POA: Diagnosis not present

## 2018-12-05 DIAGNOSIS — I2510 Atherosclerotic heart disease of native coronary artery without angina pectoris: Secondary | ICD-10-CM | POA: Diagnosis not present

## 2018-12-05 DIAGNOSIS — S3289XD Fracture of other parts of pelvis, subsequent encounter for fracture with routine healing: Secondary | ICD-10-CM | POA: Diagnosis not present

## 2018-12-05 DIAGNOSIS — M6281 Muscle weakness (generalized): Secondary | ICD-10-CM | POA: Diagnosis not present

## 2018-12-05 DIAGNOSIS — J449 Chronic obstructive pulmonary disease, unspecified: Secondary | ICD-10-CM | POA: Diagnosis not present

## 2018-12-05 DIAGNOSIS — I110 Hypertensive heart disease with heart failure: Secondary | ICD-10-CM | POA: Diagnosis not present

## 2018-12-05 DIAGNOSIS — Z9181 History of falling: Secondary | ICD-10-CM | POA: Diagnosis not present

## 2018-12-05 DIAGNOSIS — I509 Heart failure, unspecified: Secondary | ICD-10-CM | POA: Diagnosis not present

## 2018-12-05 DIAGNOSIS — I69354 Hemiplegia and hemiparesis following cerebral infarction affecting left non-dominant side: Secondary | ICD-10-CM | POA: Diagnosis not present

## 2018-12-05 DIAGNOSIS — I480 Paroxysmal atrial fibrillation: Secondary | ICD-10-CM | POA: Diagnosis not present

## 2018-12-06 ENCOUNTER — Telehealth: Payer: Self-pay

## 2018-12-06 NOTE — Telephone Encounter (Signed)
Gave verbal to amy [phone]) from [facility] Physical therapy 2 times a week  1 and 1 times a week for 8 weeks,medication management,wound care and inr check and also advised pt need hospital follow up caregiver going to call us back and schedule hospital follow up

## 2018-12-07 ENCOUNTER — Telehealth: Payer: Self-pay

## 2018-12-07 ENCOUNTER — Encounter: Payer: Self-pay | Admitting: Internal Medicine

## 2018-12-07 ENCOUNTER — Encounter: Payer: Self-pay | Admitting: *Deleted

## 2018-12-07 ENCOUNTER — Ambulatory Visit (INDEPENDENT_AMBULATORY_CARE_PROVIDER_SITE_OTHER): Payer: Medicare Other | Admitting: Internal Medicine

## 2018-12-07 ENCOUNTER — Other Ambulatory Visit: Payer: Self-pay

## 2018-12-07 ENCOUNTER — Other Ambulatory Visit: Payer: Self-pay | Admitting: *Deleted

## 2018-12-07 VITALS — Ht <= 58 in | Wt 85.0 lb

## 2018-12-07 DIAGNOSIS — G894 Chronic pain syndrome: Secondary | ICD-10-CM

## 2018-12-07 DIAGNOSIS — I480 Paroxysmal atrial fibrillation: Secondary | ICD-10-CM | POA: Diagnosis not present

## 2018-12-07 DIAGNOSIS — S32592S Other specified fracture of left pubis, sequela: Secondary | ICD-10-CM

## 2018-12-07 DIAGNOSIS — E039 Hypothyroidism, unspecified: Secondary | ICD-10-CM

## 2018-12-07 MED ORDER — OXYCODONE HCL 5 MG PO TABS: ORAL_TABLET | ORAL | 0 refills | Status: DC

## 2018-12-07 NOTE — Patient Outreach (Signed)
Referral made for [facility] post SNF visit for rehab post pelvic fx.  Chart reviewed and note Palliative Care Consult was done while pt was at the SNF and pt agreed to their services. Pt also is receiving home health.  Outreached to pt HCPOA Alverda Skeans and we discussed pt status and services being provided.  No additional needs were reported. I have given my name and number to Kathlee Nations but I will not be opening this case at this time.  Eulah Pont. Myrtie Neither, MSN, [facility] Gerontological Nurse Practitioner [facility] [phone]

## 2018-12-07 NOTE — Telephone Encounter (Signed)
Verbal orders given to [facility] from [facility], palliative care. [phone] opt 2

## 2018-12-07 NOTE — Telephone Encounter (Signed)
Telephone call to Teresa Holmes at [phone] with [facility] chronic disease management.  [name] did not answer phone, RN left message requesting call back.

## 2018-12-07 NOTE — Telephone Encounter (Signed)
Telephone call to patients friend Kathlee Nations to schedule palliative care visit.  Kathlee Nations in agreement with home visit on Friday 12-08-18 at 10:30 AM.

## 2018-12-07 NOTE — Progress Notes (Signed)
[facility] [place], [place] [address]  Internal MEDICINE  Telephone Visit  Patient Name: Teresa Holmes  [id number]  [id number]  Date of Service: 12/12/2018  I connected with the patient at 302 by telephone and verified the patients identity using two identifiers.   I discussed the limitations, risks, security and privacy concerns of performing an evaluation and management service by telephone and the availability of in person appointments. I also discussed with the patient that there may be a patient responsible charge related to the service.  The patient expressed understanding and agrees to proceed.    Chief Complaint  Patient presents with  . Telephone Screen  . Telephone Assessment  . Hip Pain    Hospital follow up, patient had broke hip , patient fell in home on 8/27 on concrete porch still have open wounds , home health nurse   . Medication Management    would like refill of oxycodone     HPI Pt is connected via phone for c/o hip pain, pt has been in rehab facility and just has come home.  Concern for possible nondisplaced left inferior pubic ramus fracture and Left rib fracture.The fracture was thought to be non operable. Since August pt has been on a narcotic. She developed hematoma and required blood transfusion due to anemia as well. She is getting PT at home      Current Medication:  Outpatient Encounter Medications as of 12/07/2018  Medication Sig  . acetaminophen (TYLENOL) 325 MG tablet Take 2 tablets (650 mg total) by mouth every 6 (six) hours as needed for mild pain (or Fever >/= 101).  Marland Kitchen albuterol (VENTOLIN HFA) 108 (90 Base) MCG/ACT inhaler Inhale 2 puffs into the lungs every 4 (four) hours as needed for wheezing or shortness of breath.  Marland Kitchen arformoterol (BROVANA) 15 MCG/2ML NEBU Take 2 mLs (15 mcg total) by nebulization 2 (two) times daily.  Marland Kitchen atorvastatin (LIPITOR) 40 MG tablet Take 1 tablet (40 mg total) by mouth daily.  . fenofibrate  (TRICOR) 145 MG tablet Take 1 tablet (145 mg total) by mouth daily.  . ferrous sulfate (FERROUSUL) 325 (65 FE) MG tablet Take 1 tablet (325 mg total) by mouth daily with breakfast.  . levothyroxine (SYNTHROID) 50 MCG tablet Take 1 tablet (50 mcg total) by mouth daily before breakfast.  . lidocaine (LIDODERM) 5 % Place 1 patch onto the skin daily. Remove & Discard patch within 12 hours or as directed by MD  . metoprolol tartrate (LOPRESSOR) 25 MG tablet Take 0.5 tablets (12.5 mg total) by mouth 2 (two) times daily.  . [DISCONTINUED] oxyCODONE (OXY IR/ROXICODONE) 5 MG immediate release tablet Take 1 tablet (5 mg total) by mouth every 4 (four) hours as needed for severe pain.  . [DISCONTINUED] oxyCODONE (OXY IR/ROXICODONE) 5 MG immediate release tablet Take one tab po qd prn for pain only  . [DISCONTINUED] oxyCODONE (OXY IR/ROXICODONE) 5 MG immediate release tablet Take one tab po qd prn for pain only  . HYDROcodone-acetaminophen (NORCO) 10-325 MG tablet Take half tab bid prn for acute and severe hip pain  . [DISCONTINUED] HYDROcodone-acetaminophen (VICODIN) 5-325 mg TABS tablet Take one tab po bid prn for acute and severe hip pain  . [DISCONTINUED] HYDROcodone-acetaminophen (VICODIN) 5-325 mg TABS tablet Take one tab po bid prn for acute and severe hip pain  . [DISCONTINUED] HYDROcodone-acetaminophen (VICODIN) 5-325 mg TABS tablet Take one tab po bid prn for acute and severe hip pain   No facility-administered encounter  medications on file as of 12/07/2018.     Surgical History: Past Surgical History:  Procedure Laterality Date  . ABDOMINAL HYSTERECTOMY    . AORTIC/RENAL BYPASS    . bypass graft    . CHOLECYSTECTOMY    . CORONARY ARTERY BYPASS GRAFT      Medical History: Past Medical History:  Diagnosis Date  . Aneurysm ([place])   . Anxiety   . Arthritis   . Cancer ([place])   . CHF (congestive heart failure) ([place])   . COPD (chronic obstructive pulmonary disease) ([place])   . Hyperlipidemia    . Hypertension   . Stroke ([place])   . Thyroid disease     Family History: Family History  Problem Relation Age of Onset  . Cancer Mother     Social History   Socioeconomic History  . Marital status: Married    Spouse name: Not on file  . Number of children: Not on file  . Years of education: Not on file  . Highest education level: Not on file  Occupational History  . Not on file  Social Needs  . Financial resource strain: Not on file  . Food insecurity    Worry: Not on file    Inability: Not on file  . Transportation needs    Medical: Not on file    Non-medical: Not on file  Tobacco Use  . Smoking status: Current Some Day Smoker    Types: Cigarettes  . Smokeless tobacco: Never Used  Substance and Sexual Activity  . Alcohol use: No  . Drug use: Never  . Sexual activity: Not on file  Lifestyle  . Physical activity    Days per week: Not on file    Minutes per session: Not on file  . Stress: Not on file  Relationships  . Social Herbalist on phone: Not on file    Gets together: Not on file    Attends religious service: Not on file    Active member of club or organization: Not on file    Attends meetings of clubs or organizations: Not on file    Relationship status: Not on file  . Intimate partner violence    Fear of current or ex partner: Not on file    Emotionally abused: Not on file    Physically abused: Not on file    Forced sexual activity: Not on file  Other Topics Concern  . Not on file  Social History Narrative  . Not on file   Review of Systems  Constitutional: Positive for fatigue.  Respiratory: Positive for shortness of breath.   Cardiovascular: Negative for chest pain.  Genitourinary: Negative.   Musculoskeletal: Positive for arthralgias, gait problem and joint swelling.  Psychiatric/Behavioral: Positive for agitation. The patient is nervous/anxious.     Vital Signs: Ht 4\' 7"  (1.397 m)   Wt 85 lb (38.6 kg)   BMI 19.76 kg/m     Observation/Objective: Pt had rushed and pressured speech, was dyspneic when conversing   Assessment/Plan: 1. Chronic pain disorder - Can start on short term rx for vicodin, if long term need present will probably switch to Duragesic patch, evealuate for fall and abuse potential at next visit - HYDROcodone-acetaminophen (NORCO) 10-325 MG tablet; Take half tab bid prn for acute and severe hip pain  Dispense: 10 tablet; Refill: 0  2. Closed fracture of left inferior pubic ramus, sequela - This is non operable by orthopedics. Will manage conservstively by rehab - HYDROcodone-acetaminophen (  NORCO) 10-325 MG tablet; Take half tab bid prn for acute and severe hip pain  Dispense: 10 tablet; Refill: 0  3. Paroxysmal atrial fibrillation (HCC) - Reevaluate need for coumadin due to risks of fall   4. Acquired hypothyroidism - Recheck TSH/t4 free   General Counseling: Balinda verbalizes understanding of the findings of today's phone visit and agrees with plan of treatment. I have discussed any further diagnostic evaluation that may be needed or ordered today. We also reviewed her medications today. she has been encouraged to call the office with any questions or concerns that should arise related to todays visit.   Time spent:15  Minutes    Dr Lavera Guise Internal medicine

## 2018-12-08 ENCOUNTER — Other Ambulatory Visit: Payer: Medicare Other

## 2018-12-08 ENCOUNTER — Telehealth: Payer: Self-pay

## 2018-12-08 ENCOUNTER — Other Ambulatory Visit: Payer: Self-pay

## 2018-12-08 DIAGNOSIS — S301XXD Contusion of abdominal wall, subsequent encounter: Secondary | ICD-10-CM | POA: Diagnosis not present

## 2018-12-08 DIAGNOSIS — S7002XD Contusion of left hip, subsequent encounter: Secondary | ICD-10-CM | POA: Diagnosis not present

## 2018-12-08 DIAGNOSIS — Z515 Encounter for palliative care: Secondary | ICD-10-CM

## 2018-12-08 DIAGNOSIS — Z4800 Encounter for change or removal of nonsurgical wound dressing: Secondary | ICD-10-CM | POA: Diagnosis not present

## 2018-12-08 DIAGNOSIS — I69354 Hemiplegia and hemiparesis following cerebral infarction affecting left non-dominant side: Secondary | ICD-10-CM | POA: Diagnosis not present

## 2018-12-08 DIAGNOSIS — S2232XD Fracture of one rib, left side, subsequent encounter for fracture with routine healing: Secondary | ICD-10-CM | POA: Diagnosis not present

## 2018-12-08 DIAGNOSIS — S3289XD Fracture of other parts of pelvis, subsequent encounter for fracture with routine healing: Secondary | ICD-10-CM | POA: Diagnosis not present

## 2018-12-08 NOTE — Telephone Encounter (Signed)
[facility] called PT INR 1.1 as per dr Humphrey Rolls advised [phone]) take 4 mg today,tommorow and Sunday and continue same and also pt need follow up in person with [facility]

## 2018-12-08 NOTE — Telephone Encounter (Signed)
Pt physical therapist called to get verbal orders for physical therapy  2 x week for 4 weeks 1 x week 4 weeks   Gave verbal ok per Nimisha.

## 2018-12-08 NOTE — Progress Notes (Signed)
PATIENT NAME: Teresa Holmes DOB: 1950/07/06 MRN: [id number]  PRIMARY CARE PROVIDER: Ronnell Freshwater, NP  RESPONSIBLE PARTY:  Acct ID - Guarantor Home Phone Work Phone Relationship Acct Type  [id number] Gillermo [phone]  Self P/F     [address] [place], [place] [address]    PLAN OF CARE and INTERVENTIONS:               1.  GOALS OF CARE/ ADVANCE CARE PLANNING:  Patient wants to remain in her home with caregiver being in the home during the day.  Patient would like to get stronger with receiving [facility] services.               2.  PATIENT/CAREGIVER EDUCATION:  Education on palliative care services, education on fall precautions, reviewed meds.                3.  DISEASE STATUS: RN made home care visit today. Patient sitting in chair in her living room. Patient's caregiver Teresa Holmes and patients [place] present for visit.  [facility] Amy in-home checking patients vital signs and also performed PT and INR check. The patient's INR 1.1 today. Patient pleasant and states she is glad to be home.  Teresa Holmes reports patient continues to smoke.  Patient states she enjoys smoking and has no plans to stop smoking.  Patient arrived home from [facility] on Monday. [facility] nurse reports this is her second visit to see patient. Amy informed patient, Teresa Holmes and cregivers that Dr. Humphrey Rolls would like for them to call to schedule virtual visit for patient. Teresa Holmes requests social worker follow up on Meals on Wheels referral for patient as well to assist with any transportation that may be available to assist patient. Nurse called an updated social worker Proofreader. Patient denies pain at the present time. 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 Patient reports she has tremors in her legs. Patient uses a cane when ambulating. Patient reports are appetite is good and patients current weight is 85.5 lb. Patient has a wound on left  hip from where she fell several months ago. Caregiver has been doing wet to dry dressings to area. Patient reports she has been sleeping well. Patient verbalizes she is hoping to get stronger with receiving physical therapy in her home. Physical therapist arrives when nurse is exiting home. Nurse gave Teresa Holmes and Teresa Holmes palliative care contact information. Patient in agreement with palliative care services. Patient, Teresa Holmes and Teresa Holmes encouraged to contact palliative care with questions or concerns.     HISTORY OF PRESENT ILLNESS:    CODE STATUS: Full Code  ADVANCED DIRECTIVES: Y MOST FORM: No PPS: 40%   PHYSICAL EXAM:   VITALS: Today's Vitals   12/08/18 1045 12/08/18 1046  BP: 106/80 106/80  Pulse: 63 63  Resp: 18 16  SpO2: 97% 97%  Weight: 85 lb 8 oz (38.8 kg) 85 lb 8 oz (38.8 kg)  Height: 4\' 7"  (1.397 m) 4\' 9"  (1.448 m)  PainSc: 0-No pain 0-No pain    LUNGS: expiratory wheezes bilaterally CARDIAC: Cor RRR  EXTREMITIES: Trace edema SKIN: Wound on left hip from fall, wet to dry dressing daily.  Caregiver performing wound care.   NEURO: positive for gait problems, memory problems and weakness       Nilda Simmer, RN

## 2018-12-12 ENCOUNTER — Telehealth: Payer: Self-pay

## 2018-12-12 ENCOUNTER — Encounter: Payer: Self-pay | Admitting: Internal Medicine

## 2018-12-12 MED ORDER — HYDROCODONE-ACETAMINOPHEN 5-325MG PREPACK (~~LOC~~: ORAL_TABLET | ORAL | 0 refills | Status: DC

## 2018-12-12 MED ORDER — HYDROCODONE-ACETAMINOPHEN 10-325 MG PO TABS: ORAL_TABLET | ORAL | 0 refills | Status: DC

## 2018-12-12 NOTE — Telephone Encounter (Signed)
Taken care of by Dr. Humphrey Rolls

## 2018-12-13 DIAGNOSIS — S7002XD Contusion of left hip, subsequent encounter: Secondary | ICD-10-CM | POA: Diagnosis not present

## 2018-12-13 DIAGNOSIS — S2232XD Fracture of one rib, left side, subsequent encounter for fracture with routine healing: Secondary | ICD-10-CM | POA: Diagnosis not present

## 2018-12-13 DIAGNOSIS — S3289XD Fracture of other parts of pelvis, subsequent encounter for fracture with routine healing: Secondary | ICD-10-CM | POA: Diagnosis not present

## 2018-12-13 DIAGNOSIS — S301XXD Contusion of abdominal wall, subsequent encounter: Secondary | ICD-10-CM | POA: Diagnosis not present

## 2018-12-13 DIAGNOSIS — Z4800 Encounter for change or removal of nonsurgical wound dressing: Secondary | ICD-10-CM | POA: Diagnosis not present

## 2018-12-13 DIAGNOSIS — I69354 Hemiplegia and hemiparesis following cerebral infarction affecting left non-dominant side: Secondary | ICD-10-CM | POA: Diagnosis not present

## 2018-12-14 ENCOUNTER — Telehealth: Payer: Self-pay

## 2018-12-14 DIAGNOSIS — S301XXD Contusion of abdominal wall, subsequent encounter: Secondary | ICD-10-CM | POA: Diagnosis not present

## 2018-12-14 DIAGNOSIS — S3289XD Fracture of other parts of pelvis, subsequent encounter for fracture with routine healing: Secondary | ICD-10-CM | POA: Diagnosis not present

## 2018-12-14 DIAGNOSIS — Z4800 Encounter for change or removal of nonsurgical wound dressing: Secondary | ICD-10-CM | POA: Diagnosis not present

## 2018-12-14 DIAGNOSIS — S7002XD Contusion of left hip, subsequent encounter: Secondary | ICD-10-CM | POA: Diagnosis not present

## 2018-12-14 DIAGNOSIS — S2232XD Fracture of one rib, left side, subsequent encounter for fracture with routine healing: Secondary | ICD-10-CM | POA: Diagnosis not present

## 2018-12-14 DIAGNOSIS — I69354 Hemiplegia and hemiparesis following cerebral infarction affecting left non-dominant side: Secondary | ICD-10-CM | POA: Diagnosis not present

## 2018-12-14 MED ORDER — FUROSEMIDE 20 MG PO TABS: ORAL_TABLET | ORAL | 1 refills | Status: DC

## 2018-12-14 NOTE — Telephone Encounter (Signed)
PATRICIA FROM [facility] CALLED IN REGARDS TO PATIENT INR 4.2 PT 50.4 , ALSO STATES THAT SHE HAS PITTED SWELLING , EDEMA FROM FEET TO KNEES, SHINY SKIN, SLIGHT BRUISING UNDER SKIN, NO SOB , BP 140/90, PER DR KHAN HOLD COUMADIN TODAY , 1/2 TAB TOMORROW AND WILL CONTINUE RECHECK NEXT WEEK , MAY TAKE PATIENT OFF COUMADIN FOLLOW UP ON 17TH, WILL ADD LASIX 20 MG FOR 3 DAYS THEN EVERY OTHER DAY, NURSE WILL DRAW METB NEXT WEEK HOME VISIT.

## 2018-12-15 ENCOUNTER — Telehealth: Payer: Self-pay

## 2018-12-15 NOTE — Telephone Encounter (Signed)
LMOM TO CONFIRM AND SCREEN FOR 12-19-18 OV.

## 2018-12-18 ENCOUNTER — Other Ambulatory Visit: Payer: Self-pay

## 2018-12-18 DIAGNOSIS — Z4800 Encounter for change or removal of nonsurgical wound dressing: Secondary | ICD-10-CM | POA: Diagnosis not present

## 2018-12-18 DIAGNOSIS — S7002XD Contusion of left hip, subsequent encounter: Secondary | ICD-10-CM | POA: Diagnosis not present

## 2018-12-18 DIAGNOSIS — S301XXD Contusion of abdominal wall, subsequent encounter: Secondary | ICD-10-CM | POA: Diagnosis not present

## 2018-12-18 DIAGNOSIS — S2232XD Fracture of one rib, left side, subsequent encounter for fracture with routine healing: Secondary | ICD-10-CM | POA: Diagnosis not present

## 2018-12-18 DIAGNOSIS — S3289XD Fracture of other parts of pelvis, subsequent encounter for fracture with routine healing: Secondary | ICD-10-CM | POA: Diagnosis not present

## 2018-12-18 DIAGNOSIS — I69354 Hemiplegia and hemiparesis following cerebral infarction affecting left non-dominant side: Secondary | ICD-10-CM | POA: Diagnosis not present

## 2018-12-18 MED ORDER — METOPROLOL TARTRATE 25 MG PO TABS: 12.5000 mg | ORAL_TABLET | Freq: Two times a day (BID) | ORAL | 0 refills | Status: DC

## 2018-12-19 ENCOUNTER — Other Ambulatory Visit: Payer: Self-pay

## 2018-12-19 ENCOUNTER — Encounter: Payer: Self-pay | Admitting: Internal Medicine

## 2018-12-19 ENCOUNTER — Ambulatory Visit (INDEPENDENT_AMBULATORY_CARE_PROVIDER_SITE_OTHER): Payer: Medicare Other | Admitting: Internal Medicine

## 2018-12-19 DIAGNOSIS — G894 Chronic pain syndrome: Secondary | ICD-10-CM

## 2018-12-19 DIAGNOSIS — S32592S Other specified fracture of left pubis, sequela: Secondary | ICD-10-CM

## 2018-12-19 DIAGNOSIS — Z7901 Long term (current) use of anticoagulants: Secondary | ICD-10-CM | POA: Diagnosis not present

## 2018-12-19 DIAGNOSIS — I480 Paroxysmal atrial fibrillation: Secondary | ICD-10-CM

## 2018-12-19 DIAGNOSIS — Z79899 Other long term (current) drug therapy: Secondary | ICD-10-CM | POA: Diagnosis not present

## 2018-12-19 DIAGNOSIS — Z23 Encounter for immunization: Secondary | ICD-10-CM

## 2018-12-19 LAB — POCT INR: INR: 4.3 — AB (ref 2.0–3.0)

## 2018-12-19 MED ORDER — FENTANYL 12 MCG/HR TD PT72: 1.0000 | MEDICATED_PATCH | TRANSDERMAL | 0 refills | Status: DC

## 2018-12-19 MED ORDER — METOPROLOL TARTRATE 25 MG PO TABS: 12.5000 mg | ORAL_TABLET | Freq: Two times a day (BID) | ORAL | 2 refills | Status: DC
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
## 2018-12-19 NOTE — Progress Notes (Signed)
Decrease Coumadin 1 mg qd

## 2018-12-20 DIAGNOSIS — Z4800 Encounter for change or removal of nonsurgical wound dressing: Secondary | ICD-10-CM | POA: Diagnosis not present

## 2018-12-20 DIAGNOSIS — S301XXD Contusion of abdominal wall, subsequent encounter: Secondary | ICD-10-CM | POA: Diagnosis not present

## 2018-12-20 DIAGNOSIS — S2232XD Fracture of one rib, left side, subsequent encounter for fracture with routine healing: Secondary | ICD-10-CM | POA: Diagnosis not present

## 2018-12-20 DIAGNOSIS — I69354 Hemiplegia and hemiparesis following cerebral infarction affecting left non-dominant side: Secondary | ICD-10-CM | POA: Diagnosis not present

## 2018-12-20 DIAGNOSIS — S3289XD Fracture of other parts of pelvis, subsequent encounter for fracture with routine healing: Secondary | ICD-10-CM | POA: Diagnosis not present

## 2018-12-20 DIAGNOSIS — S7002XD Contusion of left hip, subsequent encounter: Secondary | ICD-10-CM | POA: Diagnosis not present

## 2018-12-21 ENCOUNTER — Telehealth: Payer: Self-pay

## 2018-12-21 DIAGNOSIS — S7002XD Contusion of left hip, subsequent encounter: Secondary | ICD-10-CM | POA: Diagnosis not present

## 2018-12-21 DIAGNOSIS — S3289XD Fracture of other parts of pelvis, subsequent encounter for fracture with routine healing: Secondary | ICD-10-CM | POA: Diagnosis not present

## 2018-12-21 DIAGNOSIS — Z4800 Encounter for change or removal of nonsurgical wound dressing: Secondary | ICD-10-CM | POA: Diagnosis not present

## 2018-12-21 DIAGNOSIS — S2232XD Fracture of one rib, left side, subsequent encounter for fracture with routine healing: Secondary | ICD-10-CM | POA: Diagnosis not present

## 2018-12-21 DIAGNOSIS — S301XXD Contusion of abdominal wall, subsequent encounter: Secondary | ICD-10-CM | POA: Diagnosis not present

## 2018-12-21 DIAGNOSIS — I69354 Hemiplegia and hemiparesis following cerebral infarction affecting left non-dominant side: Secondary | ICD-10-CM | POA: Diagnosis not present

## 2018-12-21 DIAGNOSIS — L089 Local infection of the skin and subcutaneous tissue, unspecified: Secondary | ICD-10-CM | POA: Diagnosis not present

## 2018-12-21 NOTE — Telephone Encounter (Signed)
Patient has been using the fentanyl patches today is second day with it on and it is causing nausea and itching , request for hydrocodone instead of the patches

## 2018-12-21 NOTE — Telephone Encounter (Signed)
She was given #10 tabs on 11/10, will be due on 11/30.. I will do a refill on Tuesday. Stop Duragesic patch due to side effects

## 2018-12-21 NOTE — Telephone Encounter (Signed)
Pt advised on medication for hydrocodone will be filled on the 30 th of November and she can bring the patches into pharmacy or to office here so we can dispose of them for her

## 2018-12-22 ENCOUNTER — Telehealth: Payer: Self-pay

## 2018-12-22 DIAGNOSIS — Z4800 Encounter for change or removal of nonsurgical wound dressing: Secondary | ICD-10-CM | POA: Diagnosis not present

## 2018-12-22 DIAGNOSIS — S2232XD Fracture of one rib, left side, subsequent encounter for fracture with routine healing: Secondary | ICD-10-CM | POA: Diagnosis not present

## 2018-12-22 DIAGNOSIS — S301XXD Contusion of abdominal wall, subsequent encounter: Secondary | ICD-10-CM | POA: Diagnosis not present

## 2018-12-22 DIAGNOSIS — S7002XD Contusion of left hip, subsequent encounter: Secondary | ICD-10-CM | POA: Diagnosis not present

## 2018-12-22 DIAGNOSIS — S3289XD Fracture of other parts of pelvis, subsequent encounter for fracture with routine healing: Secondary | ICD-10-CM | POA: Diagnosis not present

## 2018-12-22 DIAGNOSIS — I69354 Hemiplegia and hemiparesis following cerebral infarction affecting left non-dominant side: Secondary | ICD-10-CM | POA: Diagnosis not present

## 2018-12-22 NOTE — Telephone Encounter (Signed)
Patient's caretaker called in regards to patient needing something for pain. Patient was advised by Jeral Fruit that she could bring the remaining fentanyl patches to our office. Advised the provider. Will send something else in to the pharmacy when they are able. Patient's caretaker advised.

## 2018-12-25 ENCOUNTER — Other Ambulatory Visit: Payer: Self-pay

## 2018-12-25 ENCOUNTER — Telehealth: Payer: Self-pay

## 2018-12-25 DIAGNOSIS — G894 Chronic pain syndrome: Secondary | ICD-10-CM

## 2018-12-25 DIAGNOSIS — S32592S Other specified fracture of left pubis, sequela: Secondary | ICD-10-CM

## 2018-12-25 MED ORDER — HYDROCODONE-ACETAMINOPHEN 10-325 MG PO TABS: ORAL_TABLET | ORAL | 0 refills | Status: DC

## 2018-12-25 NOTE — Telephone Encounter (Signed)
[facility] CALLED FOR THE ETIOLOGY FOR PT WOUND. SPOKE WITH DR. [facility] AND NOTIFIED [facility] THAT IT IS DUE TO FRACTURE. LYNN STATED DUE TO INSURANCE PURPOSES THEY NEED TO KNOW MORE ABOUT THE FRACTURE AND SURGERY. LYNN STATED THAT SHE WILL BE REQUESTING ED NOTES.

## 2018-12-26 ENCOUNTER — Telehealth: Payer: Self-pay

## 2018-12-26 DIAGNOSIS — S3289XD Fracture of other parts of pelvis, subsequent encounter for fracture with routine healing: Secondary | ICD-10-CM | POA: Diagnosis not present

## 2018-12-26 DIAGNOSIS — S7002XD Contusion of left hip, subsequent encounter: Secondary | ICD-10-CM | POA: Diagnosis not present

## 2018-12-26 DIAGNOSIS — Z4800 Encounter for change or removal of nonsurgical wound dressing: Secondary | ICD-10-CM | POA: Diagnosis not present

## 2018-12-26 DIAGNOSIS — S2232XD Fracture of one rib, left side, subsequent encounter for fracture with routine healing: Secondary | ICD-10-CM | POA: Diagnosis not present

## 2018-12-26 DIAGNOSIS — S301XXD Contusion of abdominal wall, subsequent encounter: Secondary | ICD-10-CM | POA: Diagnosis not present

## 2018-12-26 DIAGNOSIS — I69354 Hemiplegia and hemiparesis following cerebral infarction affecting left non-dominant side: Secondary | ICD-10-CM | POA: Diagnosis not present

## 2018-12-26 NOTE — Telephone Encounter (Signed)
[facility] CALLED TO NOTIFY us THAT PT REFUSED [place].

## 2019-01-01 ENCOUNTER — Telehealth: Payer: Self-pay

## 2019-01-01 DIAGNOSIS — I69354 Hemiplegia and hemiparesis following cerebral infarction affecting left non-dominant side: Secondary | ICD-10-CM | POA: Diagnosis not present

## 2019-01-01 DIAGNOSIS — S3289XD Fracture of other parts of pelvis, subsequent encounter for fracture with routine healing: Secondary | ICD-10-CM | POA: Diagnosis not present

## 2019-01-01 DIAGNOSIS — Z4800 Encounter for change or removal of nonsurgical wound dressing: Secondary | ICD-10-CM | POA: Diagnosis not present

## 2019-01-01 DIAGNOSIS — S2232XD Fracture of one rib, left side, subsequent encounter for fracture with routine healing: Secondary | ICD-10-CM | POA: Diagnosis not present

## 2019-01-01 DIAGNOSIS — S7002XD Contusion of left hip, subsequent encounter: Secondary | ICD-10-CM | POA: Diagnosis not present

## 2019-01-01 DIAGNOSIS — S301XXD Contusion of abdominal wall, subsequent encounter: Secondary | ICD-10-CM | POA: Diagnosis not present

## 2019-01-01 NOTE — Telephone Encounter (Signed)
Telephone call to patient to schedule palliative care visit with patient. Patient/family in agreement with home visit on 01-11-19 at 12:00 PM.

## 2019-01-04 DIAGNOSIS — M6281 Muscle weakness (generalized): Secondary | ICD-10-CM | POA: Diagnosis not present

## 2019-01-04 DIAGNOSIS — I110 Hypertensive heart disease with heart failure: Secondary | ICD-10-CM | POA: Diagnosis not present

## 2019-01-04 DIAGNOSIS — S7002XD Contusion of left hip, subsequent encounter: Secondary | ICD-10-CM | POA: Diagnosis not present

## 2019-01-04 DIAGNOSIS — E782 Mixed hyperlipidemia: Secondary | ICD-10-CM | POA: Diagnosis not present

## 2019-01-04 DIAGNOSIS — S3289XD Fracture of other parts of pelvis, subsequent encounter for fracture with routine healing: Secondary | ICD-10-CM | POA: Diagnosis not present

## 2019-01-04 DIAGNOSIS — I480 Paroxysmal atrial fibrillation: Secondary | ICD-10-CM | POA: Diagnosis not present

## 2019-01-04 DIAGNOSIS — I2510 Atherosclerotic heart disease of native coronary artery without angina pectoris: Secondary | ICD-10-CM | POA: Diagnosis not present

## 2019-01-04 DIAGNOSIS — M129 Arthropathy, unspecified: Secondary | ICD-10-CM | POA: Diagnosis not present

## 2019-01-04 DIAGNOSIS — F411 Generalized anxiety disorder: Secondary | ICD-10-CM | POA: Diagnosis not present

## 2019-01-04 DIAGNOSIS — Z9181 History of falling: Secondary | ICD-10-CM | POA: Diagnosis not present

## 2019-01-04 DIAGNOSIS — Z7901 Long term (current) use of anticoagulants: Secondary | ICD-10-CM | POA: Diagnosis not present

## 2019-01-04 DIAGNOSIS — G8929 Other chronic pain: Secondary | ICD-10-CM | POA: Diagnosis not present

## 2019-01-04 DIAGNOSIS — I69354 Hemiplegia and hemiparesis following cerebral infarction affecting left non-dominant side: Secondary | ICD-10-CM | POA: Diagnosis not present

## 2019-01-04 DIAGNOSIS — M5440 Lumbago with sciatica, unspecified side: Secondary | ICD-10-CM | POA: Diagnosis not present

## 2019-01-04 DIAGNOSIS — N179 Acute kidney failure, unspecified: Secondary | ICD-10-CM | POA: Diagnosis not present

## 2019-01-04 DIAGNOSIS — W19XXXD Unspecified fall, subsequent encounter: Secondary | ICD-10-CM | POA: Diagnosis not present

## 2019-01-04 DIAGNOSIS — S2232XD Fracture of one rib, left side, subsequent encounter for fracture with routine healing: Secondary | ICD-10-CM | POA: Diagnosis not present

## 2019-01-04 DIAGNOSIS — Q762 Congenital spondylolisthesis: Secondary | ICD-10-CM | POA: Diagnosis not present

## 2019-01-04 DIAGNOSIS — M5135 Other intervertebral disc degeneration, thoracolumbar region: Secondary | ICD-10-CM | POA: Diagnosis not present

## 2019-01-04 DIAGNOSIS — Z4800 Encounter for change or removal of nonsurgical wound dressing: Secondary | ICD-10-CM | POA: Diagnosis not present

## 2019-01-04 DIAGNOSIS — S301XXD Contusion of abdominal wall, subsequent encounter: Secondary | ICD-10-CM | POA: Diagnosis not present

## 2019-01-04 DIAGNOSIS — Z5181 Encounter for therapeutic drug level monitoring: Secondary | ICD-10-CM | POA: Diagnosis not present

## 2019-01-04 DIAGNOSIS — D500 Iron deficiency anemia secondary to blood loss (chronic): Secondary | ICD-10-CM | POA: Diagnosis not present

## 2019-01-04 DIAGNOSIS — J449 Chronic obstructive pulmonary disease, unspecified: Secondary | ICD-10-CM | POA: Diagnosis not present

## 2019-01-04 DIAGNOSIS — I509 Heart failure, unspecified: Secondary | ICD-10-CM | POA: Diagnosis not present

## 2019-01-11 ENCOUNTER — Other Ambulatory Visit: Payer: Medicare Other

## 2019-01-11 ENCOUNTER — Other Ambulatory Visit: Payer: Self-pay

## 2019-01-11 DIAGNOSIS — S3289XD Fracture of other parts of pelvis, subsequent encounter for fracture with routine healing: Secondary | ICD-10-CM | POA: Diagnosis not present

## 2019-01-11 DIAGNOSIS — Z4800 Encounter for change or removal of nonsurgical wound dressing: Secondary | ICD-10-CM | POA: Diagnosis not present

## 2019-01-11 DIAGNOSIS — Z515 Encounter for palliative care: Secondary | ICD-10-CM

## 2019-01-11 DIAGNOSIS — I69354 Hemiplegia and hemiparesis following cerebral infarction affecting left non-dominant side: Secondary | ICD-10-CM | POA: Diagnosis not present

## 2019-01-11 DIAGNOSIS — S7002XD Contusion of left hip, subsequent encounter: Secondary | ICD-10-CM | POA: Diagnosis not present

## 2019-01-11 DIAGNOSIS — S2232XD Fracture of one rib, left side, subsequent encounter for fracture with routine healing: Secondary | ICD-10-CM | POA: Diagnosis not present

## 2019-01-11 DIAGNOSIS — S301XXD Contusion of abdominal wall, subsequent encounter: Secondary | ICD-10-CM | POA: Diagnosis not present

## 2019-01-11 NOTE — Progress Notes (Signed)
COMMUNITY PALLIATIVE CARE SW NOTE  PATIENT NAME: Teresa Holmes DOB: 03/18/1950 MRN: [id number]  PRIMARY CARE PROVIDER: Ronnell Freshwater, NP  RESPONSIBLE PARTY:  Acct ID - Guarantor Home Phone Work Phone Relationship Acct Type  [id number] Teresa [phone]  Self P/F     Teresa Holmes, Teresa Holmes, Teresa Holmes [address]     PLAN OF CARE and INTERVENTIONS:             1. GOALS OF CARE/ ADVANCE CARE PLANNING:  Patient is FULL CODE. HCPOA is Teresa Holmes (patient's longtime friend). Goal is to continue improving and be more independent. 2. SOCIAL/EMOTIONAL/SPIRITUAL ASSESSMENT/ INTERVENTIONS:  SW met with patient and Teresa Holmes (patient's caregiver) in the home. Patient  pain in her hip, but said that her medications help. Teresa Holmes said vitals are stable, checked daily. Teresa Holmes helps perform daily wound care on her lower back/bottom. Patient is eating well and sleeping well. No concerns. Patient is a widow, husband died in 03/27/2011. Patient said Teresa Holmes has been helping for about six months. Patient has one adult daughter, but she is not allowed to come to see patient without supervision. Patient knows to call 9-1-1 or use her LifeAlert if she is unsafe.  3. PATIENT/CAREGIVER EDUCATION/ COPING:  Patient was alert, engaged. Patient openly expressed feelings. Denies concerns. Teresa Holmes and Teresa Holmes are supportive of care. 4. PERSONAL EMERGENCY PLAN:  Family/caregiver will call 9-1-1. Patient has LifeAlert.  5. COMMUNITY RESOURCES COORDINATION/ HEALTH CARE NAVIGATION:  Patient has been discharged from home health. No concerns. 6. FINANCIAL/LEGAL CONCERNS/INTERVENTIONS:  Patient receives Fish farm manager and [facility] supplement.     SOCIAL HX:  Social History   Tobacco Use  . Smoking status: Current Some Day Smoker    Types: Cigarettes  . Smokeless tobacco: Never Used  Substance Use Topics  . Alcohol use: No    CODE STATUS:   Code Status: Prior (FULL CODE) ADVANCED DIRECTIVES: Y MOST FORM  COMPLETE:  No. HOSPICE EDUCATION PROVIDED: None.  PPS: Patient needs assistance with bathing and dressing. Patient is ambulating with a cane.   I spent 30 minutes with patient/family, from 12:00-12:30p providing education, support and consultation.   Teresa Lombard, LCSW

## 2019-01-18 ENCOUNTER — Telehealth: Payer: Self-pay | Admitting: Internal Medicine

## 2019-01-18 ENCOUNTER — Other Ambulatory Visit: Payer: Self-pay

## 2019-01-18 DIAGNOSIS — E039 Hypothyroidism, unspecified: Secondary | ICD-10-CM

## 2019-01-18 MED ORDER — LEVOTHYROXINE SODIUM 50 MCG PO TABS: 50.0000 ug | ORAL_TABLET | Freq: Every day | ORAL | 3 refills | Status: DC

## 2019-01-18 NOTE — Telephone Encounter (Signed)
Patient's care taker called requesting patient was needing a refill on her Hydrocodone.

## 2019-01-20 ENCOUNTER — Other Ambulatory Visit: Payer: Self-pay | Admitting: Internal Medicine

## 2019-01-20 DIAGNOSIS — G894 Chronic pain syndrome: Secondary | ICD-10-CM

## 2019-01-20 DIAGNOSIS — S32592S Other specified fracture of left pubis, sequela: Secondary | ICD-10-CM

## 2019-01-20 MED ORDER — HYDROCODONE-ACETAMINOPHEN 10-325 MG PO TABS: ORAL_TABLET | ORAL | 0 refills | Status: DC

## 2019-01-22 ENCOUNTER — Telehealth: Payer: Self-pay

## 2019-01-22 NOTE — Telephone Encounter (Signed)
ORDER'S SIGNED AND PLACED IN [place] ON 01-22-19.

## 2019-02-08 ENCOUNTER — Telehealth: Payer: Self-pay

## 2019-02-08 NOTE — Telephone Encounter (Signed)
Telephone call to patients hired caregiver Cassranda.  Cassranda in agreement with RN making home visit on 02/14/19 at 11:00 AM.

## 2019-02-13 ENCOUNTER — Telehealth: Payer: Self-pay

## 2019-02-13 NOTE — Telephone Encounter (Signed)
Telephone call from patients caregiver [name]. Caregiver requests patients palliative care visit be rescheduled for 02/20/19 at 10:00 AM.  Caregiver reports patient has another appointment tomorrow and will not be available for palliative care home visit.

## 2019-02-14 ENCOUNTER — Other Ambulatory Visit: Payer: Medicare Other

## 2019-02-16 ENCOUNTER — Telehealth: Payer: Self-pay

## 2019-02-16 NOTE — Telephone Encounter (Signed)
PHYSICIAN ORDERS SIGNED AND PLACED IN [facility].

## 2019-02-20 ENCOUNTER — Other Ambulatory Visit: Payer: Self-pay

## 2019-02-20 ENCOUNTER — Other Ambulatory Visit: Payer: Medicare Other

## 2019-02-23 ENCOUNTER — Other Ambulatory Visit: Payer: Self-pay

## 2019-02-23 DIAGNOSIS — S32592S Other specified fracture of left pubis, sequela: Secondary | ICD-10-CM

## 2019-02-23 DIAGNOSIS — G894 Chronic pain syndrome: Secondary | ICD-10-CM

## 2019-02-26 ENCOUNTER — Other Ambulatory Visit: Payer: Medicare Other

## 2019-02-26 ENCOUNTER — Other Ambulatory Visit: Payer: Self-pay

## 2019-02-26 DIAGNOSIS — Z515 Encounter for palliative care: Secondary | ICD-10-CM

## 2019-02-26 MED ORDER — HYDROCODONE-ACETAMINOPHEN 10-325 MG PO TABS: ORAL_TABLET | ORAL | 0 refills | Status: DC

## 2019-02-26 NOTE — Progress Notes (Signed)
PATIENT NAME: Teresa Holmes DOB: May 06, 1950 MRN: [id number]  PRIMARY CARE PROVIDER: Ronnell Freshwater, NP  RESPONSIBLE PARTY:  Acct ID - Guarantor Home Phone Work Phone Relationship Acct Type  [id number] Gillermo [phone]  Self P/F     [place], [place], [place] [address]    PLAN OF CARE and INTERVENTIONS:               1.  GOALS OF CARE/ ADVANCE CARE PLANNING:  Remain at home with 24/7 caregiver.               2.  PATIENT/CAREGIVER EDUCATION:  Education on fall precautions, education on CoVid 19 vaccine, education on wound care, education on s/s of infection, reviewed meds, support               3.  DISEASE STATUS: RN made scheduled skilled nursing visit. Patient sitting in chair in living room. Patient hired caregiver [name] in home with patient. Patient denies having any pain other than soreness that she feels like is arthritic.  Patient reports she has been doing well and sees no reason to continue receiving monthly visits. Patient in agreement with nurse practitioner checking on her every 2 to 3 months. Patient will be transferred to palliative care nurse practitioner. Patients weight after coming home from rehab was 85.2 pounds, patient's current weight 105.2 pounds. Patient denies suffering any recent falls. Patient continues to ambulate with her cane.  Caregiver is in the home with patient 24/7. Caregiver reports sacral wound has healed however she is keeping a dressing over area for a few more days to make sure wound does not reopen. Patient does have a small area on left heel where home health nurse removed patient shoe/brace caused patient to have a small blister. Caregiver reports she has been applying wet to dry dressing to area. Patient is no longer receiving Home Health Services. Patient denies having any extreme shortness of breath but states she sometimes will give out if she really over does it. Patient has nebulizer treatments that she can do 2 times per day if  needed but reports she only does neb treatments as needed. Patient has a productive cough of white sputum and states she has been taking Mucinex. Patient continues to take 40 mg of Lasix daily. Patient has no  edema to lower extremities. Patient continues to smoke. Patient's vital signs are stable. Patient states and caregiver confirms patient has had no changes in current medications. Patient and caregiver encouraged to contact palliative care team with questions or concerns.    HISTORY OF PRESENT ILLNESS:  Patient is a 69 year old female who resides at home with 24/7 caregiver.  Patient has been doing well and feels she no longer needs nursing visits. Patient will continued to be followed by palliative care NP.    CODE STATUS: Full Code  ADVANCED DIRECTIVES: Y MOST FORM: No PPS:60%   PHYSICAL EXAM:   VITALS: Today's Vitals   02/26/19 1150  BP: 140/90  Pulse: 70  Resp: 18  Temp: (!) 97.2 F (36.2 C)  TempSrc: Temporal  SpO2: 95%  Weight: 105 lb 3.2 oz (47.7 kg)  PainSc: 0-No pain    LUNGS: coarse sounds heard, decreased breath sounds CARDIAC: Cor RRR  EXTREMITIES: none edema SKIN: small wound on left foot, caregiver has been performing wound care.  Home Health has discharged patient.  NEURO: positive for gait problems and memory problems       Nilda Simmer, RN

## 2019-02-28 ENCOUNTER — Encounter: Payer: Self-pay | Admitting: *Deleted

## 2019-03-01 ENCOUNTER — Telehealth: Payer: Self-pay | Admitting: Nurse Practitioner

## 2019-03-01 NOTE — Telephone Encounter (Signed)
Spoke with patient's caregiver, Vito Backers and I have scheduled a Telephone Palliative f/u visit for 04/03/19 @ 11 AM with the NP.

## 2019-03-07 ENCOUNTER — Other Ambulatory Visit: Payer: Self-pay

## 2019-03-07 DIAGNOSIS — E782 Mixed hyperlipidemia: Secondary | ICD-10-CM

## 2019-03-07 MED ORDER — ATORVASTATIN CALCIUM 40 MG PO TABS: 40.0000 mg | ORAL_TABLET | Freq: Every day | ORAL | 0 refills | Status: DC

## 2019-03-07 MED ORDER — FENOFIBRATE 145 MG PO TABS: 145.0000 mg | ORAL_TABLET | Freq: Every day | ORAL | 0 refills | Status: DC

## 2019-03-16 ENCOUNTER — Other Ambulatory Visit: Payer: Self-pay

## 2019-03-16 DIAGNOSIS — E782 Mixed hyperlipidemia: Secondary | ICD-10-CM

## 2019-03-16 MED ORDER — ATORVASTATIN CALCIUM 40 MG PO TABS: 40.0000 mg | ORAL_TABLET | Freq: Every day | ORAL | 1 refills | Status: DC

## 2019-03-16 MED ORDER — FENOFIBRATE 145 MG PO TABS: 145.0000 mg | ORAL_TABLET | Freq: Every day | ORAL | 1 refills | Status: DC

## 2019-03-26 ENCOUNTER — Telehealth: Payer: Self-pay

## 2019-03-26 ENCOUNTER — Other Ambulatory Visit: Payer: Self-pay

## 2019-03-26 ENCOUNTER — Telehealth: Payer: Self-pay | Admitting: Nurse Practitioner

## 2019-03-26 ENCOUNTER — Other Ambulatory Visit: Payer: Self-pay | Admitting: Nurse Practitioner

## 2019-03-26 DIAGNOSIS — S32592S Other specified fracture of left pubis, sequela: Secondary | ICD-10-CM

## 2019-03-26 DIAGNOSIS — G894 Chronic pain syndrome: Secondary | ICD-10-CM

## 2019-03-26 MED ORDER — HYDROCODONE-ACETAMINOPHEN 10-325 MG PO TABS: ORAL_TABLET | ORAL | 0 refills | Status: DC

## 2019-03-26 NOTE — Telephone Encounter (Signed)
Spoke with pt caregiver [phone]

## 2019-03-26 NOTE — Telephone Encounter (Signed)
Refilled hydrocodone/APAP and sent to [facility]. Nimisha already asked DFK and got the approval for me to do this.

## 2019-03-26 NOTE — Telephone Encounter (Signed)
Refilled hydrocodone/APAP and sent to [facility].

## 2019-03-26 NOTE — Telephone Encounter (Signed)
Pt advised we send  med to [facility]  

## 2019-03-26 NOTE — Progress Notes (Signed)
Refilled hydrocodone/APAP and sent to [facility].

## 2019-04-03 ENCOUNTER — Other Ambulatory Visit: Payer: Medicare Other | Admitting: Nurse Practitioner

## 2019-04-03 ENCOUNTER — Other Ambulatory Visit: Payer: Self-pay

## 2019-04-03 ENCOUNTER — Encounter: Payer: Self-pay | Admitting: Nurse Practitioner

## 2019-04-03 DIAGNOSIS — I639 Cerebral infarction, unspecified: Secondary | ICD-10-CM | POA: Diagnosis not present

## 2019-04-03 DIAGNOSIS — Z515 Encounter for palliative care: Secondary | ICD-10-CM | POA: Diagnosis not present

## 2019-04-03 NOTE — Progress Notes (Signed)
[facility] Palliative Care Consult Note Telephone: [phone]  Fax: [phone]  PATIENT NAME: Teresa Holmes DOB: 29-Mar-1950 MRN: [id number]  PRIMARY CARE PROVIDER:   Ronnell Freshwater, NP  REFERRING PROVIDER:  Ronnell Freshwater, NP [address] [place],  [place] [address]  RESPONSIBLE PARTY:  Self; [id number]   Due to the COVID-19 crisis, this visit was done via telemedicine from my office and it was initiated and consent by this patient and or family.   RECOMMENDATIONS and PLAN:  1. ACP: DNR placed in Vynca; will mail blank MOST form and Hard Choice book for review to revisit next PC visit.    2. Palliative care encounter; Palliative medicine team will continue to support patient, patient's family, and medical team. Visit consisted of counseling and education dealing with the complex and emotionally intense issues of symptom management and palliative care in the setting of serious and potentially life-threatening illness  I spent 70 minutes providing this consultation,  from 11:00am to 12:10pm. More than 50% of the time in this consultation was spent coordinating communication.   HISTORY OF PRESENT ILLNESS:  Teresa Holmes is a 69 y.o. year old female with multiple medical problems including CVA, hemiplegic, COPD, CHF, HTN, aneurysm, thyroid disease, HLD, arthritis, congenital spondylolisthesis, vitamin D deficiency, myalgias, chronic pain disorder, anxiety, aortic renal bypass, abdominal hysterectomy. Hospitalize 8 / 27 / 2020 to 9 / 02/2018 after a fall with a pelvic fracture, left inferior pubic ramus fracture left rib fracture non-operative. Teresa Holmes [facility] to [facility] then d/c home where she currently resides with caregivers. Teresa Holmes came home from [place] she was 85.2 pounds in current weight is 105.2 lb she continues to ambulate with a cane. Sacral wound has healed. Small blister on left heel. No longer receiving Home  Health Services. Teresa Holmes does do nebulizer treatments twice a day if she needs to. Teresa Holmes does continue to smoke. I called Teresa Holmes, Teresa Holmes for schedule telemedicine is video not available telephonic follow-up palliative care visit. Teresa Holmes, Teresa Holmes and I talked about purpose of palliative care visit. Teresa Holmes in Holmes. We talked about how she has been doing since she has been home. Teresa Holmes and Teresa Ordner endorse that she is doing better. Her appetite has increased with a significant weight gain. We talked about her functional level for which she is able to ambulate though there is concern for fall risk. We talked about the importance with Teresa Holmes about using the bedside commode at night when no one is with her rather than attempting to go to the bathroom. Teresa Holmes denies pain. We talked at length about medical goals of care, chronic disease progression. We talked about blank MOST form with Hard Choice book for which they are open to having mailed for review. We talked about code status as she currently has a full code. We talked about different scenarios with CPR. Teresa Holmes endorses that she wishes to be a DNR. DNR form completed and placed an Epic/Vynca. We talked about role of palliative care and plan of care. Discuss that at present time Teresa. Holmes does appear stable and will follow up in six weeks if needed or sooner should she declined. Teresa Holmes with Teresa Holmes. Appointment schedule. Therapeutic listening emotional support provided. Contact information. Questions answered to satisfaction. Palliative Care was asked to help address goals of care.   CODE STATUS: DNR  PPS: 50% HOSPICE ELIGIBILITY/DIAGNOSIS:  TBD  PAST MEDICAL HISTORY:  Past Medical History:  Diagnosis Date  . Aneurysm ([place])   . Anxiety   . Arthritis   . Cancer ([place])   . CHF (congestive heart failure) ([place])   . COPD (chronic obstructive pulmonary disease) ([place])    . Hyperlipidemia   . Hypertension   . Stroke ([place])   . Thyroid disease     SOCIAL HX:  Social History   Tobacco Use  . Smoking status: Current Some Day Smoker    Types: Cigarettes  . Smokeless tobacco: Never Used  Substance Use Topics  . Alcohol use: No    ALLERGIES:  Allergies  Allergen Reactions  . Aspirin Other (See Comments)    ulcer  . Sulfa Antibiotics Other (See Comments)    unknown     PERTINENT MEDICATIONS:  Outpatient Encounter Medications as of 04/03/2019  Medication Sig  . acetaminophen (TYLENOL) 325 MG tablet Take 2 tablets (650 mg total) by mouth every 6 (six) hours as needed for mild pain (or Fever >/= 101).  Marland Kitchen albuterol (VENTOLIN HFA) 108 (90 Base) MCG/ACT inhaler Inhale 2 puffs into the lungs every 4 (four) hours as needed for wheezing or shortness of breath.  Marland Kitchen arformoterol (BROVANA) 15 MCG/2ML NEBU Take 2 mLs (15 mcg total) by nebulization 2 (two) times daily.  Marland Kitchen atorvastatin (LIPITOR) 40 MG tablet Take 1 tablet (40 mg total) by mouth daily.  . fenofibrate (TRICOR) 145 MG tablet Take 1 tablet (145 mg total) by mouth daily.  . ferrous sulfate (FERROUSUL) 325 (65 FE) MG tablet Take 1 tablet (325 mg total) by mouth daily with breakfast.  . furosemide (LASIX) 20 MG tablet 20 MG FOR 3 DAYS THEN ONE EVERY OTHER DAY  . guaiFENesin (MUCINEX) 600 MG 12 hr tablet Take 1 mg by mouth every morning.  Marland Kitchen HYDROcodone-acetaminophen (NORCO) 10-325 MG tablet Take half tab bid prn for acute and severe hip pain  . levothyroxine (SYNTHROID) 50 MCG tablet Take 1 tablet (50 mcg total) by mouth daily before breakfast.  . lidocaine (LIDODERM) 5 % Place 1 patch onto the skin daily. Remove & Discard patch within 12 hours or as directed by MD  . metoprolol tartrate (LOPRESSOR) 25 MG tablet Take 0.5 tablets (12.5 mg total) by mouth 2 (two) times daily.  Marland Kitchen warfarin (COUMADIN) 2 MG tablet Take 2 mg by mouth daily.   No facility-administered encounter medications on file as of 04/03/2019.      PHYSICAL EXAM:   deferred  Teresa Holmes Teresa Gully, NP

## 2019-04-05 ENCOUNTER — Telehealth: Payer: Self-pay

## 2019-04-05 NOTE — Telephone Encounter (Signed)
CONFIRMATION OF ORDER SIGNED AND PLACED IN [place] B[facility].

## 2019-04-13 ENCOUNTER — Other Ambulatory Visit: Payer: Self-pay

## 2019-04-13 MED ORDER — FUROSEMIDE 20 MG PO TABS: ORAL_TABLET | ORAL | 1 refills | Status: DC

## 2019-04-17 ENCOUNTER — Telehealth: Payer: Self-pay

## 2019-04-17 NOTE — Telephone Encounter (Signed)
CALLED 3 TIMES BOTH NUMBERS IN PATIENT'S CHART TO CONFRIM 04-19-19 OV. NO VM AND NO ANSWER WAS NOT ABLE TO CONFIRM THE APPT.

## 2019-04-19 ENCOUNTER — Ambulatory Visit (INDEPENDENT_AMBULATORY_CARE_PROVIDER_SITE_OTHER): Payer: Medicare Other | Admitting: Adult Health

## 2019-04-19 ENCOUNTER — Other Ambulatory Visit: Payer: Self-pay

## 2019-04-19 ENCOUNTER — Encounter: Payer: Self-pay | Admitting: Adult Health

## 2019-04-19 VITALS — BP 168/75 | HR 75 | Temp 97.0°F | Resp 16 | Ht <= 58 in | Wt 87.2 lb

## 2019-04-19 DIAGNOSIS — Z1231 Encounter for screening mammogram for malignant neoplasm of breast: Secondary | ICD-10-CM | POA: Diagnosis not present

## 2019-04-19 DIAGNOSIS — F17210 Nicotine dependence, cigarettes, uncomplicated: Secondary | ICD-10-CM | POA: Diagnosis not present

## 2019-04-19 DIAGNOSIS — S32592S Other specified fracture of left pubis, sequela: Secondary | ICD-10-CM

## 2019-04-19 DIAGNOSIS — G8929 Other chronic pain: Secondary | ICD-10-CM | POA: Diagnosis not present

## 2019-04-19 DIAGNOSIS — G894 Chronic pain syndrome: Secondary | ICD-10-CM | POA: Diagnosis not present

## 2019-04-19 DIAGNOSIS — I10 Essential (primary) hypertension: Secondary | ICD-10-CM

## 2019-04-19 DIAGNOSIS — E039 Hypothyroidism, unspecified: Secondary | ICD-10-CM

## 2019-04-19 DIAGNOSIS — M79672 Pain in left foot: Secondary | ICD-10-CM | POA: Diagnosis not present

## 2019-04-19 DIAGNOSIS — I480 Paroxysmal atrial fibrillation: Secondary | ICD-10-CM | POA: Diagnosis not present

## 2019-04-19 MED ORDER — HYDROCODONE-ACETAMINOPHEN 10-325 MG PO TABS: ORAL_TABLET | ORAL | 0 refills | Status: DC

## 2019-04-19 NOTE — Progress Notes (Signed)
[facility] [place], [place] [address]  Internal MEDICINE  Office Visit Note  Patient Name: Teresa Holmes  [id number]  [id number]  Date of Service: 04/19/2019  Chief Complaint  Patient presents with  . Follow-up  . Hyperlipidemia  . Hypertension  . Foot Pain    left foot sore tender    HPI  Pt is here for follow up with HLD, chronic pain disorder, afib, and HTN.  She is joined by her caregiver today.  She reports overall she is doing well. Her bp is moderately controlled. Denies Chest pain, Shortness of breath, palpitations, headache, or blurred vision.   She does have left side paralysis from CVA. She continues to report some left hip pain from her previous fall.  She had a hematoma evacuation from that fall.  About 5 months ago she had a pelvic fracture and subsequent blood clot removal.  She is doing well, does have some residual tenderness at the site, but wound is closed.   Current Medication: Outpatient Encounter Medications as of 04/19/2019  Medication Sig  . acetaminophen (TYLENOL) 325 MG tablet Take 2 tablets (650 mg total) by mouth every 6 (six) hours as needed for mild pain (or Fever >/= 101).  Marland Kitchen albuterol (VENTOLIN HFA) 108 (90 Base) MCG/ACT inhaler Inhale 2 puffs into the lungs every 4 (four) hours as needed for wheezing or shortness of breath.  Marland Kitchen arformoterol (BROVANA) 15 MCG/2ML NEBU Take 2 mLs (15 mcg total) by nebulization 2 (two) times daily.  Marland Kitchen atorvastatin (LIPITOR) 40 MG tablet Take 1 tablet (40 mg total) by mouth daily.  . fenofibrate (TRICOR) 145 MG tablet Take 1 tablet (145 mg total) by mouth daily.  . ferrous sulfate (FERROUSUL) 325 (65 FE) MG tablet Take 1 tablet (325 mg total) by mouth daily with breakfast.  . furosemide (LASIX) 20 MG tablet 20 MG FOR 3 DAYS THEN ONE EVERY OTHER DAY  . guaiFENesin (MUCINEX) 600 MG 12 hr tablet Take 1 mg by mouth every morning.  Marland Kitchen HYDROcodone-acetaminophen (NORCO) 10-325 MG tablet Take half tab  bid prn for acute and severe hip pain  . levothyroxine (SYNTHROID) 50 MCG tablet Take 1 tablet (50 mcg total) by mouth daily before breakfast.  . lidocaine (LIDODERM) 5 % Place 1 patch onto the skin daily. Remove & Discard patch within 12 hours or as directed by MD  . metoprolol tartrate (LOPRESSOR) 25 MG tablet Take 0.5 tablets (12.5 mg total) by mouth 2 (two) times daily.  Marland Kitchen warfarin (COUMADIN) 2 MG tablet Take 2 mg by mouth daily.  . [DISCONTINUED] HYDROcodone-acetaminophen (NORCO) 10-325 MG tablet Take half tab bid prn for acute and severe hip pain   No facility-administered encounter medications on file as of 04/19/2019.    Surgical History: Past Surgical History:  Procedure Laterality Date  . ABDOMINAL HYSTERECTOMY    . AORTIC/RENAL BYPASS    . bypass graft    . CHOLECYSTECTOMY    . CORONARY ARTERY BYPASS GRAFT      Medical History: Past Medical History:  Diagnosis Date  . Aneurysm ([place])   . Anxiety   . Arthritis   . Cancer ([place])   . CHF (congestive heart failure) ([place])   . COPD (chronic obstructive pulmonary disease) ([place])   . Hyperlipidemia   . Hypertension   . Stroke ([place])   . Thyroid disease     Family History: Family History  Problem Relation Age of Onset  . Cancer Mother  Social History   Socioeconomic History  . Marital status: Married    Spouse name: Not on file  . Number of children: Not on file  . Years of education: Not on file  . Highest education level: Not on file  Occupational History  . Not on file  Tobacco Use  . Smoking status: Former Smoker    Types: Cigarettes  . Smokeless tobacco: Never Used  Substance and Sexual Activity  . Alcohol use: No  . Drug use: Never  . Sexual activity: Not on file  Other Topics Concern  . Not on file  Social History Narrative  . Not on file   Social Determinants of Health   Financial Resource Strain:   . Difficulty of Paying Living Expenses:   Food Insecurity:   . Worried About Sales executive in the Last Year:   . Arboriculturist in the Last Year:   Transportation Needs:   . Film/video editor (Medical):   Marland Kitchen Lack of Transportation (Non-Medical):   Physical Activity:   . Days of Exercise per Week:   . Minutes of Exercise per Session:   Stress:   . Feeling of Stress :   Social Connections:   . Frequency of Communication with Friends and Family:   . Frequency of Social Gatherings with Friends and Family:   . Attends Religious Services:   . Active Member of Clubs or Organizations:   . Attends Archivist Meetings:   Marland Kitchen Marital Status:   Intimate Partner Violence:   . Fear of Current or Ex-Partner:   . Emotionally Abused:   Marland Kitchen Physically Abused:   . Sexually Abused:       Review of Systems  Constitutional: Negative for chills, fatigue and unexpected weight change.  HENT: Negative for congestion, rhinorrhea, sneezing and sore throat.   Eyes: Negative for photophobia, pain and redness.  Respiratory: Negative for cough, chest tightness and shortness of breath.   Cardiovascular: Negative for chest pain and palpitations.  Gastrointestinal: Negative for abdominal pain, constipation, diarrhea, nausea and vomiting.  Endocrine: Negative.   Genitourinary: Negative for dysuria and frequency.  Musculoskeletal: Negative for arthralgias, back pain, joint swelling and neck pain.  Skin: Negative for rash.  Allergic/Immunologic: Negative.   Neurological: Negative for tremors and numbness.  Hematological: Negative for adenopathy. Does not bruise/bleed easily.  Psychiatric/Behavioral: Negative for behavioral problems and sleep disturbance. The patient is not nervous/anxious.     Vital Signs: BP (!) 168/75   Pulse 75   Temp (!) 97 F (36.1 C)   Resp 16   Ht 4\' 9"  (1.448 m)   Wt 87 lb 3.2 oz (39.6 kg)   SpO2 98%   BMI 18.87 kg/m    Physical Exam Vitals and nursing note reviewed.  Constitutional:      General: She is not in acute distress.    Appearance:  She is well-developed. She is not diaphoretic.  HENT:     Head: Normocephalic and atraumatic.     Mouth/Throat:     Pharynx: No oropharyngeal exudate.  Eyes:     Pupils: Pupils are equal, round, and reactive to light.  Neck:     Thyroid: No thyromegaly.     Vascular: No JVD.     Trachea: No tracheal deviation.  Cardiovascular:     Rate and Rhythm: Normal rate and regular rhythm.     Heart sounds: Normal heart sounds. No murmur. No friction rub. No gallop.   Pulmonary:  Effort: Pulmonary effort is normal. No respiratory distress.     Breath sounds: Normal breath sounds. No wheezing or rales.  Chest:     Chest wall: No tenderness.  Abdominal:     Palpations: Abdomen is soft.     Tenderness: There is no abdominal tenderness. There is no guarding.  Musculoskeletal:        General: Normal range of motion.     Cervical back: Normal range of motion and neck supple.  Lymphadenopathy:     Cervical: No cervical adenopathy.  Skin:    General: Skin is warm and dry.  Neurological:     Mental Status: She is alert and oriented to person, place, and time.     Cranial Nerves: No cranial nerve deficit.  Psychiatric:        Behavior: Behavior normal.        Thought Content: Thought content normal.        Judgment: Judgment normal.    Assessment/Plan: 1. Closed fracture of left inferior pubic ramus, sequela Healed surgical wound, some residual pain. Continue to rest at times.  Walk when you can.   2. Chronic foot pain, left Tenderness where brace sits at mid-foot. See podiatry as discussed.   3. Paroxysmal atrial fibrillation (HCC) Continue to follow. No symptoms, HR regular today.   4. Acquired hypothyroidism Tsh was normal at last check, will recheck at next visit. Labs ordered on paper.  5. Chronic pain disorder Reviewed risks and possible side effects associated with taking opiates, benzodiazepines and other CNS depressants. Combination of these could cause dizziness and  drowsiness. Advised patient not to drive or operate machinery when taking these medications, as patient's and other's life can be at risk and will have consequences. Patient verbalized understanding in this matter. Dependence and abuse for these drugs will be monitored closely. A Controlled substance policy and procedure is on file which allows [facility] to order a urine drug screen test at any visit. Patient understands and agrees with the plan - HYDROcodone-acetaminophen (NORCO) 10-325 MG tablet; Take half tab bid prn for acute and severe hip pain  Dispense: 30 tablet; Refill: 0  6. Essential (primary) hypertension Today 168/75, continue to follow.   7. Nicotine dependence, cigarettes, uncomplicated Smoking cessation counseling: 1. Pt acknowledges the risks of long term smoking, she will try to quite smoking. 2. Options for different medications including nicotine products, chewing gum, patch etc, Wellbutrin and Chantix is discussed 3. Goal and date of compete cessation is discussed 4. Total time spent in smoking cessation is 15 min.  General Counseling: Arta verbalizes understanding of the findings of todays visit and agrees with plan of treatment. I have discussed any further diagnostic evaluation that may be needed or ordered today. We also reviewed her medications today. she has been encouraged to call the office with any questions or concerns that should arise related to todays visit.    Orders Placed This Encounter  Procedures  . MM DIGITAL SCREENING BILATERAL    Meds ordered this encounter  Medications  . HYDROcodone-acetaminophen (NORCO) 10-325 MG tablet    Sig: Take half tab bid prn for acute and severe hip pain    Dispense:  30 tablet    Refill:  0    Time spent: 25 Minutes   This patient was seen by Orson Gear AGNP-C in Collaboration with Dr Lavera Guise as a part of collaborative care agreement     Kendell Bane AGNP-C Internal medicine

## 2019-05-03 ENCOUNTER — Other Ambulatory Visit: Payer: Medicare Other | Admitting: Nurse Practitioner

## 2019-05-03 ENCOUNTER — Other Ambulatory Visit: Payer: Self-pay

## 2019-05-03 ENCOUNTER — Encounter: Payer: Self-pay | Admitting: Nurse Practitioner

## 2019-05-03 DIAGNOSIS — I639 Cerebral infarction, unspecified: Secondary | ICD-10-CM

## 2019-05-03 DIAGNOSIS — Z515 Encounter for palliative care: Secondary | ICD-10-CM | POA: Diagnosis not present

## 2019-05-03 NOTE — Progress Notes (Signed)
[facility] Palliative Care Consult Note Telephone: [phone]  Fax: [phone]  PATIENT NAME: Teresa Holmes DOB: 1950-10-05 MRN: [id number]  PRIMARY CARE PROVIDER:   Ronnell Freshwater, NP  REFERRING PROVIDER:  Ronnell Freshwater, NP [address] [place],  [place] [address] RESPONSIBLE PARTY:  Self; [id number]   Due to the COVID-19 crisis, this visit was done via telemedicine from my office and it was initiated and consent by this patient and or family.  RECOMMENDATIONS and PLAN: 1.ACP: DNR placed in [place]; will mail blank MOST form and [place] book for review to revisit next PC visit.   2.Palliative care encounter; Palliative medicine team will continue to support patient, patient's family, and medical team. Visit consisted of counseling and education dealing with the complex and emotionally intense issues of symptom management and palliative care in the setting of serious and potentially life-threatening illness RESPONSIBLE PARTY:     I spent 65 minutes providing this consultation,  from 10:55am to 12:00pm. More than 50% of the time in this consultation was spent coordinating communication.   HISTORY OF PRESENT ILLNESS:  Teresa Holmes is a 69 y.o. year old female with multiple medical problems including CVA, hemiplegic, COPD, CHF, HTN, aneurysm, thyroid disease, HLD, arthritis, congenital spondylolisthesis, vitamin D deficiency, myalgias, chronic pain disorder, anxiety, aortic renal bypass, abdominal hysterectomy. I called Ree Edman Ms Laine caregiver and now Health Care power of attorney. We talked about purpose of palliative care visit. [name] and Ms Gollihue both in agreement. We talked about how she has been doing. [name] endorses Ms Cougill has been continuing to be sneaky and do things on her own such as get up to the bathroom during the middle of the night. [name] reiterates to Ms Demerath is a high fall risk. [name]  endorses that she puts everything including the bedside commode so Ms Kedrowski will not have to go all the way into the bathroom. We talked about symptoms, appetite. Ms Carbary endorses that Vito Backers is a very good cook and keeps her well fed. No noted weight loss. We talked about symptoms of shortness of breath which Ms Arnall denies. We talked about not requiring oxygen required. We talked about the importance of Ms Moxley following directions to keep her safe. Ms Neveu verbalized understanding. We talked about Healthcare power of attorney. Currently Ree Edman is Healthcare power of attorney legally and notarized updated document. [name] endorses that she will send a copy to [employer] for records. Change Healthcare power of attorney in [place]. [name] endorses the previous [place] of attorney there was questionable difficulty with the finances. Ms Picardi is in agreement. We talked about medical goals of care. Currently Ms Gunnison and Vito Backers are on their way to another appointment. MOST form will be Revisited at next palliative care visit. We talked about at present time Ms Kingen is stable. We talked about role of palliative care and plan of care. Discussed will schedule 2 month follow-up visit for going chronic disease management, symptoms, medical goals of care. [name] and Ms Pezzulo both in agreement. Appointment scheduled. Therapeutic listening and emotional support provided. Contact information. Questions answered to satisfaction.   Palliative Care was asked to help to continue to address goals of care.   CODE STATUS: DNR  PPS: 50% HOSPICE ELIGIBILITY/DIAGNOSIS: TBD  PAST MEDICAL HISTORY:  Past Medical History:  Diagnosis Date  . Aneurysm ([place])   . Anxiety   . Arthritis   . Cancer ([place])   .  CHF (congestive heart failure) ([place])   . COPD (chronic obstructive pulmonary disease) ([place])   . Hyperlipidemia   . Hypertension   . Stroke ([place])   .  Thyroid disease     SOCIAL HX:  Social History   Tobacco Use  . Smoking status: Former Smoker    Types: Cigarettes  . Smokeless tobacco: Never Used  Substance Use Topics  . Alcohol use: No    ALLERGIES:  Allergies  Allergen Reactions  . Aspirin Other (See Comments)    ulcer  . Sulfa Antibiotics Other (See Comments)    unknown     PERTINENT MEDICATIONS:  Outpatient Encounter Medications as of 05/03/2019  Medication Sig  . acetaminophen (TYLENOL) 325 MG tablet Take 2 tablets (650 mg total) by mouth every 6 (six) hours as needed for mild pain (or Fever >/= 101).  Marland Kitchen albuterol (VENTOLIN HFA) 108 (90 Base) MCG/ACT inhaler Inhale 2 puffs into the lungs every 4 (four) hours as needed for wheezing or shortness of breath.  Marland Kitchen arformoterol (BROVANA) 15 MCG/2ML NEBU Take 2 mLs (15 mcg total) by nebulization 2 (two) times daily.  Marland Kitchen atorvastatin (LIPITOR) 40 MG tablet Take 1 tablet (40 mg total) by mouth daily.  . fenofibrate (TRICOR) 145 MG tablet Take 1 tablet (145 mg total) by mouth daily.  . ferrous sulfate (FERROUSUL) 325 (65 FE) MG tablet Take 1 tablet (325 mg total) by mouth daily with breakfast.  . furosemide (LASIX) 20 MG tablet 20 MG FOR 3 DAYS THEN ONE EVERY OTHER DAY  . guaiFENesin (MUCINEX) 600 MG 12 hr tablet Take 1 mg by mouth every morning.  Marland Kitchen HYDROcodone-acetaminophen (NORCO) 10-325 MG tablet Take half tab bid prn for acute and severe hip pain  . levothyroxine (SYNTHROID) 50 MCG tablet Take 1 tablet (50 mcg total) by mouth daily before breakfast.  . lidocaine (LIDODERM) 5 % Place 1 patch onto the skin daily. Remove & Discard patch within 12 hours or as directed by MD  . metoprolol tartrate (LOPRESSOR) 25 MG tablet Take 0.5 tablets (12.5 mg total) by mouth 2 (two) times daily.  Marland Kitchen warfarin (COUMADIN) 2 MG tablet Take 2 mg by mouth daily.   No facility-administered encounter medications on file as of 05/03/2019.    PHYSICAL EXAM:   deferred  Salah Nakamura Ihor Gully, NP

## 2019-05-21 ENCOUNTER — Other Ambulatory Visit: Payer: Self-pay

## 2019-05-21 DIAGNOSIS — G894 Chronic pain syndrome: Secondary | ICD-10-CM

## 2019-05-24 MED ORDER — HYDROCODONE-ACETAMINOPHEN 10-325 MG PO TABS: ORAL_TABLET | ORAL | 0 refills | Status: DC

## 2019-06-05 ENCOUNTER — Encounter: Payer: Self-pay | Admitting: *Deleted

## 2019-06-11 ENCOUNTER — Other Ambulatory Visit: Payer: Self-pay

## 2019-06-11 ENCOUNTER — Other Ambulatory Visit: Payer: Medicare Other | Admitting: Nurse Practitioner

## 2019-06-18 ENCOUNTER — Other Ambulatory Visit: Payer: Medicare Other | Admitting: Nurse Practitioner

## 2019-06-18 ENCOUNTER — Other Ambulatory Visit: Payer: Self-pay

## 2019-06-18 ENCOUNTER — Telehealth: Payer: Self-pay | Admitting: Nurse Practitioner

## 2019-06-18 NOTE — Telephone Encounter (Signed)
I attempted to contact [name], caregiver for scheduled PC f/u visit for Ms. Gover, no answer, message left with contact information

## 2019-06-19 ENCOUNTER — Telehealth: Payer: Self-pay | Admitting: Nurse Practitioner

## 2019-06-19 NOTE — Telephone Encounter (Signed)
I returned [place] phone call to reschedule. Appointment rescheduled. [name] endorses that she is legally HCPOA. [name] endorses she is very concerned about Ms. Larranaga not following instruction, and considering placing in facility. Discussed with [name] to contact primary provider to schedule visit to obtain [facility] and will do Palliative SW referral for placement, gave name of [facility] and [facility] to start. [name] endorses she may need to go into a rehab as she is not walking as well. [name] in agreement to reschedule PC in person visit, will complete SW consult.  Total time 20 minutes Phone 15 minutes Documentation 5 minutes

## 2019-06-20 ENCOUNTER — Other Ambulatory Visit: Payer: Self-pay

## 2019-06-20 DIAGNOSIS — G894 Chronic pain syndrome: Secondary | ICD-10-CM

## 2019-06-20 DIAGNOSIS — E039 Hypothyroidism, unspecified: Secondary | ICD-10-CM

## 2019-06-20 MED ORDER — LEVOTHYROXINE SODIUM 50 MCG PO TABS: 50.0000 ug | ORAL_TABLET | Freq: Every day | ORAL | 0 refills | Status: DC

## 2019-06-22 MED ORDER — HYDROCODONE-ACETAMINOPHEN 10-325 MG PO TABS: ORAL_TABLET | ORAL | 0 refills | Status: DC

## 2019-07-04 ENCOUNTER — Other Ambulatory Visit: Payer: Self-pay | Admitting: Internal Medicine

## 2019-07-04 DIAGNOSIS — E782 Mixed hyperlipidemia: Secondary | ICD-10-CM

## 2019-07-05 ENCOUNTER — Telehealth: Payer: Self-pay | Admitting: Nurse Practitioner

## 2019-07-05 ENCOUNTER — Other Ambulatory Visit: Payer: Medicare Other | Admitting: Nurse Practitioner

## 2019-07-05 ENCOUNTER — Other Ambulatory Visit: Payer: Self-pay

## 2019-07-05 NOTE — Telephone Encounter (Signed)
Rec'd message to call patient's caregiver, returned call and spoke with caregiver as well as patient, and patient has declined any further Palliative visits.  Explained to patient that we would discharge her and that if she decided that she needed Palliative services in the future to please contact her MD and they would send [place] another referral, patient was in agreement with this.

## 2019-07-05 NOTE — Telephone Encounter (Addendum)
Received a call from [place], Ms. Roylance caregiver. [name] endorses that Ms. Mateja has visiting angels and would like to stop Palliative care. Vito Backers has called multiple times prior to appointments to cancel, or change to telephone from in person. [name] per last discussion was looking for placement as [name] skill level has increased, advised at that time to make an appointment for f/u visit for primary provider for Fl2, also [name] was not walking at that time. [name] endorses she sought assistance from visiting angels. Attempted to explain visiting angels are a in home caregiving program. Palliative care is a provider level care, explained goals of care. [name] endorses Ms. Rumery is doing much better, exercising to walk. I asked to speak with Ms. Weinert got on the phone and yelled, she does not want any services. Attempted to explain to Ms. Shambaugh, she seemed confused about services. Ms. Hake became irritable, ended call, then called back and left a message apologizing for yelling, stating if she changes her mind about palliative she will contact. Will notify PCP, Palliative SW.

## 2019-07-13 ENCOUNTER — Telehealth: Payer: Self-pay

## 2019-07-13 NOTE — Telephone Encounter (Signed)
(  4:18p) SW complete a follow-up call to patient's PCG/POA-Teresa Holmes to assess patient's needs, PCG coping and needs and provide support. Teresa Holmes advised that patient is doing well, but has declined. She is no longer seeking facility placement for patient, as she prefers patient to be at home where she is comfortable through the end of her life. Teresa Holmes also advised that it is because of HER that palliative care is still involved as patient prefers not to be involved in the program or have visits by the palliative care team. SW probed Teresa Holmes for any specific reasons as to why patient felt that way. Teresa Holmes advised that patient has always been a loner and does not desire a lot of people being around her. SW discussed and reinforced with Teresa Holmes the benefits of having the palliative care program and a nurse practitioner that can visit regularly. SW encouraged Teresa Holmes to take advantage of this support. Teresa Holmes stated that she will share this with patient and will encourage patient to allow the nurse practitioner to come for a visit.  Teresa Holmes advised that she looking forward to  telephonic visit scheduled with the nurse practitioner on Thursday. SW provided reassurance of ongoing support and encouraged Teresa Holmes to call with any additional concerns or questions.

## 2019-07-15 ENCOUNTER — Emergency Department: Payer: Medicare Other

## 2019-07-15 ENCOUNTER — Emergency Department
Admission: EM | Admit: 2019-07-15 | Discharge: 2019-07-15 | Disposition: A | Discharge status: Home / Self Care | Payer: Medicare Other | Attending: Student | Admitting: Student

## 2019-07-15 ENCOUNTER — Other Ambulatory Visit: Payer: Self-pay

## 2019-07-15 DIAGNOSIS — I110 Hypertensive heart disease with heart failure: Secondary | ICD-10-CM | POA: Diagnosis not present

## 2019-07-15 DIAGNOSIS — J449 Chronic obstructive pulmonary disease, unspecified: Secondary | ICD-10-CM | POA: Insufficient documentation

## 2019-07-15 DIAGNOSIS — G9389 Other specified disorders of brain: Secondary | ICD-10-CM | POA: Insufficient documentation

## 2019-07-15 DIAGNOSIS — F17210 Nicotine dependence, cigarettes, uncomplicated: Secondary | ICD-10-CM | POA: Insufficient documentation

## 2019-07-15 DIAGNOSIS — E039 Hypothyroidism, unspecified: Secondary | ICD-10-CM | POA: Diagnosis not present

## 2019-07-15 DIAGNOSIS — S52501A Unspecified fracture of the lower end of right radius, initial encounter for closed fracture: Secondary | ICD-10-CM | POA: Insufficient documentation

## 2019-07-15 DIAGNOSIS — Y998 Other external cause status: Secondary | ICD-10-CM | POA: Diagnosis not present

## 2019-07-15 DIAGNOSIS — Y9301 Activity, walking, marching and hiking: Secondary | ICD-10-CM | POA: Diagnosis not present

## 2019-07-15 DIAGNOSIS — S0990XA Unspecified injury of head, initial encounter: Secondary | ICD-10-CM | POA: Diagnosis not present

## 2019-07-15 DIAGNOSIS — W010XXA Fall on same level from slipping, tripping and stumbling without subsequent striking against object, initial encounter: Secondary | ICD-10-CM | POA: Diagnosis not present

## 2019-07-15 DIAGNOSIS — Z79899 Other long term (current) drug therapy: Secondary | ICD-10-CM | POA: Insufficient documentation

## 2019-07-15 DIAGNOSIS — Z8673 Personal history of transient ischemic attack (TIA), and cerebral infarction without residual deficits: Secondary | ICD-10-CM | POA: Diagnosis not present

## 2019-07-15 DIAGNOSIS — Z951 Presence of aortocoronary bypass graft: Secondary | ICD-10-CM | POA: Insufficient documentation

## 2019-07-15 DIAGNOSIS — Z859 Personal history of malignant neoplasm, unspecified: Secondary | ICD-10-CM | POA: Insufficient documentation

## 2019-07-15 DIAGNOSIS — I509 Heart failure, unspecified: Secondary | ICD-10-CM | POA: Insufficient documentation

## 2019-07-15 DIAGNOSIS — Y92018 Other place in single-family (private) house as the place of occurrence of the external cause: Secondary | ICD-10-CM | POA: Diagnosis not present

## 2019-07-15 DIAGNOSIS — S199XXA Unspecified injury of neck, initial encounter: Secondary | ICD-10-CM | POA: Diagnosis not present

## 2019-07-15 DIAGNOSIS — Z7901 Long term (current) use of anticoagulants: Secondary | ICD-10-CM | POA: Diagnosis not present

## 2019-07-15 DIAGNOSIS — M25572 Pain in left ankle and joints of left foot: Secondary | ICD-10-CM | POA: Diagnosis not present

## 2019-07-15 DIAGNOSIS — S6991XA Unspecified injury of right wrist, hand and finger(s), initial encounter: Secondary | ICD-10-CM | POA: Diagnosis present

## 2019-07-15 MED ORDER — ONDANSETRON 4 MG PO TBDP
4.0000 mg | ORAL_TABLET | Freq: Three times a day (TID) | ORAL | 0 refills | Status: AC | PRN
Start: 2019-07-15 — End: 2019-07-20

## 2019-07-15 MED ORDER — OXYCODONE-ACETAMINOPHEN 5-325 MG PO TABS
1.0000 | ORAL_TABLET | Freq: Once | ORAL | Status: AC
  Administered 2019-07-15: 1 via ORAL
  Filled 2019-07-15: qty 1

## 2019-07-15 MED ORDER — OXYCODONE-ACETAMINOPHEN 5-325 MG PO TABS: 1.0000 | ORAL_TABLET | Freq: Three times a day (TID) | ORAL | 0 refills | Status: DC | PRN

## 2019-07-15 MED ORDER — ONDANSETRON 4 MG PO TBDP
4.0000 mg | ORAL_TABLET | Freq: Once | ORAL | Status: AC
  Administered 2019-07-15: 4 mg via ORAL
  Filled 2019-07-15: qty 1

## 2019-07-15 NOTE — ED Provider Notes (Signed)
Emergency Department Provider Note  ____________________________________________  Time seen: Approximately 6:46 PM  I have reviewed the triage vital signs and the nursing notes.   HISTORY  Chief Complaint Fall and Wrist Pain   Historian Patient     HPI Teresa Holmes is a 69 y.o. female presents to the emergency department after a mechanical fall.  Patient sustained 2 small skin tears along right upper arm.  She is primarily complaining of right wrist pain and left ankle pain.  Patient states that she tripped over her carpet.  She denies hitting her head or neck.  No numbness or tingling in the upper and lower extremities.  Patient is currently taking warfarin.   Past Medical History:  Diagnosis Date  . Aneurysm ([place])   . Anxiety   . Arthritis   . Cancer ([place])   . CHF (congestive heart failure) ([place])   . COPD (chronic obstructive pulmonary disease) ([place])   . Hyperlipidemia   . Hypertension   . Stroke ([place])   . Thyroid disease      Immunizations up to date:  Yes.     Past Medical History:  Diagnosis Date  . Aneurysm ([place])   . Anxiety   . Arthritis   . Cancer ([place])   . CHF (congestive heart failure) ([place])   . COPD (chronic obstructive pulmonary disease) ([place])   . Hyperlipidemia   . Hypertension   . Stroke ([place])   . Thyroid disease     Patient Active Problem List   Diagnosis Date Noted  . Pelvic fracture ([place]) 09/28/2018  . Encounter for long-term (current) use of medications 05/11/2017  . Chronic pain disorder 05/11/2017  . Generalized anxiety disorder 03/14/2017  . Essential (primary) hypertension 03/14/2017  . Hemiplegia and hemiparesis following cerebral infarction affecting left non-dominant side ([place]) 03/14/2017  . Vitamin D deficiency, unspecified 03/14/2017  . Personal history of venous thrombosis and embolism 03/14/2017  . Congenital spondylolisthesis 03/14/2017  . Nicotine dependence, cigarettes, uncomplicated 82/50/5397  . Mixed  hyperlipidemia 03/14/2017  . Long term (current) use of anticoagulants 03/14/2017  . Other intervertebral disc degeneration, thoracolumbar region 03/14/2017  . Lumbago with sciatica 03/14/2017  . Cardiac arrhythmia 03/14/2017  . Myalgia 03/14/2017  . Hypothyroidism 03/14/2017  . Difficulty walking 03/14/2017  . Left knee pain 03/14/2017    Past Surgical History:  Procedure Laterality Date  . ABDOMINAL HYSTERECTOMY    . AORTIC/RENAL BYPASS    . bypass graft    . CHOLECYSTECTOMY    . CORONARY ARTERY BYPASS GRAFT      Prior to Admission medications   Medication Sig Start Date End Date Taking? Authorizing Provider  acetaminophen (TYLENOL) 325 MG tablet Take 2 tablets (650 mg total) by mouth every 6 (six) hours as needed for mild pain (or Fever >/= 101). 10/02/18   Bettey Costa, MD  albuterol (VENTOLIN HFA) 108 (90 Base) MCG/ACT inhaler Inhale 2 puffs into the lungs every 4 (four) hours as needed for wheezing or shortness of breath. 05/24/18   Ronnell Freshwater, NP  arformoterol (BROVANA) 15 MCG/2ML NEBU Take 2 mLs (15 mcg total) by nebulization 2 (two) times daily. 07/25/18   Ronnell Freshwater, NP  atorvastatin (LIPITOR) 40 MG tablet Take 1 tablet by mouth once daily 07/04/19   Kendell Bane, NP  fenofibrate (TRICOR) 145 MG tablet Take 1 tablet by mouth once daily 07/04/19   Kendell Bane, NP  ferrous sulfate (FERROUSUL) 325 (65 FE) MG tablet Take 1 tablet (325 mg  total) by mouth daily with breakfast. 10/02/18   Bettey Costa, MD  furosemide (LASIX) 20 MG tablet 20 MG FOR 3 DAYS THEN ONE EVERY OTHER DAY 04/13/19   Ronnell Freshwater, NP  guaiFENesin (MUCINEX) 600 MG 12 hr tablet Take 1 mg by mouth every morning.    [provider]  HYDROcodone-acetaminophen (NORCO) 10-325 MG tablet Take half tab bid prn for acute and severe hip pain 06/22/19   Ronnell Freshwater, NP  levothyroxine (SYNTHROID) 50 MCG tablet Take 1 tablet (50 mcg total) by mouth daily before breakfast. 06/20/19   [place],  Audie Clear, NP  lidocaine (LIDODERM) 5 % Place 1 patch onto the skin daily. Remove & Discard patch within 12 hours or as directed by MD 10/02/18   Bettey Costa, MD  metoprolol tartrate (LOPRESSOR) 25 MG tablet Take 0.5 tablets (12.5 mg total) by mouth 2 (two) times daily. 12/19/18   Lavera Guise, MD  ondansetron (ZOFRAN ODT) 4 MG disintegrating tablet Take 1 tablet (4 mg total) by mouth every 8 (eight) hours as needed for up to 5 days for nausea or vomiting. 07/15/19 07/20/19  Lannie Fields, PA-C  oxyCODONE-acetaminophen (PERCOCET/ROXICET) 5-325 MG tablet Take 1 tablet by mouth every 8 (eight) hours as needed for up to 5 days. 07/15/19 07/20/19  Lannie Fields, PA-C  warfarin (COUMADIN) 2 MG tablet Take 2 mg by mouth daily.    [provider]    Allergies Aspirin and Sulfa antibiotics  Family History  Problem Relation Age of Onset  . Cancer Mother     Social History Social History   Tobacco Use  . Smoking status: Current Every Day Smoker    Types: Cigarettes  . Smokeless tobacco: Never Used  Vaping Use  . Vaping Use: Never used  Substance Use Topics  . Alcohol use: No  . Drug use: Never     Review of Systems  Constitutional: No fever/chills Eyes:  No discharge ENT: No upper respiratory complaints. Respiratory: no cough. No SOB/ use of accessory muscles to breath Gastrointestinal:   No nausea, no vomiting.  No diarrhea.  No constipation. Musculoskeletal: Patient has right wrist pain and left ankle pain. Skin: Negative for rash, abrasions, lacerations, ecchymosis.    ____________________________________________   PHYSICAL EXAM:  VITAL SIGNS: ED Triage Vitals  Enc Vitals Group     BP 07/15/19 1603 (!) 160/79     Pulse Rate 07/15/19 1603 70     Resp 07/15/19 1603 20     Temp 07/15/19 1600 98.8 F (37.1 C)     Temp Source 07/15/19 1600 Oral     SpO2 07/15/19 1603 98 %     Weight 07/15/19 1600 105 lb (47.6 kg)     Height 07/15/19 1600 4\' 7"  (1.397 m)     Head  Circumference --      Peak Flow --      Pain Score 07/15/19 1600 5     Pain Loc --      Pain Edu? --      Excl. in [place]? --      Constitutional: Alert and oriented. Well appearing and in no acute distress. Eyes: Conjunctivae are normal. PERRL. EOMI. Head: Atraumatic. Cardiovascular: Normal rate, regular rhythm. Normal S1 and S2.  Good peripheral circulation. Respiratory: Normal respiratory effort without tachypnea or retractions. Lungs CTAB. Good air entry to the bases with no decreased or absent breath sounds Gastrointestinal: Bowel sounds x 4 quadrants. Soft and nontender to palpation. No guarding  or rigidity. No distention. Musculoskeletal: Patient is unable to perform full range of motion of the right wrist.  Palpable radial pulse, right.  Capillary refill less than 2 seconds on the right.  Patient performs full range of motion at the bilateral hips, knees and ankles. Neurologic:  Normal for age. No gross focal neurologic deficits are appreciated.  Skin:  Skin is warm, dry and intact. No rash noted. Psychiatric: Mood and affect are normal for age. Speech and behavior are normal.   ____________________________________________   LABS (all labs ordered are listed, but only abnormal results are displayed)  Labs Reviewed - No data to display ____________________________________________  EKG   ____________________________________________  RADIOLOGY Unk Pinto, personally viewed and evaluated these images (plain radiographs) as part of my medical decision making, as well as reviewing the written report by the radiologist.  DG Elbow Complete Right  Result Date: 07/15/2019 CLINICAL DATA:  Right elbow pain following fall today, initial encounter EXAM: RIGHT ELBOW - COMPLETE 3+ VIEW COMPARISON:  None. FINDINGS: There is no evidence of fracture, dislocation, or joint effusion. There is no evidence of arthropathy or other focal bone abnormality. Soft tissues are unremarkable.  IMPRESSION: No acute abnormality noted. Electronically Signed   By: Inez Catalina M.D.   On: 07/15/2019 16:52   DG Wrist Complete Right  Result Date: 07/15/2019 CLINICAL DATA:  Recent trip and fall with wrist pain and swelling, initial encounter EXAM: RIGHT WRIST - COMPLETE 3+ VIEW COMPARISON:  None. FINDINGS: Comminuted distal radial metaphyseal fracture is noted with impaction and posterior angulation at the fracture site. Small avulsion from the ulnar styloid is noted as well. Mild soft tissue swelling is seen. Degenerative changes at the first [facility] joint are noted. IMPRESSION: Distal radial and ulnar fractures as described. Electronically Signed   By: Inez Catalina M.D.   On: 07/15/2019 16:58   DG Ankle Complete Left  Result Date: 07/15/2019 CLINICAL DATA:  Recent trip and fall with left ankle pain, initial encounter EXAM: LEFT ANKLE COMPLETE - 3+ VIEW COMPARISON:  None. FINDINGS: Diffuse osteopenia is noted. No acute fracture or dislocation is noted. No soft tissue abnormality is seen. IMPRESSION: Osteopenia without acute abnormality. Electronically Signed   By: Inez Catalina M.D.   On: 07/15/2019 16:59   CT Head Wo Contrast  Result Date: 07/15/2019 CLINICAL DATA:  Trip and fall, history of stroke with left-sided deficit EXAM: CT HEAD WITHOUT CONTRAST CT CERVICAL SPINE WITHOUT CONTRAST TECHNIQUE: Multidetector CT imaging of the head and cervical spine was performed following the standard protocol without intravenous contrast. Multiplanar CT image reconstructions of the cervical spine were also generated. COMPARISON:  09/28/2018, 06/19/2004 FINDINGS: CT HEAD FINDINGS Brain: No evidence of acute infarction, hemorrhage, hydrocephalus, extra-axial collection or mass lesion/mass effect. Unchanged right MCA territory encephalomalacia. Periventricular and deep white matter hypodensity. Vascular: No hyperdense vessel or unexpected calcification. Skull: Normal. Negative for fracture or focal lesion.  Sinuses/Orbits: No acute finding. Other: None. CT CERVICAL SPINE FINDINGS Alignment: Normal. Skull base and vertebrae: No acute fracture. No primary bone lesion or focal pathologic process. Soft tissues and spinal canal: No prevertebral fluid or swelling. No visible canal hematoma. Disc levels: Moderate to severe multilevel disc space height loss and osteophytosis of the lower cervical spine. Upper chest: Negative. Other: None. IMPRESSION: 1. No acute intracranial pathology. 2. Unchanged right MCA territory encephalomalacia and small-vessel white matter disease. 3. No fracture or static subluxation of the cervical spine. 4. Moderate to severe multilevel disc space height loss  and osteophytosis of the lower cervical spine. Electronically Signed   By: Eddie Candle M.D.   On: 07/15/2019 16:35   CT Cervical Spine Wo Contrast  Result Date: 07/15/2019 CLINICAL DATA:  Trip and fall, history of stroke with left-sided deficit EXAM: CT HEAD WITHOUT CONTRAST CT CERVICAL SPINE WITHOUT CONTRAST TECHNIQUE: Multidetector CT imaging of the head and cervical spine was performed following the standard protocol without intravenous contrast. Multiplanar CT image reconstructions of the cervical spine were also generated. COMPARISON:  09/28/2018, 06/19/2004 FINDINGS: CT HEAD FINDINGS Brain: No evidence of acute infarction, hemorrhage, hydrocephalus, extra-axial collection or mass lesion/mass effect. Unchanged right MCA territory encephalomalacia. Periventricular and deep white matter hypodensity. Vascular: No hyperdense vessel or unexpected calcification. Skull: Normal. Negative for fracture or focal lesion. Sinuses/Orbits: No acute finding. Other: None. CT CERVICAL SPINE FINDINGS Alignment: Normal. Skull base and vertebrae: No acute fracture. No primary bone lesion or focal pathologic process. Soft tissues and spinal canal: No prevertebral fluid or swelling. No visible canal hematoma. Disc levels: Moderate to severe multilevel disc  space height loss and osteophytosis of the lower cervical spine. Upper chest: Negative. Other: None. IMPRESSION: 1. No acute intracranial pathology. 2. Unchanged right MCA territory encephalomalacia and small-vessel white matter disease. 3. No fracture or static subluxation of the cervical spine. 4. Moderate to severe multilevel disc space height loss and osteophytosis of the lower cervical spine. Electronically Signed   By: Eddie Candle M.D.   On: 07/15/2019 16:35    ____________________________________________    PROCEDURES  Procedure(s) performed:     Procedures     Medications  oxyCODONE-acetaminophen (PERCOCET/ROXICET) 5-325 MG per tablet 1 tablet (1 tablet Oral Given 07/15/19 1727)  ondansetron (ZOFRAN-ODT) disintegrating tablet 4 mg (4 mg Oral Given 07/15/19 1727)     ____________________________________________   INITIAL IMPRESSION / ASSESSMENT AND PLAN / ED COURSE  Pertinent labs & imaging results that were available during my care of the patient were reviewed by me and considered in my medical decision making (see chart for details).      Assessment and plan Fall 69 year old female presents to the emergency department after a mechanical, nonsyncopal fall  Patient presents to the emergency department primarily with right wrist pain and left ankle pain.  X-ray indicates an impacted fracture of the right radius and small avulsion of the ulnar styloid.  Patient was placed in a volar splint.   CT head and CT cervical spine revealed no evidence of intracranial bleed, skull fracture or C-spine fracture.   Patient was discharged with a short course of Percocet.  She was advised to follow-up with orthopedics.  Return precautions were given to return with new or worsening symptoms.  ____________________________________________  FINAL CLINICAL IMPRESSION(S) / ED DIAGNOSES  Final diagnoses:  Closed fracture of distal end of right radius, unspecified fracture morphology,  initial encounter      NEW MEDICATIONS STARTED DURING THIS VISIT:  ED Discharge Orders         Ordered    oxyCODONE-acetaminophen (PERCOCET/ROXICET) 5-325 MG tablet  Every 8 hours PRN     Discontinue  Reprint     07/15/19 1823    ondansetron (ZOFRAN ODT) 4 MG disintegrating tablet  Every 8 hours PRN     Discontinue  Reprint     07/15/19 1823              This chart was dictated using voice recognition software/Dragon. Despite best efforts to proofread, errors can occur which can change the meaning. Any  change was purely unintentional.     Lannie Fields, PA-C 07/15/19 1850    Lilia Pro., MD 07/16/19 [phone]

## 2019-07-15 NOTE — ED Notes (Signed)
Care giver [name]- [phone]

## 2019-07-15 NOTE — Discharge Instructions (Signed)
Please make follow-up appointment with Dr. Laureen Ochs can take Percocet and Zofran together for pain. Make sure that you are taking a stool softener to avoid constipation.

## 2019-07-15 NOTE — ED Notes (Signed)
Pt unable to sign for d/c due to condition. Pt verbalizes d/c teaching and has no other questions at this time.

## 2019-07-15 NOTE — ED Triage Notes (Signed)
Pt here from home via [employer]. Pt here after falling at 1130am.   Pt has hx of stroke with left side deficits, pt fell over carpet. Pt has skin tears present on R elbow, and swelling and bruising present on R forearm down to wrist, pulses 2+ with good cap refill. Pt also c/o L ankle pain. Pt has hx of footdrop on this side.   Denies hitting head/ LOC. Pt takes warfarin.  EMS VS- bp 180/100, RR 16, HR 70.

## 2019-07-16 ENCOUNTER — Telehealth: Payer: Self-pay

## 2019-07-16 NOTE — Telephone Encounter (Signed)
Left a detailed message trying to follow up on hospital visit, she had a fall and will see ortho and I wanted to make sure all of her appointments have been addressed. Beth

## 2019-07-20 ENCOUNTER — Emergency Department
Admission: EM | Admit: 2019-07-20 | Discharge: 2019-07-20 | Disposition: A | Discharge status: Home / Self Care | Payer: Medicare Other | Attending: Emergency Medicine | Admitting: Emergency Medicine

## 2019-07-20 ENCOUNTER — Encounter: Payer: Self-pay | Admitting: Emergency Medicine

## 2019-07-20 ENCOUNTER — Other Ambulatory Visit: Payer: Self-pay

## 2019-07-20 DIAGNOSIS — Y929 Unspecified place or not applicable: Secondary | ICD-10-CM | POA: Diagnosis not present

## 2019-07-20 DIAGNOSIS — F17210 Nicotine dependence, cigarettes, uncomplicated: Secondary | ICD-10-CM | POA: Insufficient documentation

## 2019-07-20 DIAGNOSIS — S20412A Abrasion of left back wall of thorax, initial encounter: Secondary | ICD-10-CM | POA: Diagnosis not present

## 2019-07-20 DIAGNOSIS — I509 Heart failure, unspecified: Secondary | ICD-10-CM | POA: Diagnosis not present

## 2019-07-20 DIAGNOSIS — Y999 Unspecified external cause status: Secondary | ICD-10-CM | POA: Insufficient documentation

## 2019-07-20 DIAGNOSIS — J449 Chronic obstructive pulmonary disease, unspecified: Secondary | ICD-10-CM | POA: Diagnosis not present

## 2019-07-20 DIAGNOSIS — Z79899 Other long term (current) drug therapy: Secondary | ICD-10-CM | POA: Diagnosis not present

## 2019-07-20 DIAGNOSIS — Y939 Activity, unspecified: Secondary | ICD-10-CM | POA: Diagnosis not present

## 2019-07-20 DIAGNOSIS — S299XXA Unspecified injury of thorax, initial encounter: Secondary | ICD-10-CM | POA: Diagnosis present

## 2019-07-20 DIAGNOSIS — I110 Hypertensive heart disease with heart failure: Secondary | ICD-10-CM | POA: Insufficient documentation

## 2019-07-20 DIAGNOSIS — W01190A Fall on same level from slipping, tripping and stumbling with subsequent striking against furniture, initial encounter: Secondary | ICD-10-CM | POA: Insufficient documentation

## 2019-07-20 MED ORDER — OXYCODONE-ACETAMINOPHEN 5-325 MG PO TABS
1.0000 | ORAL_TABLET | Freq: Once | ORAL | Status: AC
  Administered 2019-07-20: 1 via ORAL
  Filled 2019-07-20: qty 1

## 2019-07-20 MED ORDER — OXYCODONE-ACETAMINOPHEN 5-325 MG PO TABS: 1.0000 | ORAL_TABLET | Freq: Three times a day (TID) | ORAL | 0 refills | Status: DC | PRN

## 2019-07-20 NOTE — Discharge Instructions (Addendum)
You have an abrasion on your back that is very superficial.  Follow-up with your primary care provider.  You will need to call Monday and make an appointment and if any continued pain medication as needed.  You may apply ice to this area if needed for discomfort.

## 2019-07-20 NOTE — ED Notes (Signed)
Pt with small abr to back - bleeding controlled.

## 2019-07-20 NOTE — ED Provider Notes (Signed)
[facility] Emergency Department Provider Note  ____________________________________________   First MD Initiated Contact with Patient 07/20/19 1215     (approximate)  I have reviewed the triage vital signs and the nursing notes.   HISTORY  Chief Complaint Fall and Back Pain   HPI Teresa Holmes is a 69 y.o. female presents to the ED via [facility] from home with reported fall.  Patient states she tripped and fell hitting her back on the dresser.  She states that there is a scratch on her back.  Patient also currently has a cast on her right arm from a injury that was also recent.  Patient states that she no longer has any pain medication.  When asked about her back and ribs patient states that she is not worried about any fractures and does not want to have any x-rays done.  Patient rates her pain as a 6 out of 10.      Past Medical History:  Diagnosis Date  . Aneurysm ([place])   . Anxiety   . Arthritis   . Cancer ([place])   . CHF (congestive heart failure) ([place])   . COPD (chronic obstructive pulmonary disease) ([place])   . Hyperlipidemia   . Hypertension   . Stroke ([place])   . Thyroid disease     Patient Active Problem List   Diagnosis Date Noted  . Pelvic fracture ([place]) 09/28/2018  . Encounter for long-term (current) use of medications 05/11/2017  . Chronic pain disorder 05/11/2017  . Generalized anxiety disorder 03/14/2017  . Essential (primary) hypertension 03/14/2017  . Hemiplegia and hemiparesis following cerebral infarction affecting left non-dominant side ([place]) 03/14/2017  . Vitamin D deficiency, unspecified 03/14/2017  . Personal history of venous thrombosis and embolism 03/14/2017  . Congenital spondylolisthesis 03/14/2017  . Nicotine dependence, cigarettes, uncomplicated 27/04/5007  . Mixed hyperlipidemia 03/14/2017  . Long term (current) use of anticoagulants 03/14/2017  . Other intervertebral disc degeneration, thoracolumbar region 03/14/2017   . Lumbago with sciatica 03/14/2017  . Cardiac arrhythmia 03/14/2017  . Myalgia 03/14/2017  . Hypothyroidism 03/14/2017  . Difficulty walking 03/14/2017  . Left knee pain 03/14/2017    Past Surgical History:  Procedure Laterality Date  . ABDOMINAL HYSTERECTOMY    . AORTIC/RENAL BYPASS    . bypass graft    . CHOLECYSTECTOMY    . CORONARY ARTERY BYPASS GRAFT      Prior to Admission medications   Medication Sig Start Date End Date Taking? Authorizing Provider  acetaminophen (TYLENOL) 325 MG tablet Take 2 tablets (650 mg total) by mouth every 6 (six) hours as needed for mild pain (or Fever >/= 101). 10/02/18   Bettey Costa, MD  albuterol (VENTOLIN HFA) 108 (90 Base) MCG/ACT inhaler Inhale 2 puffs into the lungs every 4 (four) hours as needed for wheezing or shortness of breath. 05/24/18   Ronnell Freshwater, NP  arformoterol (BROVANA) 15 MCG/2ML NEBU Take 2 mLs (15 mcg total) by nebulization 2 (two) times daily. 07/25/18   Ronnell Freshwater, NP  atorvastatin (LIPITOR) 40 MG tablet Take 1 tablet by mouth once daily 07/04/19   Kendell Bane, NP  fenofibrate (TRICOR) 145 MG tablet Take 1 tablet by mouth once daily 07/04/19   Kendell Bane, NP  ferrous sulfate (FERROUSUL) 325 (65 FE) MG tablet Take 1 tablet (325 mg total) by mouth daily with breakfast. 10/02/18   Bettey Costa, MD  furosemide (LASIX) 20 MG tablet 20 MG FOR 3 DAYS THEN ONE EVERY  OTHER DAY 04/13/19   Ronnell Freshwater, NP  guaiFENesin (MUCINEX) 600 MG 12 hr tablet Take 1 mg by mouth every morning.    [provider]  HYDROcodone-acetaminophen (NORCO) 10-325 MG tablet Take half tab bid prn for acute and severe hip pain 06/22/19   Ronnell Freshwater, NP  levothyroxine (SYNTHROID) 50 MCG tablet Take 1 tablet (50 mcg total) by mouth daily before breakfast. 06/20/19   Scarboro, Audie Clear, NP  lidocaine (LIDODERM) 5 % Place 1 patch onto the skin daily. Remove & Discard patch within 12 hours or as directed by MD 10/02/18   Bettey Costa, MD    metoprolol tartrate (LOPRESSOR) 25 MG tablet Take 0.5 tablets (12.5 mg total) by mouth 2 (two) times daily. 12/19/18   Lavera Guise, MD  ondansetron (ZOFRAN ODT) 4 MG disintegrating tablet Take 1 tablet (4 mg total) by mouth every 8 (eight) hours as needed for up to 5 days for nausea or vomiting. 07/15/19 07/20/19  Lannie Fields, PA-C  oxyCODONE-acetaminophen (PERCOCET/ROXICET) 5-325 MG tablet Take 1 tablet by mouth every 8 (eight) hours as needed for moderate pain. 07/20/19   Johnn Hai, PA-C  warfarin (COUMADIN) 2 MG tablet Take 2 mg by mouth daily.    [provider]    Allergies Aspirin and Sulfa antibiotics  Family History  Problem Relation Age of Onset  . Cancer Mother     Social History Social History   Tobacco Use  . Smoking status: Current Every Day Smoker    Types: Cigarettes  . Smokeless tobacco: Never Used  Vaping Use  . Vaping Use: Never used  Substance Use Topics  . Alcohol use: No  . Drug use: Never    Review of Systems Constitutional: No fever/chills Eyes: No visual changes. Cardiovascular: Denies chest pain. Respiratory: Denies shortness of breath. Gastrointestinal: No abdominal pain.  No nausea, no vomiting.  Musculoskeletal: Positive back pain, positive right forearm pain and fracture. Skin: Positive abrasion. Neurological: Negative for headaches, focal weakness or numbness.   ____________________________________________   PHYSICAL EXAM:  VITAL SIGNS: ED Triage Vitals  Enc Vitals Group     BP 07/20/19 1108 (!) 192/91     Pulse Rate 07/20/19 1108 85     Resp 07/20/19 1108 20     Temp 07/20/19 1108 98.6 F (37 C)     Temp Source 07/20/19 1108 Oral     SpO2 07/20/19 1108 97 %     Weight 07/20/19 1109 100 lb (45.4 kg)     Height 07/20/19 1109 4\' 7"  (1.397 m)     Head Circumference --      Peak Flow --      Pain Score 07/20/19 1108 6     Pain Loc --      Pain Edu? --      Excl. in [place]? --     Constitutional: Alert and  oriented. Well appearing and in no acute distress. Eyes: Conjunctivae are normal.  Head: Atraumatic. Neck: No stridor.  No cervical tenderness on palpation posteriorly. Cardiovascular: Normal rate, regular rhythm. Grossly normal heart sounds.  Good peripheral circulation. Respiratory: Normal respiratory effort.  No retractions. Lungs CTAB. Gastrointestinal: Soft and nontender. No distention.  Musculoskeletal: Patient is able to move both upper and lower extremities without any difficulty.  On palpation of thoracic and lumbar spine patient denies any pain.  There is a very superficial single abrasion noted to the left thoracic area without active bleeding. Neurologic:  Normal speech and  language. No gross focal neurologic deficits are appreciated. No gait instability. Skin:  Skin is warm, dry.  Abrasion as noted above. Psychiatric: Mood and affect are normal. Speech and behavior are normal.  ____________________________________________   LABS (all labs ordered are listed, but only abnormal results are displayed)  Labs Reviewed - No data to display  PROCEDURES  Procedure(s) performed (including Critical Care):  Procedures   ____________________________________________   INITIAL IMPRESSION / ASSESSMENT AND PLAN / ED COURSE  As part of my medical decision making, I reviewed the following data within the electronic MEDICAL RECORD NUMBER Notes from prior ED visits and [place] Controlled Substance Database  69 year old female presents to the ED via EMS after she tripped and fell hitting her back on the dresser at home.  Patient was made aware there is a small abrasion noted to her back but she denies any tenderness on palpation of thoracic or lumbar spine.  She does not want to have any imaging done.  Patient is currently being seen for fracture to her right forearm.  Patient was given Percocet while in the ED and a prescription for a small amount to her pharmacy.  She was made aware that she would  need to follow-up with her orthopedist or her PCP if any additional pain medication is needed.  ____________________________________________   FINAL CLINICAL IMPRESSION(S) / ED DIAGNOSES  Final diagnoses:  Abrasion of left side of back, initial encounter     ED Discharge Orders         Ordered    oxyCODONE-acetaminophen (PERCOCET/ROXICET) 5-325 MG tablet  Every 8 hours PRN     Discontinue  Reprint     07/20/19 1307           Note:  This document was prepared using Dragon voice recognition software and may include unintentional dictation errors.    Johnn Hai, PA-C 07/20/19 1707    Duffy Bruce, MD 07/23/19 [phone]

## 2019-07-20 NOTE — ED Triage Notes (Signed)
Pt to ED via [facility] from home for fall. Pt states that she tripped and fell hitting her back on the dresser. Pt denies LOC or hitting her head. Pt is only c/o back pain where she hit it on the dresser. Pt is in NAD.

## 2019-07-20 NOTE — ED Triage Notes (Signed)
Pt in via [facility] from home with c/o fall. Pt lost balance and fell into dresser. Pt with hematoma to left ribs and small abrasion. Pt c/o back pain. Pt not able to move left side due to a previous stroke. Pt refused collar. Pt on coumadin. Pt also has broken right hand.

## 2019-07-23 ENCOUNTER — Other Ambulatory Visit: Payer: Self-pay

## 2019-07-23 DIAGNOSIS — E039 Hypothyroidism, unspecified: Secondary | ICD-10-CM

## 2019-07-23 MED ORDER — LEVOTHYROXINE SODIUM 50 MCG PO TABS: 50.0000 ug | ORAL_TABLET | Freq: Every day | ORAL | 3 refills | Status: DC

## 2019-07-24 ENCOUNTER — Telehealth: Payer: Self-pay

## 2019-07-24 NOTE — Telephone Encounter (Signed)
Patient did not feel need in scheduling hospital follow up and will see ortho; [facility]

## 2019-08-02 ENCOUNTER — Other Ambulatory Visit: Payer: Self-pay

## 2019-08-02 MED ORDER — FUROSEMIDE 20 MG PO TABS: ORAL_TABLET | ORAL | 1 refills | Status: DC

## 2019-08-13 ENCOUNTER — Telehealth: Payer: Self-pay

## 2019-08-13 NOTE — Telephone Encounter (Signed)
Patient's jury excuse letter is signed and ready for pick up. Beth

## 2019-08-15 ENCOUNTER — Telehealth: Payer: Self-pay

## 2019-08-15 NOTE — Telephone Encounter (Signed)
Confirmed and screened for 08-17-19 ov. 

## 2019-08-17 ENCOUNTER — Ambulatory Visit: Payer: Medicare Other | Admitting: Nurse Practitioner

## 2019-08-20 ENCOUNTER — Ambulatory Visit: Payer: Medicare Other | Admitting: Nurse Practitioner

## 2019-08-23 ENCOUNTER — Ambulatory Visit: Payer: Medicare Other | Admitting: Nurse Practitioner

## 2019-08-29 ENCOUNTER — Telehealth: Payer: Self-pay

## 2019-08-29 ENCOUNTER — Other Ambulatory Visit: Payer: Self-pay | Admitting: Nurse Practitioner

## 2019-08-29 DIAGNOSIS — G894 Chronic pain syndrome: Secondary | ICD-10-CM

## 2019-08-29 MED ORDER — HYDROCODONE-ACETAMINOPHEN 10-325 MG PO TABS: ORAL_TABLET | ORAL | 0 refills | Status: DC

## 2019-08-29 NOTE — Telephone Encounter (Signed)
I sent single prescription for #30 tablets to [facility] [address]. She sees adam for follow up 09/06/2019

## 2019-08-29 NOTE — Telephone Encounter (Signed)
Informed caregiver that prescription was sent to pharmacy.  dbs

## 2019-09-04 ENCOUNTER — Telehealth: Payer: Self-pay

## 2019-09-04 NOTE — Telephone Encounter (Signed)
Confirmed and screened for 09-06-19 ov.

## 2019-09-06 ENCOUNTER — Ambulatory Visit: Payer: Medicare Other | Admitting: Adult Health

## 2019-09-06 ENCOUNTER — Telehealth: Payer: Self-pay

## 2019-09-06 ENCOUNTER — Ambulatory Visit: Payer: Medicare Other | Admitting: Nurse Practitioner

## 2019-09-06 NOTE — Telephone Encounter (Signed)
Confirmed and screened for office visit 8/9 

## 2019-09-10 ENCOUNTER — Ambulatory Visit (INDEPENDENT_AMBULATORY_CARE_PROVIDER_SITE_OTHER): Payer: Medicare Other | Admitting: Adult Health

## 2019-09-10 ENCOUNTER — Encounter: Payer: Self-pay | Admitting: Adult Health

## 2019-09-10 ENCOUNTER — Other Ambulatory Visit: Payer: Self-pay

## 2019-09-10 VITALS — BP 179/79 | HR 61 | Temp 97.2°F | Resp 16 | Ht <= 58 in | Wt 89.0 lb

## 2019-09-10 DIAGNOSIS — Z0001 Encounter for general adult medical examination with abnormal findings: Secondary | ICD-10-CM | POA: Diagnosis not present

## 2019-09-10 DIAGNOSIS — G894 Chronic pain syndrome: Secondary | ICD-10-CM | POA: Diagnosis not present

## 2019-09-10 DIAGNOSIS — I4891 Unspecified atrial fibrillation: Secondary | ICD-10-CM | POA: Diagnosis not present

## 2019-09-10 DIAGNOSIS — I480 Paroxysmal atrial fibrillation: Secondary | ICD-10-CM | POA: Diagnosis not present

## 2019-09-10 DIAGNOSIS — I10 Essential (primary) hypertension: Secondary | ICD-10-CM | POA: Diagnosis not present

## 2019-09-10 DIAGNOSIS — J439 Emphysema, unspecified: Secondary | ICD-10-CM | POA: Diagnosis not present

## 2019-09-10 DIAGNOSIS — F17210 Nicotine dependence, cigarettes, uncomplicated: Secondary | ICD-10-CM | POA: Diagnosis not present

## 2019-09-10 DIAGNOSIS — F411 Generalized anxiety disorder: Secondary | ICD-10-CM

## 2019-09-10 DIAGNOSIS — Z79899 Other long term (current) drug therapy: Secondary | ICD-10-CM | POA: Diagnosis not present

## 2019-09-10 DIAGNOSIS — E039 Hypothyroidism, unspecified: Secondary | ICD-10-CM

## 2019-09-10 DIAGNOSIS — E782 Mixed hyperlipidemia: Secondary | ICD-10-CM

## 2019-09-10 DIAGNOSIS — Z7901 Long term (current) use of anticoagulants: Secondary | ICD-10-CM | POA: Diagnosis not present

## 2019-09-10 DIAGNOSIS — I69354 Hemiplegia and hemiparesis following cerebral infarction affecting left non-dominant side: Secondary | ICD-10-CM | POA: Diagnosis not present

## 2019-09-10 MED ORDER — FENOFIBRATE 145 MG PO TABS: 145.0000 mg | ORAL_TABLET | Freq: Every day | ORAL | 3 refills | Status: DC

## 2019-09-10 MED ORDER — LEVOTHYROXINE SODIUM 50 MCG PO TABS: 50.0000 ug | ORAL_TABLET | Freq: Every day | ORAL | 3 refills | Status: DC

## 2019-09-10 MED ORDER — ATORVASTATIN CALCIUM 40 MG PO TABS: 40.0000 mg | ORAL_TABLET | Freq: Every day | ORAL | 3 refills | Status: DC

## 2019-09-10 MED ORDER — HYDROCODONE-ACETAMINOPHEN 10-325 MG PO TABS: ORAL_TABLET | ORAL | 0 refills | Status: DC

## 2019-09-10 NOTE — Progress Notes (Signed)
[facility] [place], [place] [address]  Internal MEDICINE  Office Visit Note  Patient Name: Teresa Holmes  [id number]  [id number]  Date of Service: 09/10/2019  Chief Complaint  Patient presents with  . Medicare Wellness    pt fell thursday last week, pt's tail bone is tender  . Hyperlipidemia  . Hypotension  . Quality Metric Gaps    Tdap, HIV screening     HPI Pt is here for routine health maintenance examination. She is a frail appearing 69 you female.  She has a history of HLD, HTN, hypothyroid, a fib, chronic pain and nicotine dependence.  Overall she is in her usual state of health.  She does reports a fall from standing 4 days ago.  She was trying to get to the bedside commode and fell on her face.  She has a small laceration on her nose, and has significant bruising across her nose and under her eyes.  She denies LOC at that time. Her bp is slightly elevated today.  Her A-fib, HLD and thyroid are controlled currently.    Current Medication: Outpatient Encounter Medications as of 09/10/2019  Medication Sig  . acetaminophen (TYLENOL) 325 MG tablet Take 2 tablets (650 mg total) by mouth every 6 (six) hours as needed for mild pain (or Fever >/= 101).  Marland Kitchen albuterol (VENTOLIN HFA) 108 (90 Base) MCG/ACT inhaler Inhale 2 puffs into the lungs every 4 (four) hours as needed for wheezing or shortness of breath.  Marland Kitchen arformoterol (BROVANA) 15 MCG/2ML NEBU Take 2 mLs (15 mcg total) by nebulization 2 (two) times daily.  . ferrous sulfate (FERROUSUL) 325 (65 FE) MG tablet Take 1 tablet (325 mg total) by mouth daily with breakfast.  . furosemide (LASIX) 20 MG tablet 20 MG FOR 3 DAYS THEN ONE EVERY OTHER DAY  . guaiFENesin (MUCINEX) 600 MG 12 hr tablet Take 1 mg by mouth every morning.  . lidocaine (LIDODERM) 5 % Place 1 patch onto the skin daily. Remove & Discard patch within 12 hours or as directed by MD  . metoprolol tartrate (LOPRESSOR) 25 MG tablet Take 0.5 tablets  (12.5 mg total) by mouth 2 (two) times daily.  Marland Kitchen oxyCODONE-acetaminophen (PERCOCET/ROXICET) 5-325 MG tablet Take 1 tablet by mouth every 8 (eight) hours as needed for moderate pain.  Marland Kitchen warfarin (COUMADIN) 2 MG tablet Take 2 mg by mouth daily.  . [DISCONTINUED] atorvastatin (LIPITOR) 40 MG tablet Take 1 tablet by mouth once daily  . [DISCONTINUED] fenofibrate (TRICOR) 145 MG tablet Take 1 tablet by mouth once daily  . [DISCONTINUED] HYDROcodone-acetaminophen (NORCO) 10-325 MG tablet Take half tab bid prn for acute and severe hip pain  . [DISCONTINUED] levothyroxine (SYNTHROID) 50 MCG tablet Take 1 tablet (50 mcg total) by mouth daily before breakfast.  . atorvastatin (LIPITOR) 40 MG tablet Take 1 tablet (40 mg total) by mouth daily.  . fenofibrate (TRICOR) 145 MG tablet Take 1 tablet (145 mg total) by mouth daily.  Derrill Memo ON 09/14/2019] HYDROcodone-acetaminophen (NORCO) 10-325 MG tablet Take half tab bid prn for acute and severe hip pain  . levothyroxine (SYNTHROID) 50 MCG tablet Take 1 tablet (50 mcg total) by mouth daily before breakfast.   No facility-administered encounter medications on file as of 09/10/2019.    Surgical History: Past Surgical History:  Procedure Laterality Date  . ABDOMINAL HYSTERECTOMY    . AORTIC/RENAL BYPASS    . bypass graft    . CHOLECYSTECTOMY    . CORONARY ARTERY  BYPASS GRAFT      Medical History: Past Medical History:  Diagnosis Date  . Aneurysm ([place])   . Anxiety   . Arthritis   . Cancer ([place])   . CHF (congestive heart failure) ([place])   . COPD (chronic obstructive pulmonary disease) ([place])   . Hyperlipidemia   . Hypertension   . Stroke ([place])   . Thyroid disease     Family History: Family History  Problem Relation Age of Onset  . Cancer Mother       Review of Systems  Constitutional: Negative for chills, fatigue and unexpected weight change.  HENT: Negative for congestion, rhinorrhea, sneezing and sore throat.   Eyes: Negative for  photophobia, pain and redness.  Respiratory: Negative for cough, chest tightness and shortness of breath.   Cardiovascular: Negative for chest pain and palpitations.  Gastrointestinal: Negative for abdominal pain, constipation, diarrhea, nausea and vomiting.  Endocrine: Negative.   Genitourinary: Negative for dysuria and frequency.  Musculoskeletal: Negative for arthralgias, back pain, joint swelling and neck pain.  Skin: Negative for rash.  Allergic/Immunologic: Negative.   Neurological: Negative for tremors and numbness.  Hematological: Negative for adenopathy. Does not bruise/bleed easily.  Psychiatric/Behavioral: Negative for behavioral problems and sleep disturbance. The patient is not nervous/anxious.      Vital Signs: BP (!) 179/79   Pulse 61   Temp (!) 97.2 F (36.2 C)   Resp 16   Ht 4\' 9"  (1.448 m)   Wt 89 lb (40.4 kg)   SpO2 93%   BMI 19.26 kg/m    Physical Exam Vitals and nursing note reviewed.  Constitutional:      General: She is not in acute distress.    Appearance: She is well-developed. She is not diaphoretic.  HENT:     Head: Normocephalic and atraumatic.     Mouth/Throat:     Pharynx: No oropharyngeal exudate.  Eyes:     Pupils: Pupils are equal, round, and reactive to light.  Neck:     Thyroid: No thyromegaly.     Vascular: No JVD.     Trachea: No tracheal deviation.  Cardiovascular:     Rate and Rhythm: Normal rate and regular rhythm.     Heart sounds: Normal heart sounds. No murmur heard.  No friction rub. No gallop.   Pulmonary:     Effort: Pulmonary effort is normal. No respiratory distress.     Breath sounds: Wheezing and rhonchi present. No rales.  Chest:     Chest wall: No tenderness.  Abdominal:     Palpations: Abdomen is soft.     Tenderness: There is no abdominal tenderness. There is no guarding.  Musculoskeletal:        General: Normal range of motion.     Cervical back: Normal range of motion and neck supple.  Lymphadenopathy:      Cervical: No cervical adenopathy.  Skin:    General: Skin is warm and dry.  Neurological:     Mental Status: She is alert and oriented to person, place, and time.     Cranial Nerves: No cranial nerve deficit.  Psychiatric:        Behavior: Behavior normal.        Thought Content: Thought content normal.        Judgment: Judgment normal.     LABS: No results found for this or any previous visit (from the past 2160 hour(s)).    Assessment/Plan: 1. Encounter for general adult medical examination with abnormal findings  up to date on PHM  2. Pulmonary emphysema, unspecified emphysema type ([place]) Stable, continue current mgmt  3. Paroxysmal atrial fibrillation (HCC) Continue to monitor, and follow up with cardiology as directed.   4. Essential (primary) hypertension Slightly elevated today, continue with meds.  If no improvement in next 24 hours return to office.   5. Chronic pain disorder Reviewed risks and possible side effects associated with taking opiates, benzodiazepines and other CNS depressants. Combination of these could cause dizziness and drowsiness. Advised patient not to drive or operate machinery when taking these medications, as patient's and other's life can be at risk and will have consequences. Patient verbalized understanding in this matter. Dependence and abuse for these drugs will be monitored closely. A Controlled substance policy and procedure is on file which allows [facility] to order a urine drug screen test at any visit. Patient understands and agrees with the plan - HYDROcodone-acetaminophen (NORCO) 10-325 MG tablet; Take half tab bid prn for acute and severe hip pain  Dispense: 30 tablet; Refill: 0  6. Nicotine dependence, cigarettes, uncomplicated Smoking cessation counseling: 1. Pt acknowledges the risks of long term smoking, she will try to quite smoking. 2. Options for different medications including nicotine products, chewing gum,  patch etc, Wellbutrin and Chantix is discussed 3. Goal and date of compete cessation is discussed 4. Total time spent in smoking cessation is 15 min.  7. Atrial fibrillation, unspecified type ([place]) No recent issues, continue to monitor.   8. Mixed hyperlipidemia Continue Tricor and lipitor as prescribed.  - fenofibrate (TRICOR) 145 MG tablet; Take 1 tablet (145 mg total) by mouth daily.  Dispense: 30 tablet; Refill: 3 - atorvastatin (LIPITOR) 40 MG tablet; Take 1 tablet (40 mg total) by mouth daily.  Dispense: 30 tablet; Refill: 3  9. Acquired hypothyroidism Continue to use synthroid at current dose.  - levothyroxine (SYNTHROID) 50 MCG tablet; Take 1 tablet (50 mcg total) by mouth daily before breakfast.  Dispense: 30 tablet; Refill: 3  10. Generalized anxiety disorder Controlled current, continue present mgmt.  11. Hemiplegia and hemiparesis following cerebral infarction affecting left non-dominant side (HCC) residual findings on exam from CVA.   12. Long term (current) use of anticoagulants Continue as before.   General Counseling: Linda verbalizes understanding of the findings of todays visit and agrees with plan of treatment. I have discussed any further diagnostic evaluation that may be needed or ordered today. We also reviewed her medications today. she has been encouraged to call the office with any questions or concerns that should arise related to todays visit.   No orders of the defined types were placed in this encounter.   Meds ordered this encounter  Medications  . levothyroxine (SYNTHROID) 50 MCG tablet    Sig: Take 1 tablet (50 mcg total) by mouth daily before breakfast.    Dispense:  30 tablet    Refill:  3  . fenofibrate (TRICOR) 145 MG tablet    Sig: Take 1 tablet (145 mg total) by mouth daily.    Dispense:  30 tablet    Refill:  3  . atorvastatin (LIPITOR) 40 MG tablet    Sig: Take 1 tablet (40 mg total) by mouth daily.    Dispense:  30 tablet    Refill:   3  . HYDROcodone-acetaminophen (NORCO) 10-325 MG tablet    Sig: Take half tab bid prn for acute and severe hip pain    Dispense:  30 tablet    Refill:  0    Do not fill before 09/14/2019    Time spent: 25 Minutes   This patient was seen by Orson Gear AGNP-C in Collaboration with Dr Lavera Guise as a part of collaborative care agreement    Kendell Bane AGNP-C Internal Medicine

## 2019-09-11 DIAGNOSIS — S52551D Other extraarticular fracture of lower end of right radius, subsequent encounter for closed fracture with routine healing: Secondary | ICD-10-CM | POA: Diagnosis not present

## 2019-09-20 ENCOUNTER — Other Ambulatory Visit: Payer: Self-pay

## 2019-09-20 MED ORDER — METOPROLOL TARTRATE 25 MG PO TABS: 12.5000 mg | ORAL_TABLET | Freq: Two times a day (BID) | ORAL | 2 refills | Status: DC

## 2019-10-09 ENCOUNTER — Other Ambulatory Visit: Payer: Self-pay

## 2019-10-09 DIAGNOSIS — E039 Hypothyroidism, unspecified: Secondary | ICD-10-CM

## 2019-10-09 MED ORDER — LEVOTHYROXINE SODIUM 50 MCG PO TABS: 50.0000 ug | ORAL_TABLET | Freq: Every day | ORAL | 3 refills | Status: DC

## 2019-10-15 ENCOUNTER — Ambulatory Visit: Payer: Medicare Other | Admitting: Nurse Practitioner

## 2019-10-16 ENCOUNTER — Telehealth: Payer: Self-pay

## 2019-10-16 NOTE — Telephone Encounter (Signed)
BILLED MISSED APPT FEE 10/15/19

## 2019-10-23 ENCOUNTER — Ambulatory Visit (INDEPENDENT_AMBULATORY_CARE_PROVIDER_SITE_OTHER): Payer: Medicare Other | Admitting: Internal Medicine

## 2019-10-23 ENCOUNTER — Encounter: Payer: Self-pay | Admitting: Internal Medicine

## 2019-10-23 DIAGNOSIS — J439 Emphysema, unspecified: Secondary | ICD-10-CM | POA: Diagnosis not present

## 2019-10-23 DIAGNOSIS — W19XXXS Unspecified fall, sequela: Secondary | ICD-10-CM | POA: Diagnosis not present

## 2019-10-23 DIAGNOSIS — G894 Chronic pain syndrome: Secondary | ICD-10-CM | POA: Diagnosis not present

## 2019-10-23 DIAGNOSIS — I69354 Hemiplegia and hemiparesis following cerebral infarction affecting left non-dominant side: Secondary | ICD-10-CM

## 2019-10-23 MED ORDER — HYDROCODONE-ACETAMINOPHEN 10-325 MG PO TABS: ORAL_TABLET | ORAL | 0 refills | Status: DC

## 2019-10-23 NOTE — Progress Notes (Signed)
[facility] [place], [place] [address]  Internal MEDICINE  Telephone Visit  Patient Name: Teresa Holmes  [id number]  [id number]  Date of Service: 10/27/2019  I connected with the patient at 115 by telephone and verified the patients identity using two identifiers.   I discussed the limitations, risks, security and privacy concerns of performing an evaluation and management service by telephone and the availability of in person appointments. I also discussed with the patient that there may be a patient responsible charge related to the service.  The patient expressed understanding and agrees to proceed.    Chief Complaint  Patient presents with  . Telephone Screen    [phone]  . Telephone Assessment  . Tailbone Pain    fell out of bed  . Medication Refill    HPI Pt is connected via virtual visit. Pt continues to suffer from chronic pain due to fx of hip and wrist . She has a history of stroke, affecting her left side she is has chronic weakness in the left upper extremity. Is right-hand dominant and uses her right hand for all activities of daily living. She denies any numbness or tingling. She is not having any pain or discomfort. She lives at home with help of CNA for 12 to 14 hours a day. She uses a cane to ambulate. She is been able to use her right upper extremity to hold her cane and is doing well with no pain or discomfort. sustained a fall with nondisplaced fracture of inferior pubic ramus on the left along with rib fx on the left. She did not require surgery, did have a hematoma as well. Pt wants pain control so she can stay active. Does have left sided weakness due to CVA.  Current Medication: Outpatient Encounter Medications as of 10/23/2019  Medication Sig  . acetaminophen (TYLENOL) 325 MG tablet Take 2 tablets (650 mg total) by mouth every 6 (six) hours as needed for mild pain (or Fever >/= 101).  Marland Kitchen albuterol (VENTOLIN HFA) 108 (90 Base)  MCG/ACT inhaler Inhale 2 puffs into the lungs every 4 (four) hours as needed for wheezing or shortness of breath.  Marland Kitchen arformoterol (BROVANA) 15 MCG/2ML NEBU Take 2 mLs (15 mcg total) by nebulization 2 (two) times daily.  Marland Kitchen atorvastatin (LIPITOR) 40 MG tablet Take 1 tablet (40 mg total) by mouth daily.  . fenofibrate (TRICOR) 145 MG tablet Take 1 tablet (145 mg total) by mouth daily.  . ferrous sulfate (FERROUSUL) 325 (65 FE) MG tablet Take 1 tablet (325 mg total) by mouth daily with breakfast.  . furosemide (LASIX) 20 MG tablet 20 MG FOR 3 DAYS THEN ONE EVERY OTHER DAY  . guaiFENesin (MUCINEX) 600 MG 12 hr tablet Take 1 mg by mouth every morning.  Marland Kitchen HYDROcodone-acetaminophen (NORCO) 10-325 MG tablet Take half tab bid prn for acute and severe hip pain  . levothyroxine (SYNTHROID) 50 MCG tablet Take 1 tablet (50 mcg total) by mouth daily before breakfast.  . lidocaine (LIDODERM) 5 % Place 1 patch onto the skin daily. Remove & Discard patch within 12 hours or as directed by MD  . metoprolol tartrate (LOPRESSOR) 25 MG tablet Take 0.5 tablets (12.5 mg total) by mouth 2 (two) times daily.  Marland Kitchen warfarin (COUMADIN) 2 MG tablet Take 2 mg by mouth daily.  . [DISCONTINUED] HYDROcodone-acetaminophen (NORCO) 10-325 MG tablet Take half tab bid prn for acute and severe hip pain  . [DISCONTINUED] oxyCODONE-acetaminophen (PERCOCET/ROXICET) 5-325 MG tablet Take  1 tablet by mouth every 8 (eight) hours as needed for moderate pain.   No facility-administered encounter medications on file as of 10/23/2019.    Surgical History: Past Surgical History:  Procedure Laterality Date  . ABDOMINAL HYSTERECTOMY    . AORTIC/RENAL BYPASS    . bypass graft    . CHOLECYSTECTOMY    . CORONARY ARTERY BYPASS GRAFT      Medical History: Past Medical History:  Diagnosis Date  . Aneurysm ([place])   . Anxiety   . Arthritis   . Cancer ([place])   . CHF (congestive heart failure) ([place])   . COPD (chronic obstructive pulmonary disease)  ([place])   . Hyperlipidemia   . Hypertension   . Stroke ([place])   . Thyroid disease     Family History: Family History  Problem Relation Age of Onset  . Cancer Mother     Social History   Socioeconomic History  . Marital status: Married    Spouse name: Not on file  . Number of children: Not on file  . Years of education: Not on file  . Highest education level: Not on file  Occupational History  . Not on file  Tobacco Use  . Smoking status: Current Every Day Smoker    Types: Cigarettes  . Smokeless tobacco: Never Used  Vaping Use  . Vaping Use: Never used  Substance and Sexual Activity  . Alcohol use: No  . Drug use: Never  . Sexual activity: Not on file  Other Topics Concern  . Not on file  Social History Narrative  . Not on file   Social Determinants of Health   Financial Resource Strain:   . Difficulty of Paying Living Expenses: Not on file  Food Insecurity:   . Worried About Charity fundraiser in the Last Year: Not on file  . Ran Out of Food in the Last Year: Not on file  Transportation Needs:   . Lack of Transportation (Medical): Not on file  . Lack of Transportation (Non-Medical): Not on file  Physical Activity:   . Days of Exercise per Week: Not on file  . Minutes of Exercise per Session: Not on file  Stress:   . Feeling of Stress : Not on file  Social Connections:   . Frequency of Communication with Friends and Family: Not on file  . Frequency of Social Gatherings with Friends and Family: Not on file  . Attends Religious Services: Not on file  . Active Member of Clubs or Organizations: Not on file  . Attends Archivist Meetings: Not on file  . Marital Status: Not on file  Intimate Partner Violence:   . Fear of Current or Ex-Partner: Not on file  . Emotionally Abused: Not on file  . Physically Abused: Not on file  . Sexually Abused: Not on file  Review of Systems  Constitutional: Positive for weight loss.  HENT: Negative for congestion  and ear discharge.   Respiratory: Negative for sputum production and shortness of breath.   Cardiovascular: Negative for chest pain and palpitations.  Musculoskeletal: Positive for falls and joint pain. Negative for myalgias.  Neurological: Positive for tingling and weakness.     Vital Signs: BP 118/70   Temp 98.2 F (36.8 C)   Resp 16   Ht 4\' 9"  (1.448 m)   Wt 82 lb (37.2 kg)   BMI 17.74 kg/m    Observation/Objective: Pt is connected and able to communicate well, unable to walk, needs assistance  with ADL, has caregiver at home   Assessment/Plan: 1. Hemiplegia and hemiparesis following cerebral infarction affecting left non-dominant side (HCC) Continues to have difficulty walking, recent fall in June 2021 with closed wrist fx.   2. Chronic pain disorder Continue limited use of narcotics,  - HYDROcodone-acetaminophen (NORCO) 10-325 MG tablet; Take half tab bid prn for acute and severe hip pain  Dispense: 30 tablet; Refill: 0 PDMP reviewed   3. Pulmonary emphysema, unspecified emphysema type ([place]) Continue neb at home  4. Fall, sequela Fall precautions addressed with caregiver   General Counseling: Ciaira verbalizes understanding of the findings of today's phone visit and agrees with plan of treatment. I have discussed any further diagnostic evaluation that may be needed or ordered today. We also reviewed her medications today. she has been encouraged to call the office with any questions or concerns that should arise related to todays visit.   Meds ordered this encounter  Medications  . HYDROcodone-acetaminophen (NORCO) 10-325 MG tablet    Sig: Take half tab bid prn for acute and severe hip pain    Dispense:  30 tablet    Refill:  0    Time spent:30 Minutes    Dr Lavera Guise Internal medicine

## 2019-11-10 ENCOUNTER — Other Ambulatory Visit: Payer: Self-pay

## 2019-11-10 ENCOUNTER — Emergency Department: Payer: Medicare Other

## 2019-11-10 ENCOUNTER — Inpatient Hospital Stay
Admission: EM | Admit: 2019-11-10 | Discharge: 2019-11-15 | DRG: 522 | Disposition: A | Discharge status: Skilled Nursing Facility | Payer: Medicare Other | Attending: Internal Medicine | Admitting: Internal Medicine

## 2019-11-10 DIAGNOSIS — W010XXA Fall on same level from slipping, tripping and stumbling without subsequent striking against object, initial encounter: Secondary | ICD-10-CM | POA: Diagnosis present

## 2019-11-10 DIAGNOSIS — S72012A Unspecified intracapsular fracture of left femur, initial encounter for closed fracture: Principal | ICD-10-CM | POA: Diagnosis present

## 2019-11-10 DIAGNOSIS — D649 Anemia, unspecified: Secondary | ICD-10-CM

## 2019-11-10 DIAGNOSIS — Z471 Aftercare following joint replacement surgery: Secondary | ICD-10-CM | POA: Diagnosis not present

## 2019-11-10 DIAGNOSIS — S2243XA Multiple fractures of ribs, bilateral, initial encounter for closed fracture: Secondary | ICD-10-CM | POA: Diagnosis not present

## 2019-11-10 DIAGNOSIS — Z7901 Long term (current) use of anticoagulants: Secondary | ICD-10-CM

## 2019-11-10 DIAGNOSIS — M6281 Muscle weakness (generalized): Secondary | ICD-10-CM | POA: Diagnosis not present

## 2019-11-10 DIAGNOSIS — J440 Chronic obstructive pulmonary disease with acute lower respiratory infection: Secondary | ICD-10-CM | POA: Diagnosis not present

## 2019-11-10 DIAGNOSIS — Z90710 Acquired absence of both cervix and uterus: Secondary | ICD-10-CM

## 2019-11-10 DIAGNOSIS — I69354 Hemiplegia and hemiparesis following cerebral infarction affecting left non-dominant side: Secondary | ICD-10-CM

## 2019-11-10 DIAGNOSIS — M1612 Unilateral primary osteoarthritis, left hip: Secondary | ICD-10-CM | POA: Diagnosis not present

## 2019-11-10 DIAGNOSIS — S72112A Displaced fracture of greater trochanter of left femur, initial encounter for closed fracture: Secondary | ICD-10-CM | POA: Diagnosis not present

## 2019-11-10 DIAGNOSIS — I959 Hypotension, unspecified: Secondary | ICD-10-CM | POA: Diagnosis present

## 2019-11-10 DIAGNOSIS — I6389 Other cerebral infarction: Secondary | ICD-10-CM | POA: Diagnosis not present

## 2019-11-10 DIAGNOSIS — S40022A Contusion of left upper arm, initial encounter: Secondary | ICD-10-CM | POA: Diagnosis present

## 2019-11-10 DIAGNOSIS — E785 Hyperlipidemia, unspecified: Secondary | ICD-10-CM | POA: Diagnosis present

## 2019-11-10 DIAGNOSIS — F17210 Nicotine dependence, cigarettes, uncomplicated: Secondary | ICD-10-CM | POA: Diagnosis present

## 2019-11-10 DIAGNOSIS — R2681 Unsteadiness on feet: Secondary | ICD-10-CM | POA: Diagnosis not present

## 2019-11-10 DIAGNOSIS — S41112A Laceration without foreign body of left upper arm, initial encounter: Secondary | ICD-10-CM | POA: Diagnosis present

## 2019-11-10 DIAGNOSIS — Z79890 Hormone replacement therapy (postmenopausal): Secondary | ICD-10-CM | POA: Diagnosis not present

## 2019-11-10 DIAGNOSIS — T148XXA Other injury of unspecified body region, initial encounter: Secondary | ICD-10-CM

## 2019-11-10 DIAGNOSIS — R0902 Hypoxemia: Secondary | ICD-10-CM | POA: Diagnosis not present

## 2019-11-10 DIAGNOSIS — Z96649 Presence of unspecified artificial hip joint: Secondary | ICD-10-CM

## 2019-11-10 DIAGNOSIS — Y92013 Bedroom of single-family (private) house as the place of occurrence of the external cause: Secondary | ICD-10-CM | POA: Diagnosis not present

## 2019-11-10 DIAGNOSIS — S72002D Fracture of unspecified part of neck of left femur, subsequent encounter for closed fracture with routine healing: Secondary | ICD-10-CM | POA: Diagnosis not present

## 2019-11-10 DIAGNOSIS — I480 Paroxysmal atrial fibrillation: Secondary | ICD-10-CM | POA: Diagnosis not present

## 2019-11-10 DIAGNOSIS — Z79899 Other long term (current) drug therapy: Secondary | ICD-10-CM

## 2019-11-10 DIAGNOSIS — S72042A Displaced fracture of base of neck of left femur, initial encounter for closed fracture: Secondary | ICD-10-CM | POA: Diagnosis not present

## 2019-11-10 DIAGNOSIS — R4182 Altered mental status, unspecified: Secondary | ICD-10-CM | POA: Diagnosis not present

## 2019-11-10 DIAGNOSIS — G9389 Other specified disorders of brain: Secondary | ICD-10-CM | POA: Diagnosis not present

## 2019-11-10 DIAGNOSIS — S72032A Displaced midcervical fracture of left femur, initial encounter for closed fracture: Secondary | ICD-10-CM | POA: Diagnosis not present

## 2019-11-10 DIAGNOSIS — I10 Essential (primary) hypertension: Secondary | ICD-10-CM | POA: Diagnosis not present

## 2019-11-10 DIAGNOSIS — R279 Unspecified lack of coordination: Secondary | ICD-10-CM | POA: Diagnosis not present

## 2019-11-10 DIAGNOSIS — I509 Heart failure, unspecified: Secondary | ICD-10-CM | POA: Diagnosis not present

## 2019-11-10 DIAGNOSIS — I110 Hypertensive heart disease with heart failure: Secondary | ICD-10-CM | POA: Diagnosis not present

## 2019-11-10 DIAGNOSIS — E782 Mixed hyperlipidemia: Secondary | ICD-10-CM | POA: Diagnosis not present

## 2019-11-10 DIAGNOSIS — M7989 Other specified soft tissue disorders: Secondary | ICD-10-CM | POA: Diagnosis not present

## 2019-11-10 DIAGNOSIS — Z886 Allergy status to analgesic agent status: Secondary | ICD-10-CM

## 2019-11-10 DIAGNOSIS — R5381 Other malaise: Secondary | ICD-10-CM | POA: Diagnosis not present

## 2019-11-10 DIAGNOSIS — F419 Anxiety disorder, unspecified: Secondary | ICD-10-CM | POA: Diagnosis present

## 2019-11-10 DIAGNOSIS — Z882 Allergy status to sulfonamides status: Secondary | ICD-10-CM | POA: Diagnosis not present

## 2019-11-10 DIAGNOSIS — D62 Acute posthemorrhagic anemia: Secondary | ICD-10-CM | POA: Diagnosis present

## 2019-11-10 DIAGNOSIS — I69359 Hemiplegia and hemiparesis following cerebral infarction affecting unspecified side: Secondary | ICD-10-CM | POA: Diagnosis not present

## 2019-11-10 DIAGNOSIS — J449 Chronic obstructive pulmonary disease, unspecified: Secondary | ICD-10-CM | POA: Diagnosis present

## 2019-11-10 DIAGNOSIS — Z96642 Presence of left artificial hip joint: Secondary | ICD-10-CM | POA: Diagnosis not present

## 2019-11-10 DIAGNOSIS — I4891 Unspecified atrial fibrillation: Secondary | ICD-10-CM

## 2019-11-10 DIAGNOSIS — Z4789 Encounter for other orthopedic aftercare: Secondary | ICD-10-CM | POA: Diagnosis not present

## 2019-11-10 DIAGNOSIS — I4892 Unspecified atrial flutter: Secondary | ICD-10-CM | POA: Diagnosis not present

## 2019-11-10 DIAGNOSIS — S2239XD Fracture of one rib, unspecified side, subsequent encounter for fracture with routine healing: Secondary | ICD-10-CM | POA: Diagnosis not present

## 2019-11-10 DIAGNOSIS — R54 Age-related physical debility: Secondary | ICD-10-CM | POA: Diagnosis present

## 2019-11-10 DIAGNOSIS — E039 Hypothyroidism, unspecified: Secondary | ICD-10-CM | POA: Diagnosis not present

## 2019-11-10 DIAGNOSIS — Z951 Presence of aortocoronary bypass graft: Secondary | ICD-10-CM | POA: Diagnosis not present

## 2019-11-10 DIAGNOSIS — Z20822 Contact with and (suspected) exposure to covid-19: Secondary | ICD-10-CM | POA: Diagnosis present

## 2019-11-10 DIAGNOSIS — I2510 Atherosclerotic heart disease of native coronary artery without angina pectoris: Secondary | ICD-10-CM | POA: Diagnosis present

## 2019-11-10 DIAGNOSIS — R000 Tachycardia, unspecified: Secondary | ICD-10-CM | POA: Diagnosis not present

## 2019-11-10 DIAGNOSIS — S72009A Fracture of unspecified part of neck of unspecified femur, initial encounter for closed fracture: Secondary | ICD-10-CM | POA: Diagnosis present

## 2019-11-10 DIAGNOSIS — S72002A Fracture of unspecified part of neck of left femur, initial encounter for closed fracture: Secondary | ICD-10-CM

## 2019-11-10 DIAGNOSIS — W19XXXA Unspecified fall, initial encounter: Secondary | ICD-10-CM

## 2019-11-10 DIAGNOSIS — J439 Emphysema, unspecified: Secondary | ICD-10-CM | POA: Diagnosis not present

## 2019-11-10 DIAGNOSIS — S2242XA Multiple fractures of ribs, left side, initial encounter for closed fracture: Secondary | ICD-10-CM

## 2019-11-10 DIAGNOSIS — R0689 Other abnormalities of breathing: Secondary | ICD-10-CM | POA: Diagnosis not present

## 2019-11-10 DIAGNOSIS — S59912A Unspecified injury of left forearm, initial encounter: Secondary | ICD-10-CM | POA: Diagnosis not present

## 2019-11-10 DIAGNOSIS — I491 Atrial premature depolarization: Secondary | ICD-10-CM | POA: Diagnosis not present

## 2019-11-10 DIAGNOSIS — S2239XA Fracture of one rib, unspecified side, initial encounter for closed fracture: Secondary | ICD-10-CM

## 2019-11-10 DIAGNOSIS — R600 Localized edema: Secondary | ICD-10-CM | POA: Diagnosis not present

## 2019-11-10 LAB — COMPREHENSIVE METABOLIC PANEL
ALT: 19 U/L (ref 0–44)
AST: 40 U/L (ref 15–41)
Albumin: 2.1 g/dL — ABNORMAL LOW (ref 3.5–5.0)
Alkaline Phosphatase: 160 U/L — ABNORMAL HIGH (ref 38–126)
Anion gap: 10 (ref 5–15)
BUN: 24 mg/dL — ABNORMAL HIGH (ref 8–23)
CO2: 25 mmol/L (ref 22–32)
Calcium: 7.6 mg/dL — ABNORMAL LOW (ref 8.9–10.3)
Chloride: 98 mmol/L (ref 98–111)
Creatinine, Ser: 0.64 mg/dL (ref 0.44–1.00)
GFR, Estimated: >60 mL/min (ref >60)
Glucose, Bld: 115 mg/dL — ABNORMAL HIGH (ref 70–99)
Potassium: 3.3 mmol/L — ABNORMAL LOW (ref 3.5–5.1)
Sodium: 133 mmol/L — ABNORMAL LOW (ref 135–145)
Total Bilirubin: 1.4 mg/dL — ABNORMAL HIGH (ref 0.3–1.2)
Total Protein: 5.4 g/dL — ABNORMAL LOW (ref 6.5–8.1)

## 2019-11-10 LAB — PROTIME-INR
INR: 2.2 — ABNORMAL HIGH (ref 0.8–1.2)
INR: 1.7 — ABNORMAL HIGH (ref 0.8–1.2)
Prothrombin Time: 23.7 seconds — ABNORMAL HIGH (ref 11.4–15.2)
Prothrombin Time: 19.4 seconds — ABNORMAL HIGH (ref 11.4–15.2)

## 2019-11-10 LAB — CBC
HCT: 19.8 % — ABNORMAL LOW (ref 36.0–46.0)
Hemoglobin: 5.8 g/dL — ABNORMAL LOW (ref 12.0–15.0)
MCH: 29.9 pg (ref 26.0–34.0)
MCHC: 29.3 g/dL — ABNORMAL LOW (ref 30.0–36.0)
MCV: 102.1 fL — ABNORMAL HIGH (ref 80.0–100.0)
Platelets: 544 10*3/uL — ABNORMAL HIGH (ref 150–400)
RBC: 1.94 MIL/uL — ABNORMAL LOW (ref 3.87–5.11)
RDW: 21.3 % — ABNORMAL HIGH (ref 11.5–15.5)
WBC: 8.4 10*3/uL (ref 4.0–10.5)
nRBC: 0 % (ref 0.0–0.2)

## 2019-11-10 LAB — RESPIRATORY PANEL BY RT PCR (FLU A&B, COVID)
Influenza A by PCR: NEGATIVE
Influenza B by PCR: NEGATIVE
SARS Coronavirus 2 by RT PCR: NEGATIVE

## 2019-11-10 LAB — PREPARE RBC (CROSSMATCH)

## 2019-11-10 LAB — HEMOGLOBIN AND HEMATOCRIT, BLOOD
HCT: 23.7 % — ABNORMAL LOW (ref 36.0–46.0)
Hemoglobin: 7.5 g/dL — ABNORMAL LOW (ref 12.0–15.0)

## 2019-11-10 MED ORDER — VITAMIN K1 10 MG/ML IJ SOLN
1.0000 mg | Freq: Once | INTRAVENOUS | Status: AC
  Administered 2019-11-10: 1 mg via INTRAVENOUS
  Filled 2019-11-10: qty 0.1

## 2019-11-10 MED ORDER — METOPROLOL TARTRATE 25 MG PO TABS
12.5000 mg | ORAL_TABLET | Freq: Two times a day (BID) | ORAL | Status: DC
  Administered 2019-11-10 – 2019-11-15 (×9): 12.5 mg via ORAL
  Filled 2019-11-10 (×9): qty 1

## 2019-11-10 MED ORDER — FENOFIBRATE 160 MG PO TABS
160.0000 mg | ORAL_TABLET | Freq: Every day | ORAL | Status: DC
  Administered 2019-11-11 – 2019-11-14 (×4): 160 mg via ORAL
  Filled 2019-11-10 (×7): qty 1

## 2019-11-10 MED ORDER — ATORVASTATIN CALCIUM 20 MG PO TABS
40.0000 mg | ORAL_TABLET | Freq: Every day | ORAL | Status: DC
  Administered 2019-11-10 – 2019-11-14 (×5): 40 mg via ORAL
  Filled 2019-11-10 (×5): qty 2

## 2019-11-10 MED ORDER — MORPHINE SULFATE (PF) 2 MG/ML IV SOLN: 0.5000 mg | INTRAVENOUS | Status: DC | PRN

## 2019-11-10 MED ORDER — HYDROCODONE-ACETAMINOPHEN 5-325 MG PO TABS
1.0000 | ORAL_TABLET | Freq: Four times a day (QID) | ORAL | Status: DC | PRN
  Administered 2019-11-10: 2 via ORAL
  Administered 2019-11-11 – 2019-11-13 (×5): 1 via ORAL
  Administered 2019-11-14: 2 via ORAL
  Administered 2019-11-15: 1 via ORAL
  Filled 2019-11-10 (×2): qty 1
  Filled 2019-11-10 (×2): qty 2
  Filled 2019-11-10 (×4): qty 1

## 2019-11-10 MED ORDER — LEVOTHYROXINE SODIUM 50 MCG PO TABS
50.0000 ug | ORAL_TABLET | Freq: Every day | ORAL | Status: DC
  Administered 2019-11-12 – 2019-11-15 (×4): 50 ug via ORAL
  Filled 2019-11-10 (×5): qty 1

## 2019-11-10 MED ORDER — SODIUM CHLORIDE 0.9 % IV SOLN: 10.0000 mL/h | Freq: Once | INTRAVENOUS | Status: DC

## 2019-11-10 MED ORDER — SODIUM CHLORIDE 0.9 % IV BOLUS
500.0000 mL | Freq: Once | INTRAVENOUS | Status: AC
  Administered 2019-11-10: 500 mL via INTRAVENOUS

## 2019-11-10 MED ORDER — FENTANYL CITRATE (PF) 100 MCG/2ML IJ SOLN
50.0000 ug | INTRAMUSCULAR | Status: DC | PRN
  Administered 2019-11-10: 50 ug via INTRAVENOUS
  Filled 2019-11-10: qty 2

## 2019-11-10 MED ORDER — CEFAZOLIN SODIUM-DEXTROSE 2-4 GM/100ML-% IV SOLN
2.0000 g | INTRAVENOUS | Status: AC
  Administered 2019-11-11: 1 g via INTRAVENOUS
  Filled 2019-11-10: qty 100

## 2019-11-10 NOTE — ED Notes (Signed)
Multiple RN attempt 2nd IV access with no success.

## 2019-11-10 NOTE — ED Notes (Addendum)
Report from [facility]. Pt receiving blood transfusion currently, admitting MD at bedside. Left arm is significantly swollen, pt reports intact sensation- is unable to move left arm. Capillary refill is <3 seconds, no bleeding noted. Pt cannot move left leg due to previous stoke.

## 2019-11-10 NOTE — ED Notes (Addendum)
Dr Kerman Passey to bedside to discuss blood transfusion.  Pt refusing consent for blood transfusion at this time to this RN

## 2019-11-10 NOTE — Anesthesia Preprocedure Evaluation (Addendum)
Anesthesia Evaluation  Patient identified by MRN, date of birth, ID band Patient awake    Reviewed: Allergy & Precautions, NPO status , Patient's Chart, lab work & pertinent test results  History of Anesthesia Complications (+) PONV and history of anesthetic complications  Airway Mallampati: III  TM Distance: >3 FB Neck ROM: Full    Dental  (+) Missing, Edentulous Upper, Poor Dentition, Dental Advisory Given   Pulmonary neg sleep apnea, COPD, Current Smoker and Patient abstained from smoking.,    Pulmonary exam normal breath sounds clear to auscultation       Cardiovascular Exercise Tolerance: Poor METS: < 3 Mets hypertension, + CAD, + Past MI and +CHF  (-) dysrhythmias  Rhythm:Regular Rate:Normal - Systolic murmurs    Neuro/Psych PSYCHIATRIC DISORDERS Anxiety Left sided weakness/paralysis CVA, Residual Symptoms    GI/Hepatic neg GERD  ,(+)     (-) substance abuse  ,   Endo/Other  neg diabetesHypothyroidism   Renal/GU negative Renal ROS     Musculoskeletal   Abdominal   Peds  Hematology  (+) Blood dyscrasia, anemia ,   Anesthesia Other Findings Past Medical History: No date: Aneurysm ([place]) No date: Anxiety No date: Arthritis No date: Cancer ([place]) No date: CHF (congestive heart failure) (HCC) No date: COPD (chronic obstructive pulmonary disease) (HCC) No date: Hyperlipidemia No date: Hypertension No date: Stroke [facility]) No date: Thyroid disease  INR of 2.2  Reproductive/Obstetrics                            Anesthesia Physical Anesthesia Plan  ASA: III  Anesthesia Plan: General   Post-op Pain Management:    Induction: Intravenous  PONV Risk Score and Plan: 3 and Ondansetron, Dexamethasone and Treatment may vary due to age or medical condition  Airway Management Planned: Oral ETT  Additional Equipment: None  Intra-op Plan:   Post-operative Plan: Extubation in  OR  Informed Consent: I have reviewed the patients History and Physical, chart, labs and discussed the procedure including the risks, benefits and alternatives for the proposed anesthesia with the patient or authorized representative who has indicated his/her understanding and acceptance.     Dental advisory given  Plan Discussed with: CRNA and Surgeon  Anesthesia Plan Comments: (Patient has had two units of PRBC transfused. Last unit was started after the most recent CBC lab draw showing Hgb of 7.2.  Discussed risks of anesthesia with patient, including PONV, sore throat, lip/dental damage. Rare risks discussed as well, such as cardiorespiratory and neurological sequelae. Patient understands.)        Anesthesia Quick Evaluation

## 2019-11-10 NOTE — H&P (Signed)
History and Physical    Teresa Holmes GQQ:[id number] DOB: 1950/09/14 DOA: 11/10/2019  PCP: Teresa Freshwater, NP  Patient coming from: Home, lives alone but has daytime caretaker  I have personally briefly reviewed patient's old medical records in [place]  Chief Complaint: Fall  HPI: Teresa Holmes is a 69 y.o. female with medical history significant for CVA with residual left hemiplegia, COPD, CAD s/p MIs, paroxysmal atrial fibrillation on Coumadin and hypothyroidism who presents status post fall.  Patient had a mechanical fall several days ago and decided to present today due to persistent left hip pain.  She normally ambulates with a cane and was using the bathroom several days ago when her left leg gave out and she fell onto her left hip.  Denies hitting her head.  Denies any lightheadedness or dizziness.  She laid on the floor but was found shortly by her caretaker.  She has had significant swelling to her left arm since the fall as well.  Patient is on Coumadin for paroxysmal atrial fibrillation.  ED Course: She was afebrile initially hypotensive in the 90/45 and was found to be severely anemic to 5.8 from a baseline around 7.  Creatinine is normal at 0.64 BUN of 24.  Patient is unsure why she is chronically anemic and denies any dark stools or any bleeding.  Chest x-ray shows several left rib fracture notably in the fourth and sixth rib as well as the seventh and eighth's possibly subacute.  Left hip x-ray shows superiorly displaced subcapital fracture of the left femoral neck.  Hematoma noted on ultrasound the left upper extremity but no fractures.  ED physician has discussed with orthopedic surgery Dr. Roland Rack who will take to OR tomorrow and has ordered vitamin K for INR reversal. 1unit of pRBC ordered and hospitalist called for admission.  Review of Systems: Constitutional: No Weight Change, No Fever ENT/Mouth: No sore throat, No Rhinorrhea Eyes: No Eye Pain, No Vision  Changes Cardiovascular: No Chest Pain, no SOB Respiratory: No Cough, No Sputum, No Wheezing, no Dyspnea  Gastrointestinal: No Nausea, No Vomiting, No Diarrhea, No Constipation, No Pain Genitourinary: no Urinary Incontinence Musculoskeletal: + Arthralgias, + Myalgias Skin: No Skin Lesions, No Pruritus, Neuro: + Weakness, No Numbness,  No Loss of Consciousness, No Syncope Psych: No Anxiety/Panic, No Depression, no decrease appetite Heme/Lymph: No Bruising, No Bleeding   Past Medical History:  Diagnosis Date  . Aneurysm ([place])   . Anxiety   . Arthritis   . Cancer ([place])   . CHF (congestive heart failure) ([place])   . COPD (chronic obstructive pulmonary disease) ([place])   . Hyperlipidemia   . Hypertension   . Stroke ([place])   . Thyroid disease     Past Surgical History:  Procedure Laterality Date  . ABDOMINAL HYSTERECTOMY    . AORTIC/RENAL BYPASS    . bypass graft    . CHOLECYSTECTOMY    . CORONARY ARTERY BYPASS GRAFT       reports that she has been smoking cigarettes. She has never used smokeless tobacco. She reports that she does not drink alcohol and does not use drugs. Social History  Allergies  Allergen Reactions  . Aspirin Other (See Comments)    ulcer  . Sulfa Antibiotics Other (See Comments)    unknown    Family History  Problem Relation Age of Onset  . Cancer Mother      Prior to Admission medications   Medication Sig Start Date End Date Taking? Authorizing  Provider  atorvastatin (LIPITOR) 40 MG tablet Take 1 tablet (40 mg total) by mouth daily. 09/10/19  Yes Scarboro, Audie Clear, NP  fenofibrate (TRICOR) 145 MG tablet Take 1 tablet (145 mg total) by mouth daily. 09/10/19  Yes Scarboro, Audie Clear, NP  furosemide (LASIX) 20 MG tablet 20 MG FOR 3 DAYS THEN ONE EVERY OTHER DAY 08/02/19  Yes Lavera Guise, MD  levothyroxine (SYNTHROID) 50 MCG tablet Take 1 tablet (50 mcg total) by mouth daily before breakfast. 10/09/19  Yes Boscia, Heather E, NP  metoprolol tartrate (LOPRESSOR) 25  MG tablet Take 0.5 tablets (12.5 mg total) by mouth 2 (two) times daily. 09/20/19  Yes Lavera Guise, MD  warfarin (COUMADIN) 1 MG tablet Take 1 mg by mouth daily.   Yes [provider]    Physical Exam: Vitals:   11/10/19 1626 11/10/19 1830 11/10/19 1856 11/10/19 1911  BP: (!) 102/58 124/62 124/62 (!) 120/53  Pulse:   78 73  Resp: (!) 21 16 16 15   Temp:   98.4 F (36.9 C) 98.1 F (36.7 C)  TempSrc:   Oral Oral  SpO2:    97%  Weight:      Height:        Constitutional: NAD, calm, comfortable, thin elderly female lying on her left side in bed Vitals:   11/10/19 1626 11/10/19 1830 11/10/19 1856 11/10/19 1911  BP: (!) 102/58 124/62 124/62 (!) 120/53  Pulse:   78 73  Resp: (!) 21 16 16 15   Temp:   98.4 F (36.9 C) 98.1 F (36.7 C)  TempSrc:   Oral Oral  SpO2:    97%  Weight:      Height:       Eyes: PERRL, lids and conjunctivae normal ENMT: Mucous membranes are moist.  Neck: normal, supple Respiratory: Diffuse expiratory wheezing. Normal respiratory effort on ambient air. No accessory muscle use.  Cardiovascular: Regular rate and rhythm, no murmurs / rubs / gallops. No extremity edema. 2+ pedal pulses.   Abdomen: no tenderness, no masses palpated.  Bowel sounds positive.  Musculoskeletal: no clubbing / cyanosis.  Hemiplegia of the left upper and lower extremity with left hand held in flexion.  Significant nonpitting edema throughout the left upper extremity especially in the upper arm with ecchymosis throughout. Skin: no rashes, lesions, ulcers. No induration Neurologic: CN 2-12 grossly intact. Sensation intact,  Strength 5/5 in right extremities. Hemiplegia of the left side.  Psychiatric: Normal judgment and insight. Alert and oriented x 3. Normal mood.     Labs on Admission: I have personally reviewed following labs and imaging studies  CBC: Recent Labs  Lab 11/10/19 1603  WBC 8.4  HGB 5.8*  HCT 19.8*  MCV 102.1*  PLT 834*   Basic Metabolic  Panel: Recent Labs  Lab 11/10/19 1603  NA 133*  K 3.3*  CL 98  CO2 25  GLUCOSE 115*  BUN 24*  CREATININE 0.64  CALCIUM 7.6*   GFR: Estimated Creatinine Clearance: 38.2 mL/min (by C-G formula based on SCr of 0.64 mg/dL). Liver Function Tests: Recent Labs  Lab 11/10/19 1603  AST 40  ALT 19  ALKPHOS 160*  BILITOT 1.4*  PROT 5.4*  ALBUMIN 2.1*   No results for input(s): LIPASE, AMYLASE in the last 168 hours. No results for input(s): AMMONIA in the last 168 hours. Coagulation Profile: Recent Labs  Lab 11/10/19 1603  INR 2.2*   Cardiac Enzymes: No results for input(s): CKTOTAL, CKMB, CKMBINDEX, TROPONINI in the last  168 hours. BNP (last 3 results) No results for input(s): PROBNP in the last 8760 hours. HbA1C: No results for input(s): HGBA1C in the last 72 hours. CBG: No results for input(s): GLUCAP in the last 168 hours. Lipid Profile: No results for input(s): CHOL, HDL, LDLCALC, TRIG, CHOLHDL, LDLDIRECT in the last 72 hours. Thyroid Function Tests: No results for input(s): TSH, T4TOTAL, FREET4, T3FREE, THYROIDAB in the last 72 hours. Anemia Panel: No results for input(s): VITAMINB12, FOLATE, FERRITIN, TIBC, IRON, RETICCTPCT in the last 72 hours. Urine analysis:    Component Value Date/Time   COLORURINE YELLOW (A) 10/03/2018 0513   APPEARANCEUR CLEAR (A) 10/03/2018 0513   APPEARANCEUR Clear 09/05/2017 1641   LABSPEC 1.010 10/03/2018 0513   PHURINE 7.0 10/03/2018 0513   GLUCOSEU >=500 (A) 10/03/2018 0513   HGBUR NEGATIVE 10/03/2018 0513   BILIRUBINUR NEGATIVE 10/03/2018 0513   BILIRUBINUR Negative 09/05/2017 1641   KETONESUR NEGATIVE 10/03/2018 0513   PROTEINUR NEGATIVE 10/03/2018 0513   NITRITE NEGATIVE 10/03/2018 0513   LEUKOCYTESUR SMALL (A) 10/03/2018 0513    Radiological Exams on Admission: DG Chest 1 View  Result Date: 11/10/2019 CLINICAL DATA:  Fall 2 days ago.  Left arm wound. EXAM: CHEST  1 VIEW COMPARISON:  Chest radiographs 09/28/2018 and  01/18/2006. Abdominal radiographs 09/30/2018 FINDINGS: 1640 hours. Mild patient rotation to the left. The heart size and mediastinal contours are stable status post median sternotomy. The lungs appear clear. There is no pleural effusion or pneumothorax. There are several left rib fractures laterally which may be subacute, notably in the 4th and 6th ribs. Fractures of the 7th and 8th ribs may be acute. IMPRESSION: Several left-sided rib fractures, at least some likely subacute. No pleural effusion or pneumothorax. Electronically Signed   By: Richardean Sale M.D.   On: 11/10/2019 17:40   DG Pelvis 1-2 Views  Result Date: 11/10/2019 CLINICAL DATA:  69 year old female with fall. EXAM: PELVIS - 1-2 VIEW COMPARISON:  Pelvic radiograph dated 09/28/2018. FINDINGS: There is a displaced fracture of the left femoral neck with proximal migration of the femoral shaft. No dislocation. There is advanced osteopenia. Moderate stool noted in the rectal vault. The soft tissues are unremarkable. IMPRESSION: Displaced fracture of the left femoral neck. Electronically Signed   By: Anner Crete M.D.   On: 11/10/2019 17:32   DG Forearm Left  Result Date: 11/10/2019 CLINICAL DATA:  69 year old female with fall and trauma to the left upper extremity. EXAM: LEFT FOREARM - 2 VIEW COMPARISON:  None. FINDINGS: Evaluation is very limited due to advanced osteopenia. No definite acute fracture or dislocation. Old appearing fracture deformity of the distal radius. Clinical correlation is recommended. The bones are osteopenic. There is diffuse subcutaneous soft tissue edema. No radiopaque foreign object or soft tissue gas. IMPRESSION: 1. No definite acute fracture or dislocation. Old appearing fracture of the distal radius. 2. Diffuse subcutaneous soft tissue edema. Electronically Signed   By: Anner Crete M.D.   On: 11/10/2019 17:30   CT Head Wo Contrast  Result Date: 11/10/2019 CLINICAL DATA:  Fall 2 days ago EXAM: CT HEAD  WITHOUT CONTRAST TECHNIQUE: Contiguous axial images were obtained from the base of the skull through the vertex without intravenous contrast. COMPARISON:  August 14, 2019 FINDINGS: Brain: No evidence of acute infarction, hemorrhage, hydrocephalus, extra-axial collection or mass lesion/mass effect. Sequela of large RIGHT MCA infarction with encephalomalacia and cortical gliosis involving the RIGHT temporal and frontoparietal lobes. Periventricular white matter hypodensities consistent with sequela of chronic microvascular ischemic disease.  Remote RIGHT cerebellar infarction. Vascular: No hyperdense vessel or unexpected calcification. Skull: Normal. Negative for fracture or focal lesion. Sinuses/Orbits: No acute finding. Other: None. IMPRESSION: 1.  No acute intracranial abnormality. Electronically Signed   By: Valentino Saxon MD   On: 11/10/2019 17:24   US Venous Img Upper Uni Left  Result Date: 11/10/2019 CLINICAL DATA:  Fall, redness and swelling of the left upper extremity EXAM: LEFT UPPER EXTREMITY VENOUS DOPPLER ULTRASOUND TECHNIQUE: Gray-scale sonography with graded compression, as well as color Doppler and duplex ultrasound were performed to evaluate the upper extremity deep venous system from the level of the subclavian vein and including the jugular, axillary, basilic, radial, ulnar and upper cephalic vein. Spectral Doppler was utilized to evaluate flow at rest and with distal augmentation maneuvers. COMPARISON:  None. FINDINGS: Contralateral Subclavian Vein: Respiratory phasicity is normal and symmetric with the symptomatic side. No evidence of thrombus. Normal compressibility. Internal Jugular Vein: No evidence of thrombus. Normal compressibility, respiratory phasicity and response to augmentation. Subclavian Vein: No evidence of thrombus. Normal compressibility, respiratory phasicity and response to augmentation. Axillary Vein: No evidence of thrombus. Normal compressibility, respiratory phasicity  and response to augmentation. Cephalic Vein: No evidence of thrombus. Normal compressibility, respiratory phasicity and response to augmentation. Basilic Vein: No evidence of thrombus. Normal compressibility, respiratory phasicity and response to augmentation. Brachial Veins: No evidence of thrombus. Normal compressibility, respiratory phasicity and response to augmentation. Radial Veins: No evidence of thrombus. Normal compressibility, respiratory phasicity and response to augmentation. Ulnar Veins: No evidence of thrombus. Normal compressibility, respiratory phasicity and response to augmentation. Venous Reflux:  None visualized. Other Findings: There is a hypoechoic collection seen in the region of the left axilla at the level of though separate from the axillary vein which could reflect a hematoma or seroma though is overall indeterminate on these ultrasound images. Additionally, there is diffuse soft tissue edema of the left upper extremity most pronounced at the level of the upper arm. IMPRESSION: 1. No evidence of DVT within the left upper extremity. 2. Hypoechoic collection in the region of the left axilla measuring 2.9 by 1.0 cm in transverse dimensions (image 12/37) could reflect a hematoma in the setting of trauma though is indeterminate on these ultrasound images. Could consider further evaluation with cross-sectional imaging. 3. Diffuse soft tissue edema of the left upper extremity, most pronounced at the level of the upper arm. Electronically Signed   By: Lovena Le M.D.   On: 11/10/2019 17:55   DG Femur Min 2 Views Left  Result Date: 11/10/2019 CLINICAL DATA:  Fall 2 days ago.  Hip deformity. EXAM: LEFT FEMUR 2 VIEWS COMPARISON:  Pelvic radiographs same date. One view abdomen 07/15/2019. FINDINGS: The bones are diffusely demineralized. There is a superiorly displaced subcapital fracture of the left femoral neck. The fracture margins are somewhat ill-defined, and this could be subacute in age.  The distal femur is intact. The visualized bony pelvis is intact. Mild degenerative changes are present at the left hip and knee. There are vascular clips in the left groin and medial thigh. IMPRESSION: Superiorly displaced subcapital fracture of the left femoral neck, possibly subacute in age. Electronically Signed   By: Richardean Sale M.D.   On: 11/10/2019 17:36      Assessment/Plan  Left superiorly displaced subcapital fracture of the femur neck s/p mechanical fall Ortho aware and will take to the OR pending hemoglobin and INR level tomorrow.  Ortho has ordered reversal of Coumadin with vitamin K. PRN pain management  Left sided 4th, 6th, 7th and 8th rib fracture conservative managment with PRN pain meds   Acute blood loss anemia baseline around 7 -unknown reason now down to 5.8 getting 1u pRBC transfusion and was give 1mg  vitamin K for reversal of Coumadin by Ortho Repeat hemoglobin following transfusion with goal of 7  Left UE skin tear wound s/p fall daily wound care   Paroxysmal atrial fibrillation Hold Coumadin given no hemoglobin and plans for left hip fracture Continue metoprolol  History of CVA with left-sided residual hemiplegia Continue statin.  Holding Coumadin  Hypothyroidism Continue Synthroid  DVT prophylaxis:SCDs Code Status: Full Family Communication: Plan discussed with patient at bedside  disposition Plan: Home with at least 2 midnight stays  Consults called: Ortho Admission status: inpatient  Status is: Inpatient  Remains inpatient appropriate because:IV treatments appropriate due to intensity of illness or inability to take PO   Dispo: The patient is from: Home              Anticipated d/c is to: Home              Anticipated d/c date is: 3 days              Patient currently is not medically stable to d/c.         Orene Desanctis DO [facility]   If 7PM-7AM, please contact night-coverage [email or web address]   11/10/2019, 8:46 PM

## 2019-11-10 NOTE — ED Notes (Signed)
Dr Kerman Passey to bedside to discuss risks/benefits of blood administration again. Pt agrees to receive blood transfusion. Denies further questions or concerns. Blood consent signed with Dr Kerman Passey

## 2019-11-10 NOTE — ED Provider Notes (Addendum)
[facility] Emergency Department Provider Note  Time seen: 4:27 PM  I have reviewed the triage vital signs and the nursing notes.   HISTORY  Chief Complaint Fall and Wound Check   HPI Teresa Holmes is a 69 y.o. female with a past medical history anxiety, arthritis, CHF, COPD, hypertension, hyperlipidemia, prior CVA on warfarin, presents to the emergency department with left arm pain and swelling from a fall 2 days ago.  Patient also states left thigh swelling.  States she has not been able to walk since the fall 2 days ago.  Patient states 2 weeks ago she had a fall resulting in a tailbone fracture.  Patient has a history of anticoagulation.  Currently patient is awake alert oriented x4.  Main complaint is mild to moderate left arm pain as well as moderate left thigh pain.  States she has a history of a left hip replacement previously.   Past Medical History:  Diagnosis Date  . Aneurysm ([place])   . Anxiety   . Arthritis   . Cancer ([place])   . CHF (congestive heart failure) ([place])   . COPD (chronic obstructive pulmonary disease) ([place])   . Hyperlipidemia   . Hypertension   . Stroke ([place])   . Thyroid disease     Patient Active Problem List   Diagnosis Date Noted  . Pulmonary emphysema ([place]) 10/23/2019  . Pelvic fracture ([place]) 09/28/2018  . Encounter for long-term (current) use of medications 05/11/2017  . Chronic pain disorder 05/11/2017  . Generalized anxiety disorder 03/14/2017  . Essential (primary) hypertension 03/14/2017  . Hemiplegia and hemiparesis following cerebral infarction affecting left non-dominant side ([place]) 03/14/2017  . Vitamin D deficiency, unspecified 03/14/2017  . Personal history of venous thrombosis and embolism 03/14/2017  . Congenital spondylolisthesis 03/14/2017  . Nicotine dependence, cigarettes, uncomplicated 58/10/9831  . Mixed hyperlipidemia 03/14/2017  . Long term (current) use of anticoagulants 03/14/2017  . Other  intervertebral disc degeneration, thoracolumbar region 03/14/2017  . Lumbago with sciatica 03/14/2017  . Cardiac arrhythmia 03/14/2017  . Myalgia 03/14/2017  . Hypothyroidism 03/14/2017  . Difficulty walking 03/14/2017  . Left knee pain 03/14/2017    Past Surgical History:  Procedure Laterality Date  . ABDOMINAL HYSTERECTOMY    . AORTIC/RENAL BYPASS    . bypass graft    . CHOLECYSTECTOMY    . CORONARY ARTERY BYPASS GRAFT      Prior to Admission medications   Medication Sig Start Date End Date Taking? Authorizing Provider  acetaminophen (TYLENOL) 325 MG tablet Take 2 tablets (650 mg total) by mouth every 6 (six) hours as needed for mild pain (or Fever >/= 101). 10/02/18   Bettey Costa, MD  albuterol (VENTOLIN HFA) 108 (90 Base) MCG/ACT inhaler Inhale 2 puffs into the lungs every 4 (four) hours as needed for wheezing or shortness of breath. 05/24/18   Ronnell Freshwater, NP  arformoterol (BROVANA) 15 MCG/2ML NEBU Take 2 mLs (15 mcg total) by nebulization 2 (two) times daily. 07/25/18   Ronnell Freshwater, NP  atorvastatin (LIPITOR) 40 MG tablet Take 1 tablet (40 mg total) by mouth daily. 09/10/19   Kendell Bane, NP  fenofibrate (TRICOR) 145 MG tablet Take 1 tablet (145 mg total) by mouth daily. 09/10/19   Kendell Bane, NP  ferrous sulfate (FERROUSUL) 325 (65 FE) MG tablet Take 1 tablet (325 mg total) by mouth daily with breakfast. 10/02/18   Bettey Costa, MD  furosemide (LASIX) 20 MG tablet 20 MG FOR 3  DAYS THEN ONE EVERY OTHER DAY 08/02/19   Lavera Guise, MD  guaiFENesin (MUCINEX) 600 MG 12 hr tablet Take 1 mg by mouth every morning.    [provider]  HYDROcodone-acetaminophen [facility]) 10-325 MG tablet Take half tab bid prn for acute and severe hip pain 10/23/19   Lavera Guise, MD  levothyroxine (SYNTHROID) 50 MCG tablet Take 1 tablet (50 mcg total) by mouth daily before breakfast. 10/09/19   Ronnell Freshwater, NP  lidocaine (LIDODERM) 5 % Place 1 patch onto the skin daily. Remove &  Discard patch within 12 hours or as directed by MD 10/02/18   Bettey Costa, MD  metoprolol tartrate (LOPRESSOR) 25 MG tablet Take 0.5 tablets (12.5 mg total) by mouth 2 (two) times daily. 09/20/19   Lavera Guise, MD  warfarin (COUMADIN) 2 MG tablet Take 2 mg by mouth daily.    [provider]    Allergies  Allergen Reactions  . Aspirin Other (See Comments)    ulcer  . Sulfa Antibiotics Other (See Comments)    unknown    Family History  Problem Relation Age of Onset  . Cancer Mother     Social History Social History   Tobacco Use  . Smoking status: Current Every Day Smoker    Types: Cigarettes  . Smokeless tobacco: Never Used  Vaping Use  . Vaping Use: Never used  Substance Use Topics  . Alcohol use: No  . Drug use: Never    Review of Systems Constitutional: Negative for fever. Cardiovascular: Negative for chest pain. Respiratory: Negative for shortness of breath. Gastrointestinal: Negative for abdominal pain, vomiting Musculoskeletal: Left arm pain and swelling.  Left thigh pain and swelling. Neurological: Negative for headache All other ROS negative  ____________________________________________   PHYSICAL EXAM:  VITAL SIGNS: ED Triage Vitals  Enc Vitals Group     BP 11/10/19 1605 (!) 99/45     Pulse Rate 11/10/19 1605 80     Resp 11/10/19 1605 18     Temp 11/10/19 1605 98.9 F (37.2 C)     Temp Source 11/10/19 1605 Oral     SpO2 11/10/19 1605 93 %     Weight 11/10/19 1601 81 lb 9.1 oz (37 kg)     Height 11/10/19 1601 4\' 9"  (1.448 m)     Head Circumference --      Peak Flow --      Pain Score 11/10/19 1601 9     Pain Loc --      Pain Edu? --      Excl. in [place]? --     Constitutional: Alert and oriented.  No distress.  Does appear frail. Eyes: Normal exam ENT      Head: Normocephalic and atraumatic.      Mouth/Throat: Mucous membranes are moist. Cardiovascular: Normal rate, regular rhythm.  Respiratory: Normal respiratory effort without  tachypnea nor retractions. Breath sounds are clear  Gastrointestinal: Soft and nontender. No distention.   Musculoskeletal: Patient has moderate swelling of the left arm/forearm but appears to be neuro vastly intact distally.  Patient does have a small laceration approximately 3 cm to the left forearm that occurred 2 days ago per patient.  The swelling appears most consistent with hematoma to the area and less likely cellulitis.  Patient also has left thigh swelling and a shortened and externally rotated left lower extremity.  Does state mild pain with range of motion.  Also neurovascularly intact distally. Neurologic:  Normal speech and language.  No gross focal neurologic deficits  Skin:  Skin is warm.  Small laceration left forearm as described above. Psychiatric: Mood and affect are normal.  ____________________________________________    EKG  EKG viewed and interpreted by myself shows a normal sinus rhythm at 77 bpm with a narrow QRS, normal axis, normal intervals, nonspecific ST changes.  ____________________________________________    RADIOLOGY  Forearm x-rays negative for acute fracture. Femur x-ray shows superiorly displaced subcapital femoral neck fracture. Chest x-ray shows several left-sided rib fractures some of which are subacute. Pelvis x-ray which I have personally reviewed the images shows displaced fracture of the left femoral neck. CT head shows no acute abnormality.  ____________________________________________   INITIAL IMPRESSION / ASSESSMENT AND PLAN / ED COURSE  Pertinent labs & imaging results that were available during my care of the patient were reviewed by me and considered in my medical decision making (see chart for details).   Patient presents to the emergency department after a fall 2 days ago.  Patient states she has not been able to ambulate since the fall.  She does have swelling of her left hip/thigh consistent with possible hematoma but given the  shortened and externally rotated left lower extremity there is concern for a hip fracture, dislocation or femur fracture.  We will obtain x-rays of the left arm, left hip and left femur.  We will plan ultrasound of the left upper extremity.  We will check labs and EKG as well as a chest x-ray for possible preop purposes.  We will swab for Covid.  I do not believe patient will be able to be discharged home regardless of the findings given her inability to walk.  Patient's x-rays show a left femoral neck fracture.  Patient's lab work shows hemoglobin of 5.8 which is decreased from her baseline around 7.  We will transfuse 1 unit.  We will discuss with orthopedics patient will require admission to the hospital service for further work-up, and medical optimization prior to the OR.  I spoke to Dr. Roland Rack of orthopedics.  I spoke to the hospitalist will be admitting the patient.  Patient has agreed to blood products.  Teresa Holmes was evaluated in Emergency Department on 11/10/2019 for the symptoms described in the history of present illness. She was evaluated in the context of the global COVID-19 pandemic, which necessitated consideration that the patient might be at risk for infection with the SARS-CoV-2 virus that causes COVID-19. Institutional protocols and algorithms that pertain to the evaluation of patients at risk for COVID-19 are in a state of rapid change based on information released by regulatory bodies including the CDC and federal and state organizations. These policies and algorithms were followed during the patient's care in the ED.  CRITICAL CARE Performed by: Harvest Dark   Total critical care time: 30 minutes  Critical care time was exclusive of separately billable procedures and treating other patients.  Critical care was necessary to treat or prevent imminent or life-threatening deterioration.  Critical care was time spent personally by me on the following activities:  development of treatment plan with patient and/or surrogate as well as nursing, discussions with consultants, evaluation of patient's response to treatment, examination of patient, obtaining history from patient or surrogate, ordering and performing treatments and interventions, ordering and review of laboratory studies, ordering and review of radiographic studies, pulse oximetry and re-evaluation of patient's condition.  ____________________________________________   FINAL CLINICAL IMPRESSION(S) / ED DIAGNOSES  Anemia Fall Left femoral neck fracture Fall  Harvest Dark, MD 11/10/19 Junious Dresser    Harvest Dark, MD 11/10/19 [phone]

## 2019-11-10 NOTE — ED Triage Notes (Addendum)
Pt to ED via [facility] for chief complaint of fall x2 days ago that resulted in wound to left arm, wound is now swollen, red and warm to touch.  Hx stroke, deficits to left side.  EMS reports fall 2 weeks ago that resulted in tailbone fx.  Hx of blood clots and blood thinner use.  Denies hitting head with fall EMS reports HR 70's, 98% on RA, 90s SBP Pt alert and oriented, clear speech  Outward rotation noted to left hip

## 2019-11-11 ENCOUNTER — Inpatient Hospital Stay: Payer: Medicare Other | Admitting: Anesthesiology

## 2019-11-11 ENCOUNTER — Encounter: Payer: Self-pay | Admitting: Family Medicine

## 2019-11-11 ENCOUNTER — Encounter
Admission: EM | Disposition: A | Discharge status: Skilled Nursing Facility | Payer: Self-pay | Source: Home / Self Care | Attending: Internal Medicine

## 2019-11-11 ENCOUNTER — Inpatient Hospital Stay: Payer: Medicare Other

## 2019-11-11 HISTORY — PX: HIP ARTHROPLASTY: SHX981

## 2019-11-11 LAB — BASIC METABOLIC PANEL
Anion gap: 8 (ref 5–15)
BUN: 19 mg/dL (ref 8–23)
CO2: 26 mmol/L (ref 22–32)
Calcium: 7.4 mg/dL — ABNORMAL LOW (ref 8.9–10.3)
Chloride: 98 mmol/L (ref 98–111)
Creatinine, Ser: 0.47 mg/dL (ref 0.44–1.00)
GFR, Estimated: >60 mL/min (ref >60)
Glucose, Bld: 94 mg/dL (ref 70–99)
Potassium: 3.5 mmol/L (ref 3.5–5.1)
Sodium: 132 mmol/L — ABNORMAL LOW (ref 135–145)

## 2019-11-11 LAB — PROTIME-INR
INR: 1.3 — ABNORMAL HIGH (ref 0.8–1.2)
INR: 1.2 (ref 0.8–1.2)
Prothrombin Time: 16 seconds — ABNORMAL HIGH (ref 11.4–15.2)
Prothrombin Time: 14.9 seconds (ref 11.4–15.2)

## 2019-11-11 LAB — CBC
HCT: 22.9 % — ABNORMAL LOW (ref 36.0–46.0)
Hemoglobin: 7.2 g/dL — ABNORMAL LOW (ref 12.0–15.0)
MCH: 30.5 pg (ref 26.0–34.0)
MCHC: 31.4 g/dL (ref 30.0–36.0)
MCV: 97 fL (ref 80.0–100.0)
Platelets: 415 10*3/uL — ABNORMAL HIGH (ref 150–400)
RBC: 2.36 MIL/uL — ABNORMAL LOW (ref 3.87–5.11)
RDW: 19.6 % — ABNORMAL HIGH (ref 11.5–15.5)
WBC: 7.5 10*3/uL (ref 4.0–10.5)
nRBC: 0 % (ref 0.0–0.2)

## 2019-11-11 LAB — HIV ANTIBODY (ROUTINE TESTING W REFLEX): HIV Screen 4th Generation wRfx: NONREACTIVE

## 2019-11-11 LAB — PREPARE RBC (CROSSMATCH)

## 2019-11-11 SURGERY — HEMIARTHROPLASTY, HIP, DIRECT ANTERIOR APPROACH, FOR FRACTURE
Anesthesia: General | Site: Hip | Laterality: Left

## 2019-11-11 MED ORDER — DIPHENHYDRAMINE HCL 12.5 MG/5ML PO ELIX
12.5000 mg | ORAL_SOLUTION | ORAL | Status: DC | PRN
  Administered 2019-11-14: 12.5 mg via ORAL
  Filled 2019-11-11: qty 5

## 2019-11-11 MED ORDER — BISACODYL 10 MG RE SUPP: 10.0000 mg | Freq: Every day | RECTAL | Status: DC | PRN

## 2019-11-11 MED ORDER — VITAMIN K1 10 MG/ML IJ SOLN
1.0000 mg | Freq: Once | INTRAVENOUS | Status: AC
  Administered 2019-11-11: 1 mg via INTRAVENOUS
  Filled 2019-11-11: qty 0.1

## 2019-11-11 MED ORDER — CALCIUM CHLORIDE 10 % IV SOLN
INTRAVENOUS | Status: AC
  Filled 2019-11-11: qty 10

## 2019-11-11 MED ORDER — SODIUM CHLORIDE 0.9 % IV SOLN: INTRAVENOUS | Status: DC

## 2019-11-11 MED ORDER — SODIUM CHLORIDE 0.9% IV SOLUTION
Freq: Once | INTRAVENOUS | Status: AC
  Filled 2019-11-11: qty 250

## 2019-11-11 MED ORDER — TRANEXAMIC ACID 1000 MG/10ML IV SOLN
INTRAVENOUS | Status: DC | PRN
  Administered 2019-11-11: 1000 mg via TOPICAL

## 2019-11-11 MED ORDER — WARFARIN SODIUM 2 MG PO TABS
2.0000 mg | ORAL_TABLET | Freq: Once | ORAL | Status: AC
  Administered 2019-11-11: 2 mg via ORAL
  Filled 2019-11-11: qty 1

## 2019-11-11 MED ORDER — FLEET ENEMA 7-19 GM/118ML RE ENEM: 1.0000 | ENEMA | Freq: Once | RECTAL | Status: DC | PRN

## 2019-11-11 MED ORDER — ACETAMINOPHEN 500 MG PO TABS
500.0000 mg | ORAL_TABLET | Freq: Four times a day (QID) | ORAL | Status: AC
  Administered 2019-11-11 – 2019-11-12 (×4): 500 mg via ORAL
  Filled 2019-11-11 (×4): qty 1

## 2019-11-11 MED ORDER — MAGNESIUM HYDROXIDE 400 MG/5ML PO SUSP
30.0000 mL | Freq: Every day | ORAL | Status: DC | PRN
  Filled 2019-11-11: qty 30

## 2019-11-11 MED ORDER — BUPIVACAINE-EPINEPHRINE (PF) 0.5% -1:200000 IJ SOLN
INTRAMUSCULAR | Status: DC | PRN
  Administered 2019-11-11: 30 mL via PERINEURAL

## 2019-11-11 MED ORDER — FENTANYL CITRATE (PF) 100 MCG/2ML IJ SOLN: 25.0000 ug | INTRAMUSCULAR | Status: DC | PRN

## 2019-11-11 MED ORDER — LIDOCAINE HCL (PF) 2 % IJ SOLN
INTRAMUSCULAR | Status: AC
  Filled 2019-11-11: qty 5

## 2019-11-11 MED ORDER — LACTATED RINGERS IV SOLN: INTRAVENOUS | Status: DC | PRN

## 2019-11-11 MED ORDER — PHENYLEPHRINE HCL (PRESSORS) 10 MG/ML IV SOLN
INTRAVENOUS | Status: DC | PRN
  Administered 2019-11-11: 50 ug via INTRAVENOUS

## 2019-11-11 MED ORDER — KETAMINE HCL 50 MG/ML IJ SOLN
INTRAMUSCULAR | Status: AC
  Filled 2019-11-11: qty 10

## 2019-11-11 MED ORDER — ENOXAPARIN SODIUM 30 MG/0.3ML ~~LOC~~ SOLN
30.0000 mg | SUBCUTANEOUS | Status: DC
  Administered 2019-11-12: 30 mg via SUBCUTANEOUS
  Filled 2019-11-11: qty 0.3

## 2019-11-11 MED ORDER — ONDANSETRON HCL 4 MG/2ML IJ SOLN
INTRAMUSCULAR | Status: DC | PRN
  Administered 2019-11-11: 4 mg via INTRAVENOUS

## 2019-11-11 MED ORDER — FENTANYL CITRATE (PF) 100 MCG/2ML IJ SOLN
INTRAMUSCULAR | Status: AC
  Filled 2019-11-11: qty 2

## 2019-11-11 MED ORDER — ONDANSETRON HCL 4 MG PO TABS: 4.0000 mg | ORAL_TABLET | Freq: Four times a day (QID) | ORAL | Status: DC | PRN

## 2019-11-11 MED ORDER — ROCURONIUM BROMIDE 100 MG/10ML IV SOLN
INTRAVENOUS | Status: DC | PRN
  Administered 2019-11-11: 40 mg via INTRAVENOUS

## 2019-11-11 MED ORDER — OXYCODONE HCL 5 MG PO TABS: 5.0000 mg | ORAL_TABLET | Freq: Once | ORAL | Status: DC | PRN

## 2019-11-11 MED ORDER — MORPHINE SULFATE (PF) 2 MG/ML IV SOLN: 0.5000 mg | INTRAVENOUS | Status: DC | PRN

## 2019-11-11 MED ORDER — BUPIVACAINE LIPOSOME 1.3 % IJ SUSP
INTRAMUSCULAR | Status: DC | PRN
  Administered 2019-11-11: 20 mL

## 2019-11-11 MED ORDER — LIDOCAINE HCL (CARDIAC) PF 100 MG/5ML IV SOSY
PREFILLED_SYRINGE | INTRAVENOUS | Status: DC | PRN
  Administered 2019-11-11: 40 mg via INTRAVENOUS

## 2019-11-11 MED ORDER — ACETAMINOPHEN 10 MG/ML IV SOLN
INTRAVENOUS | Status: DC | PRN
  Administered 2019-11-11: 500 mg via INTRAVENOUS

## 2019-11-11 MED ORDER — CEFAZOLIN SODIUM-DEXTROSE 1-4 GM/50ML-% IV SOLN
1.0000 g | Freq: Four times a day (QID) | INTRAVENOUS | Status: AC
  Administered 2019-11-11 – 2019-11-12 (×3): 1 g via INTRAVENOUS
  Filled 2019-11-11 (×3): qty 50

## 2019-11-11 MED ORDER — DEXAMETHASONE SODIUM PHOSPHATE 10 MG/ML IJ SOLN
INTRAMUSCULAR | Status: AC
  Filled 2019-11-11: qty 1

## 2019-11-11 MED ORDER — ACETAMINOPHEN 325 MG PO TABS
325.0000 mg | ORAL_TABLET | Freq: Four times a day (QID) | ORAL | Status: DC | PRN
  Filled 2019-11-11: qty 2

## 2019-11-11 MED ORDER — ONDANSETRON HCL 4 MG/2ML IJ SOLN: 4.0000 mg | Freq: Four times a day (QID) | INTRAMUSCULAR | Status: DC | PRN

## 2019-11-11 MED ORDER — TRAMADOL HCL 50 MG PO TABS
50.0000 mg | ORAL_TABLET | Freq: Four times a day (QID) | ORAL | Status: DC | PRN
  Filled 2019-11-11: qty 1

## 2019-11-11 MED ORDER — WARFARIN - PHARMACIST DOSING INPATIENT: Freq: Every day | Status: DC

## 2019-11-11 MED ORDER — OXYCODONE HCL 5 MG/5ML PO SOLN: 5.0000 mg | Freq: Once | ORAL | Status: DC | PRN

## 2019-11-11 MED ORDER — ONDANSETRON HCL 4 MG/2ML IJ SOLN
INTRAMUSCULAR | Status: AC
  Filled 2019-11-11: qty 2

## 2019-11-11 MED ORDER — ONDANSETRON HCL 4 MG/2ML IJ SOLN: 4.0000 mg | Freq: Once | INTRAMUSCULAR | Status: DC | PRN

## 2019-11-11 MED ORDER — METOCLOPRAMIDE HCL 5 MG/ML IJ SOLN: 5.0000 mg | Freq: Three times a day (TID) | INTRAMUSCULAR | Status: DC | PRN

## 2019-11-11 MED ORDER — CALCIUM CHLORIDE 10 % IV SOLN
INTRAVENOUS | Status: DC | PRN
  Administered 2019-11-11: 1 g via INTRAVENOUS

## 2019-11-11 MED ORDER — ROCURONIUM BROMIDE 10 MG/ML (PF) SYRINGE
PREFILLED_SYRINGE | INTRAVENOUS | Status: AC
  Filled 2019-11-11: qty 10

## 2019-11-11 MED ORDER — SUGAMMADEX SODIUM 200 MG/2ML IV SOLN
INTRAVENOUS | Status: DC | PRN
  Administered 2019-11-11: 100 mg via INTRAVENOUS

## 2019-11-11 MED ORDER — METOCLOPRAMIDE HCL 10 MG PO TABS: 5.0000 mg | ORAL_TABLET | Freq: Three times a day (TID) | ORAL | Status: DC | PRN

## 2019-11-11 MED ORDER — ACETAMINOPHEN 10 MG/ML IV SOLN
INTRAVENOUS | Status: AC
  Filled 2019-11-11: qty 100

## 2019-11-11 MED ORDER — KETAMINE HCL 10 MG/ML IJ SOLN
INTRAMUSCULAR | Status: DC | PRN
  Administered 2019-11-11: 30 mg via INTRAVENOUS

## 2019-11-11 MED ORDER — FENTANYL CITRATE (PF) 100 MCG/2ML IJ SOLN
INTRAMUSCULAR | Status: DC | PRN
  Administered 2019-11-11 (×2): 50 ug via INTRAVENOUS
  Administered 2019-11-11 (×2): 25 ug via INTRAVENOUS

## 2019-11-11 MED ORDER — DEXAMETHASONE SODIUM PHOSPHATE 10 MG/ML IJ SOLN
INTRAMUSCULAR | Status: DC | PRN
  Administered 2019-11-11: 10 mg via INTRAVENOUS

## 2019-11-11 MED ORDER — PROPOFOL 10 MG/ML IV BOLUS
INTRAVENOUS | Status: DC | PRN
  Administered 2019-11-11 (×2): 30 mg via INTRAVENOUS
  Administered 2019-11-11 (×2): 50 mg via INTRAVENOUS

## 2019-11-11 MED ORDER — DOCUSATE SODIUM 100 MG PO CAPS
100.0000 mg | ORAL_CAPSULE | Freq: Two times a day (BID) | ORAL | Status: DC
  Administered 2019-11-11 – 2019-11-14 (×5): 100 mg via ORAL
  Filled 2019-11-11 (×8): qty 1

## 2019-11-11 SURGICAL SUPPLY — 73 items
APL PRP STRL LF DISP 70% ISPRP (MISCELLANEOUS) ×2
BAG DECANTER FOR FLEXI CONT (MISCELLANEOUS) IMPLANT
BLADE SAGITTAL WIDE XTHICK NO (BLADE) ×2 IMPLANT
BLADE SURG SZ20 CARB STEEL (BLADE) ×2 IMPLANT
BNDG COHESIVE 6X5 TAN STRL LF (GAUZE/BANDAGES/DRESSINGS) ×2 IMPLANT
BOWL CEMENT MIXING ADV NOZZLE (MISCELLANEOUS) IMPLANT
CANISTER SUCT 1200ML W/VALVE (MISCELLANEOUS) ×2 IMPLANT
CANISTER SUCT 3000ML PPV (MISCELLANEOUS) ×4 IMPLANT
CHLORAPREP W/TINT 26 (MISCELLANEOUS) ×4 IMPLANT
COVER WAND RF STERILE (DRAPES) ×2 IMPLANT
DECANTER SPIKE VIAL GLASS SM (MISCELLANEOUS) ×4 IMPLANT
DRAPE 3/4 80X56 (DRAPES) ×2 IMPLANT
DRAPE INCISE IOBAN 66X60 STRL (DRAPES) ×2 IMPLANT
DRAPE SPLIT 6X30 W/TAPE (DRAPES) ×4 IMPLANT
DRAPE SURG 17X11 SM STRL (DRAPES) ×2 IMPLANT
DRAPE SURG 17X23 STRL (DRAPES) ×2 IMPLANT
DRSG OPSITE POSTOP 4X12 (GAUZE/BANDAGES/DRESSINGS) ×2 IMPLANT
DRSG OPSITE POSTOP 4X14 (GAUZE/BANDAGES/DRESSINGS) IMPLANT
DRSG OPSITE POSTOP 4X6 (GAUZE/BANDAGES/DRESSINGS) ×1 IMPLANT
DRSG OPSITE POSTOP 4X8 (GAUZE/BANDAGES/DRESSINGS) ×3 IMPLANT
ELECT BLADE 6.5 EXT (BLADE) ×2 IMPLANT
ELECT CAUTERY BLADE 6.4 (BLADE) ×2 IMPLANT
ELECT REM PT RETURN 9FT ADLT (ELECTROSURGICAL) ×2
ELECTRODE REM PT RTRN 9FT ADLT (ELECTROSURGICAL) ×1 IMPLANT
GAUZE PACK 2X3YD (PACKING) IMPLANT
GAUZE XEROFORM 1X8 LF (GAUZE/BANDAGES/DRESSINGS) ×4 IMPLANT
GLOVE BIO SURGEON STRL SZ8 (GLOVE) ×4 IMPLANT
GLOVE BIOGEL M STRL SZ7.5 (GLOVE) IMPLANT
GLOVE BIOGEL PI IND STRL 8 (GLOVE) IMPLANT
GLOVE BIOGEL PI INDICATOR 8 (GLOVE)
GLOVE INDICATOR 8.0 STRL GRN (GLOVE) ×2 IMPLANT
GOWN STRL REUS W/ TWL LRG LVL3 (GOWN DISPOSABLE) ×1 IMPLANT
GOWN STRL REUS W/ TWL XL LVL3 (GOWN DISPOSABLE) ×1 IMPLANT
GOWN STRL REUS W/TWL LRG LVL3 (GOWN DISPOSABLE) ×2
GOWN STRL REUS W/TWL XL LVL3 (GOWN DISPOSABLE) ×2
HEAD ENDO II MOD SZ 41 (Orthopedic Implant) ×1 IMPLANT
HOOD PEEL AWAY FLYTE STAYCOOL (MISCELLANEOUS) ×4 IMPLANT
INSERT TAPER ENDO II -6 (Orthopedic Implant) ×2 IMPLANT
IV NS 100ML SINGLE PACK (IV SOLUTION) IMPLANT
LABEL OR SOLS (LABEL) ×2 IMPLANT
NDL FILTER BLUNT 18X1 1/2 (NEEDLE) ×1 IMPLANT
NDL REVERSE CUT 1/2 CRC (NEEDLE) IMPLANT
NDL SAFETY ECLIPSE 18X1.5 (NEEDLE) ×1 IMPLANT
NEEDLE FILTER BLUNT 18X 1/2SAF (NEEDLE) ×1
NEEDLE FILTER BLUNT 18X1 1/2 (NEEDLE) ×1 IMPLANT
NEEDLE HYPO 18GX1.5 SHARP (NEEDLE) ×2
NEEDLE REVERSE CUT 1/2 CRC (NEEDLE) ×2 IMPLANT
NEEDLE SPNL 20GX3.5 QUINCKE YW (NEEDLE) ×2 IMPLANT
NS IRRIG 1000ML POUR BTL (IV SOLUTION) ×2 IMPLANT
PACK HIP PROSTHESIS (MISCELLANEOUS) ×2 IMPLANT
PULSAVAC PLUS IRRIG FAN TIP (DISPOSABLE) ×2
SOL .9 NS 3000ML IRR  AL (IV SOLUTION) ×4
SOL .9 NS 3000ML IRR AL (IV SOLUTION) ×2
SOL .9 NS 3000ML IRR UROMATIC (IV SOLUTION) ×2 IMPLANT
STAPLER SKIN PROX 35W (STAPLE) ×2 IMPLANT
STEM COLLARLESS RED 11X135MM (Stem) ×2 IMPLANT
STRAP SAFETY 5IN WIDE (MISCELLANEOUS) ×2 IMPLANT
SUT ETHIBOND 2 V 37 (SUTURE) ×3 IMPLANT
SUT FIBERWIRE #2 38 T-5 BLUE (SUTURE) ×4
SUT VIC AB 1 CT1 36 (SUTURE) ×2 IMPLANT
SUT VIC AB 2-0 CT1 (SUTURE) ×5 IMPLANT
SUT VIC AB 2-0 CT1 27 (SUTURE)
SUT VIC AB 2-0 CT1 TAPERPNT 27 (SUTURE) IMPLANT
SUT VICRYL 1-0 27IN ABS (SUTURE) ×4
SUTURE FIBERWR #2 38 T-5 BLUE (SUTURE) ×2 IMPLANT
SUTURE VICRYL 1-0 27IN ABS (SUTURE) ×2 IMPLANT
SYR 10ML LL (SYRINGE) ×4 IMPLANT
SYR 30ML LL (SYRINGE) ×8 IMPLANT
SYR BULB IRRIG 60ML STRL (SYRINGE) ×1 IMPLANT
SYR TB 1ML 27GX1/2 LL (SYRINGE) ×2 IMPLANT
TAPE TRANSPORE STRL 2 31045 (GAUZE/BANDAGES/DRESSINGS) ×2 IMPLANT
TIP BRUSH PULSAVAC PLUS 24.33 (MISCELLANEOUS) ×2 IMPLANT
TIP FAN IRRIG PULSAVAC PLUS (DISPOSABLE) ×1 IMPLANT

## 2019-11-11 NOTE — Progress Notes (Signed)
PROGRESS NOTE    Teresa Holmes  HMC:[id number] DOB: February 22, 1950 DOA: 11/10/2019 PCP: Ronnell Freshwater, NP   Chief Complain: Fall  Brief Narrative: Patient is 69 year old female with history of nonhemorrhagic CVA with residual left hemiplegia, COPD, coronary disease, paroxysmal A. fib on Coumadin, hypothyroidism who presents from home with complaints of fall several days ago and decided to present to the emergency department after persistent left hip pain.  She normally ambulates with a cane, lives alone and has a daytime caretaker.  Patient reported significant swelling of the left arm since the fall.  On presentation she was hypotensive, anemic with hemoglobin of 5.8, baseline of 7.  Chest x-ray showed several left rib fractures mainly in the fourth and sixth rib.  Left hip x-ray showed superiorly displaced subcapital  fracture of the left femoral neck.  Plan for ORIF today by orthopedics.  Assessment & Plan:   Principal Problem:   Hip fracture ([facility]) Active Problems:   Hypothyroidism   Closed rib fracture   Acute blood loss anemia   AF (paroxysmal atrial fibrillation) ([facility])   CVA, old, hemiparesis ([place])   Wound of skin   Fall/left femur fracture: Left hip x-ray showed superiorly displaced subcapital  fracture of the left femoral neck.  Orthopedics consulted and following.  Planning for ORIF PT/OT will be considered after surgery.  DVT prophylaxis will be ordered after surgery.  Left-sided rib fractures: Continue pain management, supportive care.  Incentive spirometry.  Left upper extremity skin tear/swelling: Ultrasound showed hematoma, no fractures or DVT.  Continue supportive care.  Continue wound care.  Acute blood loss normocytic anemia: Baseline hemoglobin is around 7.  Presented with hemoglobin of 5.8, most likely associated with blood loss from hip fracture.  She was given a unit of PRBC .  Monitor CBC  Paroxysmal A. fib: On Coumadin for anticoagulation.  Coumadin is on  hold.  She was also given 1 mg of vitamin K for reversal for preparation of surgery.  On metoprolol for rate control.  History of nonhemorrhagic CVA: Has left-sided residual hemiplegia.  Ambulates with a cane.  PT/OT will be consulted after surgery.  Most likely she will need a skilled nursing facility on discharge.  Hypothyroidism: Continue Synthyroid           DVT prophylaxis: SCD Code Status: Full Family Communication: Discussed with power of attorney on 11/11/19 on phone Status is: Inpatient  Remains inpatient appropriate because:Inpatient level of care appropriate due to severity of illness   Dispo: The patient is from: Home              Anticipated d/c is to: SNF              Anticipated d/c date is: 2 days              Patient currently is not medically stable to d/c.     Consultants: Ortho  Procedures:None  Antimicrobials:  Anti-infectives (From admission, onward)   Start     Dose/Rate Route Frequency Ordered Stop   11/11/19 0600  ceFAZolin (ANCEF) IVPB 2g/100 mL premix       Note to Pharmacy: In OR prior to surgery   2 g 200 mL/hr over 30 Minutes Intravenous 30 min pre-op 11/10/19 1811        Subjective: Patient seen and examined the bedside this morning.  Hemodynamically stable and comfortable during my evaluation.  Pain is well controlled by pain medications.  Does not have any specific complaints.  Waiting for the orthopedic procedure.  Objective: Vitals:   11/11/19 0715 11/11/19 0730 11/11/19 0746 11/11/19 0814  BP:  (!) 114/56  (!) 113/56  Pulse: (!) 58  66 67  Resp:   16 16  Temp:   98.5 F (36.9 C) 98.6 F (37 C)  TempSrc:   Oral Oral  SpO2: 100% 100% 100% 100%  Weight:      Height:        Intake/Output Summary (Last 24 hours) at 11/11/2019 0848 Last data filed at 11/11/2019 0801 Gross per 24 hour  Intake 880 ml  Output --  Net 880 ml   Filed Weights   11/10/19 1601  Weight: 37 kg    Examination:  General exam: Not in obvious  distress,debilitated/deconditioned HEENT:PERRL,Oral mucosa moist, Ear/Nose normal on gross exam Respiratory system: Bilateral equal air entry, normal vesicular breath sounds, no wheezes or crackles  Cardiovascular system: S1 & S2 heard, RRR. No JVD, murmurs, rubs, gallops or clicks. No pedal edema. Gastrointestinal system: Abdomen is nondistended, soft and nontender. No organomegaly or masses felt. Normal bowel sounds heard. Central nervous system: Alert and oriented.  Left-sided weakness Extremities: Edema/ecchymosis of the left upper extremity, left lower extremity externally rotated with  decreased range of  motion Skin: No  ulcers,no icterus ,no pallor   Data Reviewed: I have personally reviewed following labs and imaging studies  CBC: Recent Labs  Lab 11/10/19 1603 11/10/19 2245 11/11/19 0500  WBC 8.4  --  7.5  HGB 5.8* 7.5* 7.2*  HCT 19.8* 23.7* 22.9*  MCV 102.1*  --  97.0  PLT 544*  --  527*   Basic Metabolic Panel: Recent Labs  Lab 11/10/19 1603 11/11/19 0500  NA 133* 132*  K 3.3* 3.5  CL 98 98  CO2 25 26  GLUCOSE 115* 94  BUN 24* 19  CREATININE 0.64 0.47  CALCIUM 7.6* 7.4*   GFR: Estimated Creatinine Clearance: 38.2 mL/min (by C-G formula based on SCr of 0.47 mg/dL). Liver Function Tests: Recent Labs  Lab 11/10/19 1603  AST 40  ALT 19  ALKPHOS 160*  BILITOT 1.4*  PROT 5.4*  ALBUMIN 2.1*   No results for input(s): LIPASE, AMYLASE in the last 168 hours. No results for input(s): AMMONIA in the last 168 hours. Coagulation Profile: Recent Labs  Lab 11/10/19 1603 11/10/19 2245 11/11/19 0500  INR 2.2* 1.7* 1.3*   Cardiac Enzymes: No results for input(s): CKTOTAL, CKMB, CKMBINDEX, TROPONINI in the last 168 hours. BNP (last 3 results) No results for input(s): PROBNP in the last 8760 hours. HbA1C: No results for input(s): HGBA1C in the last 72 hours. CBG: No results for input(s): GLUCAP in the last 168 hours. Lipid Profile: No results for input(s):  CHOL, HDL, LDLCALC, TRIG, CHOLHDL, LDLDIRECT in the last 72 hours. Thyroid Function Tests: No results for input(s): TSH, T4TOTAL, FREET4, T3FREE, THYROIDAB in the last 72 hours. Anemia Panel: No results for input(s): VITAMINB12, FOLATE, FERRITIN, TIBC, IRON, RETICCTPCT in the last 72 hours. Sepsis Labs: No results for input(s): PROCALCITON, LATICACIDVEN in the last 168 hours.  Recent Results (from the past 240 hour(s))  Respiratory Panel by RT PCR (Flu A&B, Covid) - Nasopharyngeal Swab     Status: None   Collection Time: 11/10/19  4:44 PM   Specimen: Nasopharyngeal Swab  Result Value Ref Range Status   SARS Coronavirus 2 by RT PCR NEGATIVE NEGATIVE Final    Comment: (NOTE) SARS-CoV-2 target nucleic acids are NOT DETECTED.  The SARS-CoV-2 RNA is generally  detectable in upper respiratoy specimens during the acute phase of infection. The lowest concentration of SARS-CoV-2 viral copies this assay can detect is 131 copies/mL. A negative result does not preclude SARS-Cov-2 infection and should not be used as the sole basis for treatment or other patient management decisions. A negative result may occur with  improper specimen collection/handling, submission of specimen other than nasopharyngeal swab, presence of viral mutation(s) within the areas targeted by this assay, and inadequate number of viral copies (<131 copies/mL). A negative result must be combined with clinical observations, patient history, and epidemiological information. The expected result is Negative.  Fact Sheet for Patients:  PinkCheek.be  Fact Sheet for Healthcare Providers:  GravelBags.it  This test is no t yet approved or cleared by the [place] FDA and  has been authorized for detection and/or diagnosis of SARS-CoV-2 by FDA under an Emergency Use Authorization (EUA). This EUA will remain  in effect (meaning this test can be used) for the duration of  the COVID-19 declaration under Section 564(b)(1) of the Act, 21 U.S.C. section 360bbb-3(b)(1), unless the authorization is terminated or revoked sooner.     Influenza A by PCR NEGATIVE NEGATIVE Final   Influenza B by PCR NEGATIVE NEGATIVE Final    Comment: (NOTE) The Xpert Xpress SARS-CoV-2/FLU/RSV assay is intended as an aid in  the diagnosis of influenza from Nasopharyngeal swab specimens and  should not be used as a sole basis for treatment. Nasal washings and  aspirates are unacceptable for Xpert Xpress SARS-CoV-2/FLU/RSV  testing.  Fact Sheet for Patients: PinkCheek.be  Fact Sheet for Healthcare Providers: GravelBags.it  This test is not yet approved or cleared by the [place] FDA and  has been authorized for detection and/or diagnosis of SARS-CoV-2 by  FDA under an Emergency Use Authorization (EUA). This EUA will remain  in effect (meaning this test can be used) for the duration of the  Covid-19 declaration under Section 564(b)(1) of the Act, 21  U.S.C. section 360bbb-3(b)(1), unless the authorization is  terminated or revoked. Performed at [facility], [address], [place], [place] [address]          Radiology Studies: DG Chest 1 View  Result Date: 11/10/2019 CLINICAL DATA:  Fall 2 days ago.  Left arm wound. EXAM: CHEST  1 VIEW COMPARISON:  Chest radiographs 09/28/2018 and 01/18/2006. Abdominal radiographs 09/30/2018 FINDINGS: 1640 hours. Mild patient rotation to the left. The heart size and mediastinal contours are stable status post median sternotomy. The lungs appear clear. There is no pleural effusion or pneumothorax. There are several left rib fractures laterally which may be subacute, notably in the 4th and 6th ribs. Fractures of the 7th and 8th ribs may be acute. IMPRESSION: Several left-sided rib fractures, at least some likely subacute. No pleural effusion or pneumothorax.  Electronically Signed   By: Richardean Sale M.D.   On: 11/10/2019 17:40   DG Pelvis 1-2 Views  Result Date: 11/10/2019 CLINICAL DATA:  69 year old female with fall. EXAM: PELVIS - 1-2 VIEW COMPARISON:  Pelvic radiograph dated 09/28/2018. FINDINGS: There is a displaced fracture of the left femoral neck with proximal migration of the femoral shaft. No dislocation. There is advanced osteopenia. Moderate stool noted in the rectal vault. The soft tissues are unremarkable. IMPRESSION: Displaced fracture of the left femoral neck. Electronically Signed   By: Anner Crete M.D.   On: 11/10/2019 17:32   DG Forearm Left  Result Date: 11/10/2019 CLINICAL DATA:  69 year old female with fall and trauma  to the left upper extremity. EXAM: LEFT FOREARM - 2 VIEW COMPARISON:  None. FINDINGS: Evaluation is very limited due to advanced osteopenia. No definite acute fracture or dislocation. Old appearing fracture deformity of the distal radius. Clinical correlation is recommended. The bones are osteopenic. There is diffuse subcutaneous soft tissue edema. No radiopaque foreign object or soft tissue gas. IMPRESSION: 1. No definite acute fracture or dislocation. Old appearing fracture of the distal radius. 2. Diffuse subcutaneous soft tissue edema. Electronically Signed   By: Anner Crete M.D.   On: 11/10/2019 17:30   CT Head Wo Contrast  Result Date: 11/10/2019 CLINICAL DATA:  Fall 2 days ago EXAM: CT HEAD WITHOUT CONTRAST TECHNIQUE: Contiguous axial images were obtained from the base of the skull through the vertex without intravenous contrast. COMPARISON:  August 14, 2019 FINDINGS: Brain: No evidence of acute infarction, hemorrhage, hydrocephalus, extra-axial collection or mass lesion/mass effect. Sequela of large RIGHT MCA infarction with encephalomalacia and cortical gliosis involving the RIGHT temporal and frontoparietal lobes. Periventricular white matter hypodensities consistent with sequela of chronic microvascular  ischemic disease. Remote RIGHT cerebellar infarction. Vascular: No hyperdense vessel or unexpected calcification. Skull: Normal. Negative for fracture or focal lesion. Sinuses/Orbits: No acute finding. Other: None. IMPRESSION: 1.  No acute intracranial abnormality. Electronically Signed   By: Valentino Saxon MD   On: 11/10/2019 17:24   US Venous Img Upper Uni Left  Result Date: 11/10/2019 CLINICAL DATA:  Fall, redness and swelling of the left upper extremity EXAM: LEFT UPPER EXTREMITY VENOUS DOPPLER ULTRASOUND TECHNIQUE: Gray-scale sonography with graded compression, as well as color Doppler and duplex ultrasound were performed to evaluate the upper extremity deep venous system from the level of the subclavian vein and including the jugular, axillary, basilic, radial, ulnar and upper cephalic vein. Spectral Doppler was utilized to evaluate flow at rest and with distal augmentation maneuvers. COMPARISON:  None. FINDINGS: Contralateral Subclavian Vein: Respiratory phasicity is normal and symmetric with the symptomatic side. No evidence of thrombus. Normal compressibility. Internal Jugular Vein: No evidence of thrombus. Normal compressibility, respiratory phasicity and response to augmentation. Subclavian Vein: No evidence of thrombus. Normal compressibility, respiratory phasicity and response to augmentation. Axillary Vein: No evidence of thrombus. Normal compressibility, respiratory phasicity and response to augmentation. Cephalic Vein: No evidence of thrombus. Normal compressibility, respiratory phasicity and response to augmentation. Basilic Vein: No evidence of thrombus. Normal compressibility, respiratory phasicity and response to augmentation. Brachial Veins: No evidence of thrombus. Normal compressibility, respiratory phasicity and response to augmentation. Radial Veins: No evidence of thrombus. Normal compressibility, respiratory phasicity and response to augmentation. Ulnar Veins: No evidence of  thrombus. Normal compressibility, respiratory phasicity and response to augmentation. Venous Reflux:  None visualized. Other Findings: There is a hypoechoic collection seen in the region of the left axilla at the level of though separate from the axillary vein which could reflect a hematoma or seroma though is overall indeterminate on these ultrasound images. Additionally, there is diffuse soft tissue edema of the left upper extremity most pronounced at the level of the upper arm. IMPRESSION: 1. No evidence of DVT within the left upper extremity. 2. Hypoechoic collection in the region of the left axilla measuring 2.9 by 1.0 cm in transverse dimensions (image 12/37) could reflect a hematoma in the setting of trauma though is indeterminate on these ultrasound images. Could consider further evaluation with cross-sectional imaging. 3. Diffuse soft tissue edema of the left upper extremity, most pronounced at the level of the upper arm. Electronically Signed  By: Lovena Le M.D.   On: 11/10/2019 17:55   DG Femur Min 2 Views Left  Result Date: 11/10/2019 CLINICAL DATA:  Fall 2 days ago.  Hip deformity. EXAM: LEFT FEMUR 2 VIEWS COMPARISON:  Pelvic radiographs same date. One view abdomen 07/15/2019. FINDINGS: The bones are diffusely demineralized. There is a superiorly displaced subcapital fracture of the left femoral neck. The fracture margins are somewhat ill-defined, and this could be subacute in age. The distal femur is intact. The visualized bony pelvis is intact. Mild degenerative changes are present at the left hip and knee. There are vascular clips in the left groin and medial thigh. IMPRESSION: Superiorly displaced subcapital fracture of the left femoral neck, possibly subacute in age. Electronically Signed   By: Richardean Sale M.D.   On: 11/10/2019 17:36        Scheduled Meds: . atorvastatin  40 mg Oral Daily  . fenofibrate  160 mg Oral Daily  . levothyroxine  50 mcg Oral QAC breakfast  .  metoprolol tartrate  12.5 mg Oral BID   Continuous Infusions: . sodium chloride Stopped (11/10/19 1820)  .  ceFAZolin (ANCEF) IV    . phytonadione (VITAMIN K) IV       LOS: 1 day    Time spent: More than 50% of that time was spent in counseling and/or coordination of care.      Shelly Coss, MD [facility] P10/11/2019, 8:48 AM

## 2019-11-11 NOTE — Anesthesia Postprocedure Evaluation (Signed)
Anesthesia Post Note  Patient: Teresa Holmes  Procedure(s) Performed: ARTHROPLASTY BIPOLAR HIP (HEMIARTHROPLASTY) (Left Hip)  Patient location during evaluation: PACU Anesthesia Type: General Level of consciousness: awake and alert Pain management: pain level controlled Vital Signs Assessment: post-procedure vital signs reviewed and stable Respiratory status: spontaneous breathing, nonlabored ventilation, respiratory function stable and patient connected to nasal cannula oxygen Cardiovascular status: blood pressure returned to baseline and stable Postop Assessment: no apparent nausea or vomiting Anesthetic complications: no   No complications documented.   Last Vitals:  Vitals:   11/11/19 1513 11/11/19 1551  BP: (!) 164/95 (!) 139/97  Pulse: 97 92  Resp: 20 17  Temp: 36.7 C 36.9 C  SpO2: 98% 93%    Last Pain:  Vitals:   11/11/19 1605  TempSrc:   PainSc: 6                  Arita Miss

## 2019-11-11 NOTE — Op Note (Signed)
11/10/2019 - 11/11/2019  1:55 PM  Patient:   Teresa Holmes  Pre-Op Diagnosis:   Displaced femoral neck fracture, left hip.  Post-Op Diagnosis:   Same with greater trochanter fracture.  Procedure:   Left hip unipolar hemiarthroplasty with repair of left greater trochanter fracture.  Surgeon:   Pascal Lux, MD  Assistant:   Rachelle Hora, PA-C  Anesthesia:   GET  Findings:   As above.  Complications:   None  EBL:   100 cc  Fluids:   800 cc crystalloid  UOP:   None  TT:   None  Drains:   None  Closure:   Staples  Implants:   Biomet press-fit system with a #11 standard offset reduced proximal profile Echo femoral stem, a 41 mm outer diameter shell, and a -6 mm neck adapter.  Brief Clinical Note:   The patient is a 69 year old female who sustained the above-noted injury several days ago when she apparently fell in her home. She did not seek treatment immediately, but because of persistent inability to ambulate, she was brought to the emergency room yesterday where x-rays demonstrated the above-noted injury. The patient has been cleared medically and presents at this time for definitive management of the injury.  Procedure:   The patient was brought into the operating room and lain in the supine position. After adequate general endotracheal intubation and anesthesia was obtained, the patient was repositioned in the right lateral decubitus position and secured using a lateral hip positioner. The left hip and lower extremity were prepped with ChloroPrep solution before being draped sterilely. Preoperative antibiotics were administered. A timeout was performed to verify the appropriate surgical site.    A standard posterior approach to the hip was made through an approximately 4-5 inch incision. The incision was carried down through the subcutaneous tissues to expose the gluteal fascia and proximal end of the iliotibial band. These structures were split the length of the incision  and the Charnley self-retaining hip retractor placed. The bursal tissues were swept posteriorly to expose the short external rotators. At this point, it was noted that the greater trochanter had a transverse fracture through it, although the hip abductors and vastus lateralis fibers remained intact. Therefore care was maintained throughout the case to avoid putting undue traction on this area. The anterior border of the piriformis tendon was identified and this plane developed down through the capsule to enter the joint. Abundant fracture hematoma was suctioned. A flap of tissue was elevated off the posterior aspect of the femoral neck and greater trochanter and retracted posteriorly. This flap included the piriformis tendon, the short external rotators, and the posterior capsule. The femoral head was removed in its entirety, then taken to the back table where it was measured and found to be optimally replicated by a 41 mm head. The appropriate trial head was inserted and found to demonstrate an excellent suction fit.   Attention was directed to the femoral side. The femoral neck was recut 10-12 mm above the lesser trochanter using an oscillating saw. Because of the greater trochanteric fracture, the femoral canal was readily accessed. The canal was reamed sequentially beginning with a #7 tapered reamer and progressing to a #11 tapered reamer. This provided excellent circumferential chatter. The canal was broached sequentially beginning with a #8 broach and progressing to a #11 broach. This was left in place and several trial reductions performed. The permanent #11 reduced proximal profile femoral stem was impacted into place. A repeat trial reduction  was performed using the -6 mm neck length. The -6 mm neck length demonstrated excellent stability both in extension and external rotation as well as with flexion to 90 and internal rotation beyond 65. It also was stable in the position of sleep. The 41 mm outer  diameter shell with the -6 mm neck adapter construct was put together on the back table before being impacted onto the stem of the femoral component. The Morse taper locking mechanism was verified using manual distraction before the head was relocated and the hip placed through a range of motion with the findings as described above.  At this point, the two #2 FiberWires which had been placed through the greater tuberosity fragment and into the lateral aspect of the proximal femur were tied to better stabilize the greater trochanteric fracture fragment.  The wound was copiously irrigated with bacitracin saline solution via the jet lavage system before the peri-incisional and pericapsular tissues were injected with 30 cc of 0.5% Sensorcaine with epinephrine and 20 cc of Exparel diluted out to 60 cc with normal saline to help with postoperative analgesia. The posterior flap was reapproximated to the posterior aspect of the greater trochanter using #2 Tycron interrupted sutures placed through drill holes. The iliotibial band was reapproximated using #1 Vicryl interrupted sutures before the gluteal fascia was closed using a running #1 Vicryl suture. At this point, 1 g of transexemic acid in 10 cc of normal saline was injected into the joint to help reduce postoperative bleeding. The subcutaneous tissues were closed in several layers using 2-0 Vicryl interrupted sutures before the skin was closed using staples. A sterile occlusive dressing was applied to the wound . The patient then was rolled back into the supine position on the hospital bed before being awakened, extubated, and returned to the recovery room in satisfactory condition after tolerating the procedure well.

## 2019-11-11 NOTE — ED Notes (Signed)
Laceration on left forearm redressed with gauze. No bleeding noted at this time. Left arm elevated above heart on pillow. Sensation intact, capillary refill <3 seconds.

## 2019-11-11 NOTE — ED Notes (Signed)
Blood bank called to inform RN, blood for transfusion is ready.

## 2019-11-11 NOTE — Anesthesia Procedure Notes (Signed)
Procedure Name: Intubation Date/Time: 11/11/2019 11:55 AM Performed by: Esaw Grandchild, CRNA Pre-anesthesia Checklist: Patient identified, Emergency Drugs available, Suction available and Patient being monitored Patient Re-evaluated:Patient Re-evaluated prior to induction Oxygen Delivery Method: Circle system utilized Preoxygenation: Pre-oxygenation with 100% oxygen Induction Type: IV induction Ventilation: Mask ventilation without difficulty Laryngoscope Size: Miller and 2 Grade View: Grade I Tube type: Oral Tube size: 6.5 mm Number of attempts: 1 Airway Equipment and Method: Stylet and Oral airway Placement Confirmation: ETT inserted through vocal cords under direct vision,  positive ETCO2 and breath sounds checked- equal and bilateral Secured at: 18 cm Tube secured with: Tape Dental Injury: Teeth and Oropharynx as per pre-operative assessment

## 2019-11-11 NOTE — ED Notes (Signed)
Pt to OR.

## 2019-11-11 NOTE — ED Notes (Signed)
Pt repositioned by this RN.

## 2019-11-11 NOTE — ED Notes (Signed)
Please contact Ree Edman after surgery. Info in demographics

## 2019-11-11 NOTE — ED Notes (Signed)
Pt given ginger ale and repositioned per request.

## 2019-11-11 NOTE — Consult Note (Addendum)
[facility] for Warfarin Indication: CVA  Allergies  Allergen Reactions  . Aspirin Other (See Comments)    ulcer  . Sulfa Antibiotics Other (See Comments)    unknown    Patient Measurements: Height: 4\' 9"  (144.8 cm) Weight: 37 kg (81 lb 9.1 oz) IBW/kg (Calculated) : 38.6  Vital Signs: Temp: 98.6 F (37 C) (10/10 1040) Temp Source: Oral (10/10 1040) BP: 146/61 (10/10 1100) Pulse Rate: 73 (10/10 1045)  Labs: Recent Labs    11/10/19 1603 11/10/19 1603 11/10/19 2245 11/11/19 0500 11/11/19 1050  HGB 5.8*   < > 7.5* 7.2*  --   HCT 19.8*  --  23.7* 22.9*  --   PLT 544*  --   --  415*  --   LABPROT 23.7*   < > 19.4* 16.0* 14.9  INR 2.2*   < > 1.7* 1.3* 1.2  CREATININE 0.64  --   --  0.47  --    < > = values in this interval not displayed.    Estimated Creatinine Clearance: 38.2 mL/min (by C-G formula based on SCr of 0.47 mg/dL).   Medical History: Past Medical History:  Diagnosis Date  . Aneurysm ([place])   . Anxiety   . Arthritis   . Cancer ([place])   . CHF (congestive heart failure) ([place])   . COPD (chronic obstructive pulmonary disease) ([place])   . Hyperlipidemia   . Hypertension   . Stroke ([place])   . Thyroid disease     Medications:  PTA warfarin 1mg  daily  Assessment: 69yo female presented with L hip pain. PMH COPD, anemia, CHF, HLD, HTN, hypothyroidism, and left hemiparesis following a stroke (on warfarin). Patient's last dose of warfarin was her home dose of 1mg  on 10/8. Patient received IV vitamin K 1mg  x2 doses in anticipation for procedure (arthroplasty bipolar hip) and warfarin was held. Pharmacy has been consulted for warfarin dosing and monitoring.   Hgb 7.2 s/p 2units pRBCs (based on previous labs, baseline Hgb seems to be in 7's) Plt 544>415  Date INR  Dose 10/9 1.7>1.3 Held 10/10 1.2   Goal of Therapy:  INR 2-3 Monitor platelets by anticoagulation protocol: Yes   Plan:  INR subtherapeutic. Patient  procedure concluded ~1400; typically wait 12-24 hours to restart warfarin after procedure. D/w ortho, okay to restart warfarin tonight. Will give warfarin 2mg  tonight Monitor daily INR and CBC   Sherilyn Banker, PharmD Pharmacy Resident  11/11/2019 11:53 AM

## 2019-11-11 NOTE — ED Notes (Addendum)
Pt repositioned flat. TV adjusted per pt request. Left hand capillary refill is <3 seconds, sensation intact.

## 2019-11-11 NOTE — Transfer of Care (Signed)
Immediate Anesthesia Transfer of Care Note  Patient: Teresa Holmes  Procedure(s) Performed: ARTHROPLASTY BIPOLAR HIP (HEMIARTHROPLASTY) (Left Hip)  Patient Location: PACU  Anesthesia Type:General  Level of Consciousness: drowsy  Airway & Oxygen Therapy: Patient Spontanous Breathing and Patient connected to face mask oxygen  Post-op Assessment: Report given to RN and Post -op Vital signs reviewed and stable  Post vital signs: Reviewed and stable  Last Vitals:  Vitals Value Taken Time  BP 162/88 11/11/19 1355  Temp 36.3 C 11/11/19 1355  Pulse 84 11/11/19 1403  Resp 12 11/11/19 1403  SpO2 100 % 11/11/19 1403  Vitals shown include unvalidated device data.  Last Pain:  Vitals:   11/11/19 1040  TempSrc: Oral  PainSc:          Complications: No complications documented.

## 2019-11-11 NOTE — ED Notes (Signed)
CMS intact in upper and lower extremities. Capillary refill <3 seconds in left and and foot.

## 2019-11-11 NOTE — Progress Notes (Signed)
15 minute call to floor. 

## 2019-11-11 NOTE — Consult Note (Addendum)
ORTHOPAEDIC CONSULTATION  REQUESTING PHYSICIAN: Shelly Coss, MD  Chief Complaint:   Left hip pain.  History of Present Illness: Teresa Holmes is a 69 y.o. female with a history of anemia, COPD, congestive heart failure, hyperlipidemia, hypertension, hypothyroidism, and left hemiparesis following a stroke (for which she is on warfarin) who lives independently but has a caretaker who looks in on her.  The patient normally ambulates with a cane.  Apparently she tripped and fell going to the bathroom several days ago, landing on her left hip.  She was unable to get up.  She was found by her caretaker and helped back to bed.  However, because of persistent inability to ambulate, she was brought to the emergency room last evening where x-rays demonstrated a displaced left femoral neck fracture.  The patient also complains of left arm and left chest wall pain resulting from the fall.  X-rays of these areas demonstrated several rib fractures but no bony injury to the left forearm.  However, the patient denies any lightheadedness, dizziness, chest pain, shortness of breath, or other symptoms which may have precipitated her fall.  Past Medical History:  Diagnosis Date  . Aneurysm ([place])   . Anxiety   . Arthritis   . Cancer ([place])   . CHF (congestive heart failure) ([place])   . COPD (chronic obstructive pulmonary disease) ([place])   . Hyperlipidemia   . Hypertension   . Stroke ([place])   . Thyroid disease    Past Surgical History:  Procedure Laterality Date  . ABDOMINAL HYSTERECTOMY    . AORTIC/RENAL BYPASS    . bypass graft    . CHOLECYSTECTOMY    . CORONARY ARTERY BYPASS GRAFT     Social History   Socioeconomic History  . Marital status: Married    Spouse name: Not on file  . Number of children: Not on file  . Years of education: Not on file  . Highest education level: Not on file  Occupational History  . Not on file  Tobacco  Use  . Smoking status: Current Every Day Smoker    Types: Cigarettes  . Smokeless tobacco: Never Used  Vaping Use  . Vaping Use: Never used  Substance and Sexual Activity  . Alcohol use: No  . Drug use: Never  . Sexual activity: Not on file  Other Topics Concern  . Not on file  Social History Narrative  . Not on file   Social Determinants of Health   Financial Resource Strain:   . Difficulty of Paying Living Expenses: Not on file  Food Insecurity:   . Worried About Charity fundraiser in the Last Year: Not on file  . Ran Out of Food in the Last Year: Not on file  Transportation Needs:   . Lack of Transportation (Medical): Not on file  . Lack of Transportation (Non-Medical): Not on file  Physical Activity:   . Days of Exercise per Week: Not on file  . Minutes of Exercise per Session: Not on file  Stress:   . Feeling of Stress : Not on file  Social Connections:   . Frequency of Communication with Friends and Family: Not on file  . Frequency of Social Gatherings with Friends and Family: Not on file  . Attends Religious Services: Not on file  . Active Member of Clubs or Organizations: Not on file  . Attends Archivist Meetings: Not on file  . Marital Status: Not on file   Family History  Problem  Relation Age of Onset  . Cancer Mother    Allergies  Allergen Reactions  . Aspirin Other (See Comments)    ulcer  . Sulfa Antibiotics Other (See Comments)    unknown   Prior to Admission medications   Medication Sig Start Date End Date Taking? Authorizing Provider  atorvastatin (LIPITOR) 40 MG tablet Take 1 tablet (40 mg total) by mouth daily. 09/10/19  Yes Scarboro, Audie Clear, NP  fenofibrate (TRICOR) 145 MG tablet Take 1 tablet (145 mg total) by mouth daily. 09/10/19  Yes Scarboro, Audie Clear, NP  furosemide (LASIX) 20 MG tablet 20 MG FOR 3 DAYS THEN ONE EVERY OTHER DAY 08/02/19  Yes Lavera Guise, MD  levothyroxine (SYNTHROID) 50 MCG tablet Take 1 tablet (50 mcg total) by  mouth daily before breakfast. 10/09/19  Yes Boscia, Heather E, NP  metoprolol tartrate (LOPRESSOR) 25 MG tablet Take 0.5 tablets (12.5 mg total) by mouth 2 (two) times daily. 09/20/19  Yes Lavera Guise, MD  warfarin (COUMADIN) 1 MG tablet Take 1 mg by mouth daily.   Yes [provider]   DG Chest 1 View  Result Date: 11/10/2019 CLINICAL DATA:  Fall 2 days ago.  Left arm wound. EXAM: CHEST  1 VIEW COMPARISON:  Chest radiographs 09/28/2018 and 01/18/2006. Abdominal radiographs 09/30/2018 FINDINGS: 1640 hours. Mild patient rotation to the left. The heart size and mediastinal contours are stable status post median sternotomy. The lungs appear clear. There is no pleural effusion or pneumothorax. There are several left rib fractures laterally which may be subacute, notably in the 4th and 6th ribs. Fractures of the 7th and 8th ribs may be acute. IMPRESSION: Several left-sided rib fractures, at least some likely subacute. No pleural effusion or pneumothorax. Electronically Signed   By: Richardean Sale M.D.   On: 11/10/2019 17:40   DG Pelvis 1-2 Views  Result Date: 11/10/2019 CLINICAL DATA:  69 year old female with fall. EXAM: PELVIS - 1-2 VIEW COMPARISON:  Pelvic radiograph dated 09/28/2018. FINDINGS: There is a displaced fracture of the left femoral neck with proximal migration of the femoral shaft. No dislocation. There is advanced osteopenia. Moderate stool noted in the rectal vault. The soft tissues are unremarkable. IMPRESSION: Displaced fracture of the left femoral neck. Electronically Signed   By: Anner Crete M.D.   On: 11/10/2019 17:32   DG Forearm Left  Result Date: 11/10/2019 CLINICAL DATA:  69 year old female with fall and trauma to the left upper extremity. EXAM: LEFT FOREARM - 2 VIEW COMPARISON:  None. FINDINGS: Evaluation is very limited due to advanced osteopenia. No definite acute fracture or dislocation. Old appearing fracture deformity of the distal radius. Clinical correlation is  recommended. The bones are osteopenic. There is diffuse subcutaneous soft tissue edema. No radiopaque foreign object or soft tissue gas. IMPRESSION: 1. No definite acute fracture or dislocation. Old appearing fracture of the distal radius. 2. Diffuse subcutaneous soft tissue edema. Electronically Signed   By: Anner Crete M.D.   On: 11/10/2019 17:30   CT Head Wo Contrast  Result Date: 11/10/2019 CLINICAL DATA:  Fall 2 days ago EXAM: CT HEAD WITHOUT CONTRAST TECHNIQUE: Contiguous axial images were obtained from the base of the skull through the vertex without intravenous contrast. COMPARISON:  August 14, 2019 FINDINGS: Brain: No evidence of acute infarction, hemorrhage, hydrocephalus, extra-axial collection or mass lesion/mass effect. Sequela of large RIGHT MCA infarction with encephalomalacia and cortical gliosis involving the RIGHT temporal and frontoparietal lobes. Periventricular white matter hypodensities consistent with sequela of  chronic microvascular ischemic disease. Remote RIGHT cerebellar infarction. Vascular: No hyperdense vessel or unexpected calcification. Skull: Normal. Negative for fracture or focal lesion. Sinuses/Orbits: No acute finding. Other: None. IMPRESSION: 1.  No acute intracranial abnormality. Electronically Signed   By: Valentino Saxon MD   On: 11/10/2019 17:24   US Venous Img Upper Uni Left  Result Date: 11/10/2019 CLINICAL DATA:  Fall, redness and swelling of the left upper extremity EXAM: LEFT UPPER EXTREMITY VENOUS DOPPLER ULTRASOUND TECHNIQUE: Gray-scale sonography with graded compression, as well as color Doppler and duplex ultrasound were performed to evaluate the upper extremity deep venous system from the level of the subclavian vein and including the jugular, axillary, basilic, radial, ulnar and upper cephalic vein. Spectral Doppler was utilized to evaluate flow at rest and with distal augmentation maneuvers. COMPARISON:  None. FINDINGS: Contralateral Subclavian Vein:  Respiratory phasicity is normal and symmetric with the symptomatic side. No evidence of thrombus. Normal compressibility. Internal Jugular Vein: No evidence of thrombus. Normal compressibility, respiratory phasicity and response to augmentation. Subclavian Vein: No evidence of thrombus. Normal compressibility, respiratory phasicity and response to augmentation. Axillary Vein: No evidence of thrombus. Normal compressibility, respiratory phasicity and response to augmentation. Cephalic Vein: No evidence of thrombus. Normal compressibility, respiratory phasicity and response to augmentation. Basilic Vein: No evidence of thrombus. Normal compressibility, respiratory phasicity and response to augmentation. Brachial Veins: No evidence of thrombus. Normal compressibility, respiratory phasicity and response to augmentation. Radial Veins: No evidence of thrombus. Normal compressibility, respiratory phasicity and response to augmentation. Ulnar Veins: No evidence of thrombus. Normal compressibility, respiratory phasicity and response to augmentation. Venous Reflux:  None visualized. Other Findings: There is a hypoechoic collection seen in the region of the left axilla at the level of though separate from the axillary vein which could reflect a hematoma or seroma though is overall indeterminate on these ultrasound images. Additionally, there is diffuse soft tissue edema of the left upper extremity most pronounced at the level of the upper arm. IMPRESSION: 1. No evidence of DVT within the left upper extremity. 2. Hypoechoic collection in the region of the left axilla measuring 2.9 by 1.0 cm in transverse dimensions (image 12/37) could reflect a hematoma in the setting of trauma though is indeterminate on these ultrasound images. Could consider further evaluation with cross-sectional imaging. 3. Diffuse soft tissue edema of the left upper extremity, most pronounced at the level of the upper arm. Electronically Signed   By: Lovena Le M.D.   On: 11/10/2019 17:55   DG Femur Min 2 Views Left  Result Date: 11/10/2019 CLINICAL DATA:  Fall 2 days ago.  Hip deformity. EXAM: LEFT FEMUR 2 VIEWS COMPARISON:  Pelvic radiographs same date. One view abdomen 07/15/2019. FINDINGS: The bones are diffusely demineralized. There is a superiorly displaced subcapital fracture of the left femoral neck. The fracture margins are somewhat ill-defined, and this could be subacute in age. The distal femur is intact. The visualized bony pelvis is intact. Mild degenerative changes are present at the left hip and knee. There are vascular clips in the left groin and medial thigh. IMPRESSION: Superiorly displaced subcapital fracture of the left femoral neck, possibly subacute in age. Electronically Signed   By: Richardean Sale M.D.   On: 11/10/2019 17:36    Positive ROS: All other systems have been reviewed and were otherwise negative with the exception of those mentioned in the HPI and as above.  Physical Exam: General:  Alert, no acute distress Psychiatric:  Patient is competent for  consent with normal mood and affect   Cardiovascular:  No pedal edema Respiratory:  No wheezing, non-labored breathing GI:  Abdomen is soft and non-tender Skin:  No lesions in the area of chief complaint Neurologic:  Sensation intact distally Lymphatic:  No axillary or cervical lymphadenopathy  Orthopedic Exam:  Orthopedic examination is limited to the left hip and lower extremity. The left lower extremity is noticeably shortened and externally rotated as compared to the right lower extremity. Skin inspection around the hip is notable for moderate swelling, but otherwise is unremarkable. No erythema, ecchymosis, or other skin abnormalities are identified. She has mild tenderness to palpation over the lateral aspect of the hip. She has more severe pain with any attempted active or passive motion of the hip.  Sensation is intact to light touch to all distributions. She has  little if any motor control over the left lower extremity due to her prior stroke. She has good capillary refill to her left foot.  X-rays:  X-rays of the pelvis and left femur are available for review and have been reviewed by myself.  The findings are as described above.  No significant degenerative changes are noted.  Assessment: Displaced left femoral neck fracture.  Plan: The treatment options, including both surgical and nonsurgical choices, have been discussed in detail with the patient. The patient would like to proceed with surgical intervention to include a left hip hemiarthroplasty. The risks (including bleeding, infection, nerve and/or blood vessel injury, persistent or recurrent pain, loosening or failure of the components, leg length inequality, dislocation, need for further surgery, blood clots, strokes, heart attacks or arrhythmias, pneumonia, etc.) and benefits of the surgical procedure were discussed. The patient states her understanding and agrees to proceed.  She also agrees to a blood transfusion if necessary. A formal written consent has been obtained.  The patient will be taken to the operating room later today assuming her INR has been reversed satisfactorily and that her hemoglobin is above 7.0 as per the recommendation of the hospitalist.  Thank you for asking me to participate in the care of this most unfortunate woman.  I will be happy to follow her with you.   Pascal Lux, MD  Beeper #:  [phone]  11/11/2019 10:37 AM

## 2019-11-11 NOTE — ED Notes (Signed)
Per Dr. Lynnae Sandhoff Vitamin K infusion, infusion stopped.

## 2019-11-12 ENCOUNTER — Encounter: Payer: Self-pay | Admitting: Surgery

## 2019-11-12 LAB — TYPE AND SCREEN
ABO/RH(D): A POS
Antibody Screen: NEGATIVE
Unit division: 0
Unit division: 0

## 2019-11-12 LAB — BASIC METABOLIC PANEL
Anion gap: 8 (ref 5–15)
BUN: 18 mg/dL (ref 8–23)
CO2: 22 mmol/L (ref 22–32)
Calcium: 7.7 mg/dL — ABNORMAL LOW (ref 8.9–10.3)
Chloride: 102 mmol/L (ref 98–111)
Creatinine, Ser: 0.69 mg/dL (ref 0.44–1.00)
GFR, Estimated: >60 mL/min (ref >60)
Glucose, Bld: 88 mg/dL (ref 70–99)
Potassium: 3.8 mmol/L (ref 3.5–5.1)
Sodium: 132 mmol/L — ABNORMAL LOW (ref 135–145)

## 2019-11-12 LAB — CBC WITH DIFFERENTIAL/PLATELET
Abs Immature Granulocytes: 0.05 10*3/uL (ref 0.00–0.07)
Basophils Absolute: 0 10*3/uL (ref 0.0–0.1)
Basophils Relative: 0 %
Eosinophils Absolute: 0 10*3/uL (ref 0.0–0.5)
Eosinophils Relative: 0 %
HCT: 26.9 % — ABNORMAL LOW (ref 36.0–46.0)
Hemoglobin: 9.1 g/dL — ABNORMAL LOW (ref 12.0–15.0)
Immature Granulocytes: 1 %
Lymphocytes Relative: 12 %
Lymphs Abs: 1.3 10*3/uL (ref 0.7–4.0)
MCH: 31.3 pg (ref 26.0–34.0)
MCHC: 33.8 g/dL (ref 30.0–36.0)
MCV: 92.4 fL (ref 80.0–100.0)
Monocytes Absolute: 0.5 10*3/uL (ref 0.1–1.0)
Monocytes Relative: 5 %
Neutro Abs: 8.5 10*3/uL — ABNORMAL HIGH (ref 1.7–7.7)
Neutrophils Relative %: 82 %
Platelets: 298 10*3/uL (ref 150–400)
RBC: 2.91 MIL/uL — ABNORMAL LOW (ref 3.87–5.11)
RDW: 18.3 % — ABNORMAL HIGH (ref 11.5–15.5)
WBC: 10.4 10*3/uL (ref 4.0–10.5)
nRBC: 0 % (ref 0.0–0.2)

## 2019-11-12 LAB — BPAM RBC
Blood Product Expiration Date: 202111012359
Blood Product Expiration Date: 202111032359
ISSUE DATE / TIME: 202110091850
ISSUE DATE / TIME: 202110100737
Unit Type and Rh: 6200
Unit Type and Rh: 6200

## 2019-11-12 LAB — PROTIME-INR
INR: 1.2 (ref 0.8–1.2)
Prothrombin Time: 14.7 seconds (ref 11.4–15.2)

## 2019-11-12 MED ORDER — WARFARIN SODIUM 2 MG PO TABS
2.0000 mg | ORAL_TABLET | Freq: Once | ORAL | Status: AC
  Administered 2019-11-12: 2 mg via ORAL
  Filled 2019-11-12: qty 1

## 2019-11-12 NOTE — Progress Notes (Signed)
  Subjective: 1 Day Post-Op Procedure(s) (LRB): ARTHROPLASTY BIPOLAR HIP (HEMIARTHROPLASTY) (Left) Patient reports pain as mild.   Patient is well, and has had no acute complaints or problems Patient will need SNF upon discharge, patient has history of left hemiparesis following a stroke. Negative for chest pain and shortness of breath Fever: no Gastrointestinal:Negative for nausea and vomiting  Objective: Vital signs in last 24 hours: Temp:  [97.4 F (36.3 C)-99 F (37.2 C)] 99 F (37.2 C) (10/11 0738) Pulse Rate:  [54-97] 64 (10/11 0738) Resp:  [10-20] 17 (10/11 0738) BP: (113-168)/(56-122) 147/63 (10/11 0738) SpO2:  [93 %-100 %] 97 % (10/11 0738)  Intake/Output from previous day:  Intake/Output Summary (Last 24 hours) at 11/12/2019 0801 Last data filed at 11/12/2019 0700 Gross per 24 hour  Intake 1344.01 ml  Output 365 ml  Net 979.01 ml    Intake/Output this shift: No intake/output data recorded.  Labs: Recent Labs    11/10/19 1603 11/10/19 2245 11/11/19 0500 11/12/19 0331  HGB 5.8* 7.5* 7.2* 9.1*   Recent Labs    11/11/19 0500 11/12/19 0331  WBC 7.5 10.4  RBC 2.36* 2.91*  HCT 22.9* 26.9*  PLT 415* 298   Recent Labs    11/11/19 0500 11/12/19 0331  NA 132* 132*  K 3.5 3.8  CL 98 102  CO2 26 22  BUN 19 18  CREATININE 0.47 0.69  GLUCOSE 94 88  CALCIUM 7.4* 7.7*   Recent Labs    11/11/19 1050 11/12/19 0331  INR 1.2 1.2     EXAM General - Patient is Alert, Appropriate and Oriented Extremity - ABD soft Sensation intact distally Intact pulses distally Incision: scant drainage No cellulitis present  Patient is unable to perform any active dorsiflexion or plantarflexion to the left foot but sensation is intact. Dressing/Incision - blood tinged drainage Motor Function - intact, moving foot and toes well on exam.  Abdomen soft to palpation.  Past Medical History:  Diagnosis Date  . Aneurysm ([place])   . Anxiety   . Arthritis   . Cancer  ([place])   . CHF (congestive heart failure) ([place])   . COPD (chronic obstructive pulmonary disease) ([place])   . Hyperlipidemia   . Hypertension   . Stroke ([place])   . Thyroid disease     Assessment/Plan: 1 Day Post-Op Procedure(s) (LRB): ARTHROPLASTY BIPOLAR HIP (HEMIARTHROPLASTY) (Left) Principal Problem:   Hip fracture ([facility]) Active Problems:   Hypothyroidism   Closed rib fracture   Acute blood loss anemia   AF (paroxysmal atrial fibrillation) ([facility])   CVA, old, hemiparesis ([place])   Wound of skin  Estimated body mass index is 17.65 kg/m as calculated from the following:   Height as of this encounter: 4\' 9"  (1.448 m).   Weight as of this encounter: 37 kg. Advance diet Up with therapy   Labs reviewed this AM. Hg 9.1 this morning.  No recent fevers. Up with therapy, patient with history of left hemiparesis Bedding changed this AM, seemed to be saturated with urine.  Honeycomb dressing intact. Begin working on BM. Will need SNF upon discharge.  DVT Prophylaxis - Lovenox, Coumadin and Foot Pumps Weight-Bearing as tolerated to left leg  J. Cameron Proud, PA-C [facility] Orthopaedic Surgery 11/12/2019, 8:01 AM

## 2019-11-12 NOTE — Plan of Care (Signed)
No acute events overnight.   Problem: Education: Goal: Knowledge of General Education information will improve Description: Including pain rating scale, medication(s)/side effects and non-pharmacologic comfort measures Outcome: Progressing   Problem: Health Behavior/Discharge Planning: Goal: Ability to manage health-related needs will improve Outcome: Progressing   Problem: Clinical Measurements: Goal: Ability to maintain clinical measurements within normal limits will improve Outcome: Progressing Goal: Will remain free from infection Outcome: Progressing Goal: Diagnostic test results will improve Outcome: Progressing Goal: Respiratory complications will improve Outcome: Progressing Goal: Cardiovascular complication will be avoided Outcome: Progressing   Problem: Activity: Goal: Risk for activity intolerance will decrease Outcome: Progressing   Problem: Nutrition: Goal: Adequate nutrition will be maintained Outcome: Progressing   Problem: Coping: Goal: Level of anxiety will decrease Outcome: Progressing   Problem: Elimination: Goal: Will not experience complications related to bowel motility Outcome: Progressing Goal: Will not experience complications related to urinary retention Outcome: Progressing   Problem: Pain Managment: Goal: General experience of comfort will improve Outcome: Progressing   Problem: Safety: Goal: Ability to remain free from injury will improve Outcome: Progressing   Problem: Skin Integrity: Goal: Risk for impaired skin integrity will decrease Outcome: Progressing   Problem: Education: Goal: Verbalization of understanding the information provided (i.e., activity precautions, restrictions, etc) will improve Outcome: Progressing Goal: Individualized Educational Video(s) Outcome: Progressing   Problem: Activity: Goal: Ability to ambulate and perform ADLs will improve Outcome: Progressing   Problem: Clinical Measurements: Goal:  Postoperative complications will be avoided or minimized Outcome: Progressing   Problem: Self-Concept: Goal: Ability to maintain and perform role responsibilities to the fullest extent possible will improve Outcome: Progressing   Problem: Pain Management: Goal: Pain level will decrease Outcome: Progressing   

## 2019-11-12 NOTE — Evaluation (Signed)
Physical Therapy Evaluation Patient Details Name: Teresa Holmes MRN: [id number] DOB: Aug 18, 1950 Today's Date: 11/12/2019   History of Present Illness  Sofhia Neu is a 69 y/o female who was admitted for mechanical fall which resulted in subcapital fracture of L femoral neck. Pt underwent L posterior hemiarthroplasty on 10/10. PMH includes CVA w/ L resuidual hemiplegia, COPD, CAD s/p MIs, paroxysmal A-fib on Coumadin, hypothyroidism, aneurism, anxiety, arthritis, cancer, CHF, and HLD.  Clinical Impression  Pt lying in bed with L hip in external rotation and absorbant pads soiled/soaked in urine. Pt reported needing to have a BM and requesting to be placed on bedpan however was able to wait until after physical assessment before being assisted to [facility]. Pt reports increased fear of using BSC and fear of falling stating that is show she fell which led to this admission. Pt with active RUE and RLE movement with generalized weakness noted in both. Pt unable to reposition self in bed secondary to residual hemiplegia.  Pt reports PLOF as ambulating household distances with use of quad cane and transferring mod I with use of quad cane and occasionally a lift chair in the home. At this time, pt requires max+2 A for supine <> sit and heavy max for sit <> stand and stand pivot transfer. Unsafe to attempt ambulation at this time as pt unable to remain upright without max-total A. Once in standing, pt unable to stand upright remaining flexed at head and trunk. Pt with poor sitting balance with noted L lean with back supported and required up to mod A for unsupported back. Pt with residual hemiplegia with flaccid LUE and 1/5 strength knee extension in LLE with no other active movements noted. Pt with poor tolerance to PROM to LLE (within movement precautions) due to L hip pain. Pt currently with deficits in pain, strength, functional mobility, sitting and standing balance, and functional activity tolerance. Pt limited  at this time secondary to fear, L sided hemiplegia, and pain. Pt unable to tolerate sitting up in recliner chair and requested return to bed. Pt would benefit from skilled PT during acute stay to address aforementioned deficits and STR at discharge to optimize return to PLOF, maximize functional mobility, safety, independence, and decrease fall risk. Attempted to set bed alarm however bed alarm function so sensitive, it continued to go off without movement so bed alarm disabled. Bed lowered to lowest setting with call bell and phone placed on R side within reach.     Follow Up Recommendations SNF    Equipment Recommendations  None recommended by PT (pt has equipment)    Recommendations for Other Services       Precautions / Restrictions Precautions Precautions: Fall;Posterior Hip Precaution Booklet Issued: No Restrictions Weight Bearing Restrictions: Yes LLE Weight Bearing: Weight bearing as tolerated      Mobility  Bed Mobility Overal bed mobility: Needs Assistance Bed Mobility: Rolling Rolling: Max assist   Supine to sit: Max assist;+2 for physical assistance;HOB elevated Sit to supine: Max assist;+2 for physical assistance   General bed mobility comments: MAX A for L rolling, anticipate +2 for R rolling. Unable to achieve sitting w/o +2  Transfers Overall transfer level: Needs assistance Equipment used: None Transfers: Stand Pivot Transfers;Sit to/from Stand Sit to Stand: Max assist;From elevated surface Stand pivot transfers: Max assist;From elevated surface       General transfer comment: +2  Ambulation/Gait             General Gait Details: not safe to  attempt  Stairs            Wheelchair Mobility    Modified Rankin (Stroke Patients Only)       Balance Overall balance assessment: Needs assistance Sitting-balance support: Single extremity supported;Feet supported Sitting balance-Leahy Scale: Poor Sitting balance - Comments: poor sitting  balance despite bilateral feet resting on floor and RUE assist on bedrail requiring SBA-mod A for safety Postural control: Left lateral lean Standing balance support: Single extremity supported Standing balance-Leahy Scale: Zero Standing balance comment: pt unable to remain upright without heavy max A for balance/support                             Pertinent Vitals/Pain Pain Assessment: Faces Pain Score: 8  Faces Pain Scale: Hurts even more Pain Location: L hip and back Pain Descriptors / Indicators: Aching;Discomfort;Grimacing;Sore Pain Intervention(s): Limited activity within patient's tolerance;Repositioned    Home Living Family/patient expects to be discharged to:: Private residence Living Arrangements: Alone Available Help at Discharge: Personal care attendant;Available PRN/intermittently Type of Home: House Home Access: Ramped entrance     Home Layout: Two level;Able to live on main level with bedroom/bathroom Home Equipment: Kasandra Knudsen - quad;Cane - single point;Bedside commode;Shower seat;Wheelchair - Education officer, community - power;Other (comment) (lift chair) Additional Comments: Pt reports having a personal care attendant who comes 7 days/week. Unclear on how many hours care attendant stays as pt initially agreed to 8 hrs/day then said that sometimes she stays overnight.    Prior Function Level of Independence: Independent with assistive device(s)         Comments: Pt reports she was ambulatory with quad cane and able to transfer with use of quad cane. Pt also reports using lift chair for transfers from time to time; pt denies fall history in previous 6 months aside from admitting fall     Hand Dominance   Dominant Hand: Right    Extremity/Trunk Assessment   Upper Extremity Assessment Upper Extremity Assessment: RUE deficits/detail;LUE deficits/detail RUE Deficits / Details: 4/5 grossly. AROM WFL LUE Deficits / Details: LUE flaccid from previous CVA; noted to  be bruised and swollen LUE Sensation: decreased light touch;decreased proprioception LUE Coordination: decreased fine motor;decreased gross motor    Lower Extremity Assessment Lower Extremity Assessment: Defer to PT evaluation LLE Deficits / Details: LLE with 1/5 knee extension and no other active movements noted LLE Sensation: decreased light touch;decreased proprioception LLE Coordination: decreased fine motor;decreased gross motor       Communication   Communication: No difficulties  Cognition Arousal/Alertness: Awake/alert Behavior During Therapy: WFL for tasks assessed/performed;Anxious Overall Cognitive Status: Within Functional Limits for tasks assessed                                 General Comments: Pt A & O x 4.      General Comments General comments (skin integrity, edema, etc.): bruising t/o LUE    Exercises Other Exercises Other Exercises: Pt educated re: OT role, DME recs, d/c recs, falls prevention, importance of mobility for functional strengthening, posterior hip pcns Other Exercises: LBD, self-drinking, rolling   Assessment/Plan    PT Assessment Patient needs continued PT services  PT Problem List Decreased strength;Decreased range of motion;Decreased activity tolerance;Decreased balance;Decreased mobility;Decreased coordination;Decreased knowledge of precautions;Impaired sensation;Impaired tone;Pain       PT Treatment Interventions DME instruction;Gait training;Functional mobility training;Therapeutic activities;Therapeutic exercise;Balance training;Neuromuscular re-education;Patient/family education;Wheelchair mobility training  PT Goals (Current goals can be found in the Care Plan section)  Acute Rehab PT Goals Patient Stated Goal: to go home PT Goal Formulation: With patient Time For Goal Achievement: 11/26/19 Potential to Achieve Goals: Fair Additional Goals Additional Goal #1: Pt will perform sit <> stand/stand pivot transfer  with no more than mod A and LRAD from bed/chair level surfaces. (to maximize functional mobility)    Frequency BID   Barriers to discharge        Co-evaluation               AM-PAC PT "6 Clicks" Mobility  Outcome Measure Help needed turning from your back to your side while in a flat bed without using bedrails?: A Lot Help needed moving from lying on your back to sitting on the side of a flat bed without using bedrails?: A Lot Help needed moving to and from a bed to a chair (including a wheelchair)?: Total Help needed standing up from a chair using your arms (e.g., wheelchair or bedside chair)?: Total Help needed to walk in hospital room?: Total Help needed climbing 3-5 steps with a railing? : Total 6 Click Score: 8    End of Session Equipment Utilized During Treatment: Gait belt Activity Tolerance: Patient limited by pain Patient left: in bed;with call bell/phone within reach Nurse Communication: Mobility status PT Visit Diagnosis: Unsteadiness on feet (R26.81);Other abnormalities of gait and mobility (R26.89);Muscle weakness (generalized) (M62.81);History of falling (Z91.81);Hemiplegia and hemiparesis;Pain Hemiplegia - Right/Left: Left Hemiplegia - dominant/non-dominant: Non-dominant Hemiplegia - caused by: Cerebral infarction Pain - Right/Left: Left Pain - part of body: Hip (back)    Time: 0912-1001 PT Time Calculation (min) (ACUTE ONLY): 49 min   Charges:              Vale Haven, SPT  Vale Haven 11/12/2019, 3:18 PM

## 2019-11-12 NOTE — Progress Notes (Signed)
PROGRESS NOTE    Teresa Holmes  YCX:[id number] DOB: 1950-02-08 DOA: 11/10/2019 PCP: Ronnell Freshwater, NP   Chief Complain: Fall  Brief Narrative: Patient is 69 year old female with history of nonhemorrhagic CVA with residual left hemiplegia, COPD, coronary artery disease, paroxysmal A. fib on Coumadin, hypothyroidism who presents from home with complaints of fall several days ago and decided to present to the emergency department after persistent left hip pain.  She normally ambulates with a cane, lives alone and has a daytime caretaker.  Patient reported significant swelling of the left arm since the fall.  On presentation she was hypotensive, anemic with hemoglobin of 5.8, baseline of 7.  Chest x-ray showed several left rib fractures mainly in the fourth and sixth rib.  Left hip x-ray showed superiorly displaced subcapital  fracture of the left femoral neck.  Underwent Left hip unipolar hemiarthroplasty  on 11/11/2019.  PT/OT consulted with plan to discharge to skilled nursing facility.  She is medically stable for discharge to skilled nursing facility as soon as bed is available.  Assessment & Plan:   Principal Problem:   Hip fracture ([place]) Active Problems:   Hypothyroidism   Closed rib fracture   Acute blood loss anemia   AF (paroxysmal atrial fibrillation) ([facility])   CVA, old, hemiparesis ([place])   Wound of skin   Fall/left femur fracture: Left hip x-ray showed superiorly displaced subcapital  fracture of the left femoral neck.  Orthopedics consulted and she underwent left hip unipolar hemiarthroplasty with repair of left greater trochanter fracture. PT/OTordered.  On coumadin for Dvt ppx Most likely she will need a skilled nursing facility on discharge.  She states she does not want to go to rehab and wants to go home.  I have discussed with power of attorney and she is in agreement with going to the rehab.  Left-sided rib fractures: Continue pain management, supportive care.   Incentive spirometry.  Left upper extremity skin tear/swelling: Ultrasound showed hematoma, no fractures or DVT.  Continue supportive care.  Continue wound care.  Acute blood loss normocytic anemia:   Presented with hemoglobin of 5.8, most likely associated with blood loss from hip fracture.  She was given a unit of PRBC .  Hemoglobin in the range of 9 today.  Paroxysmal A. fib: On Coumadin for anticoagulation.  Coumadin resumed, will monitor INR.   On metoprolol for rate control.  History of nonhemorrhagic CVA: Has left-sided residual hemiplegia.  Ambulates with a cane.  PT/OT consulted .  Most likely she will need a skilled nursing facility on discharge.  Hypothyroidism: Continue Synthyroid           DVT prophylaxis: SCD Code Status: Full Family Communication: Discussed with power of attorney on 11/11/19 on phone Status is: Inpatient  Remains inpatient appropriate because:Inpatient level of care appropriate due to severity of illness   Dispo: The patient is from: Home              Anticipated d/c is to: SNF              Anticipated d/c date is: 1 day              Patient currently is not medically stable to d/c.     Consultants: Ortho  Procedures:None  Antimicrobials:  Anti-infectives (From admission, onward)   Start     Dose/Rate Route Frequency Ordered Stop   11/11/19 1800  ceFAZolin (ANCEF) IVPB 1 g/50 mL premix  1 g 100 mL/hr over 30 Minutes Intravenous Every 6 hours 11/11/19 1459 11/12/19 0741   11/11/19 0600  ceFAZolin (ANCEF) IVPB 2g/100 mL premix       Note to Pharmacy: In OR prior to surgery   2 g 200 mL/hr over 30 Minutes Intravenous 30 min pre-op 11/10/19 1811 11/11/19 1200      Subjective:  Patient seen and examined the bedside this morning.  Hemodynamically stable during my evaluation.  She was being worked up with physical therapy and Occupational Therapy.  Appears comfortable.  She needed total assistance while getting out of the bed.  She  was reluctant on idea about going to the rehab.  Objective: Vitals:   11/11/19 1934 11/12/19 0025 11/12/19 0341 11/12/19 0738  BP: (!) 140/95 (!) 149/122 136/62 (!) 147/63  Pulse: 84 63 62 64  Resp: 17 18 17 17   Temp: 97.9 F (36.6 C) 98.3 F (36.8 C) 98.6 F (37 C) 99 F (37.2 C)  TempSrc: Oral Oral Oral Oral  SpO2: 99% 98% 96% 97%  Weight:      Height:        Intake/Output Summary (Last 24 hours) at 11/12/2019 0749 Last data filed at 11/12/2019 0700 Gross per 24 hour  Intake 1724.01 ml  Output 365 ml  Net 1359.01 ml   Filed Weights   11/10/19 1601  Weight: 37 kg    Examination:  General exam:Not in distress, very deconditioned  HEENT:PERRL,Oral mucosa moist, Ear/Nose normal on gross exam Respiratory system: Bilateral equal air entry, normal vesicular breath sounds, no wheezes or crackles  Cardiovascular system: S1 & S2 heard, RRR. No JVD, murmurs, rubs, gallops or clicks. Gastrointestinal system: Abdomen is nondistended, soft and nontender. No organomegaly or masses felt. Normal bowel sounds heard. Central nervous system: Alert and oriented. No focal neurological deficits. Extremities: no clubbing ,no cyanosis, clean surgical wound on the left hip, left upper extremity swelling and ecchymosis have improved Skin: No rashes, lesions or ulcers,no icterus ,no pallor    Data Reviewed: I have personally reviewed following labs and imaging studies  CBC: Recent Labs  Lab 11/10/19 1603 11/10/19 2245 11/11/19 0500 11/12/19 0331  WBC 8.4  --  7.5 10.4  NEUTROABS  --   --   --  8.5*  HGB 5.8* 7.5* 7.2* 9.1*  HCT 19.8* 23.7* 22.9* 26.9*  MCV 102.1*  --  97.0 92.4  PLT 544*  --  415* 725   Basic Metabolic Panel: Recent Labs  Lab 11/10/19 1603 11/11/19 0500 11/12/19 0331  NA 133* 132* 132*  K 3.3* 3.5 3.8  CL 98 98 102  CO2 25 26 22   GLUCOSE 115* 94 88  BUN 24* 19 18  CREATININE 0.64 0.47 0.69  CALCIUM 7.6* 7.4* 7.7*   GFR: Estimated Creatinine  Clearance: 38.2 mL/min (by C-G formula based on SCr of 0.69 mg/dL). Liver Function Tests: Recent Labs  Lab 11/10/19 1603  AST 40  ALT 19  ALKPHOS 160*  BILITOT 1.4*  PROT 5.4*  ALBUMIN 2.1*   No results for input(s): LIPASE, AMYLASE in the last 168 hours. No results for input(s): AMMONIA in the last 168 hours. Coagulation Profile: Recent Labs  Lab 11/10/19 1603 11/10/19 2245 11/11/19 0500 11/11/19 1050 11/12/19 0331  INR 2.2* 1.7* 1.3* 1.2 1.2   Cardiac Enzymes: No results for input(s): CKTOTAL, CKMB, CKMBINDEX, TROPONINI in the last 168 hours. BNP (last 3 results) No results for input(s): PROBNP in the last 8760 hours. HbA1C: No results for input(s):  HGBA1C in the last 72 hours. CBG: No results for input(s): GLUCAP in the last 168 hours. Lipid Profile: No results for input(s): CHOL, HDL, LDLCALC, TRIG, CHOLHDL, LDLDIRECT in the last 72 hours. Thyroid Function Tests: No results for input(s): TSH, T4TOTAL, FREET4, T3FREE, THYROIDAB in the last 72 hours. Anemia Panel: No results for input(s): VITAMINB12, FOLATE, FERRITIN, TIBC, IRON, RETICCTPCT in the last 72 hours. Sepsis Labs: No results for input(s): PROCALCITON, LATICACIDVEN in the last 168 hours.  Recent Results (from the past 240 hour(s))  Respiratory Panel by RT PCR (Flu A&B, Covid) - Nasopharyngeal Swab     Status: None   Collection Time: 11/10/19  4:44 PM   Specimen: Nasopharyngeal Swab  Result Value Ref Range Status   SARS Coronavirus 2 by RT PCR NEGATIVE NEGATIVE Final    Comment: (NOTE) SARS-CoV-2 target nucleic acids are NOT DETECTED.  The SARS-CoV-2 RNA is generally detectable in upper respiratoy specimens during the acute phase of infection. The lowest concentration of SARS-CoV-2 viral copies this assay can detect is 131 copies/mL. A negative result does not preclude SARS-Cov-2 infection and should not be used as the sole basis for treatment or other patient management decisions. A negative result  may occur with  improper specimen collection/handling, submission of specimen other than nasopharyngeal swab, presence of viral mutation(s) within the areas targeted by this assay, and inadequate number of viral copies (<131 copies/mL). A negative result must be combined with clinical observations, patient history, and epidemiological information. The expected result is Negative.  Fact Sheet for Patients:  PinkCheek.be  Fact Sheet for Healthcare Providers:  GravelBags.it  This test is no t yet approved or cleared by the [place] FDA and  has been authorized for detection and/or diagnosis of SARS-CoV-2 by FDA under an Emergency Use Authorization (EUA). This EUA will remain  in effect (meaning this test can be used) for the duration of the COVID-19 declaration under Section 564(b)(1) of the Act, 21 U.S.C. section 360bbb-3(b)(1), unless the authorization is terminated or revoked sooner.     Influenza A by PCR NEGATIVE NEGATIVE Final   Influenza B by PCR NEGATIVE NEGATIVE Final    Comment: (NOTE) The Xpert Xpress SARS-CoV-2/FLU/RSV assay is intended as an aid in  the diagnosis of influenza from Nasopharyngeal swab specimens and  should not be used as a sole basis for treatment. Nasal washings and  aspirates are unacceptable for Xpert Xpress SARS-CoV-2/FLU/RSV  testing.  Fact Sheet for Patients: PinkCheek.be  Fact Sheet for Healthcare Providers: GravelBags.it  This test is not yet approved or cleared by the [place] FDA and  has been authorized for detection and/or diagnosis of SARS-CoV-2 by  FDA under an Emergency Use Authorization (EUA). This EUA will remain  in effect (meaning this test can be used) for the duration of the  Covid-19 declaration under Section 564(b)(1) of the Act, 21  U.S.C. section 360bbb-3(b)(1), unless the authorization is  terminated  or revoked. Performed at [facility], [address] [place], [place] [address]          Radiology Studies: DG Chest 1 View  Result Date: 11/10/2019 CLINICAL DATA:  Fall 2 days ago.  Left arm wound. EXAM: CHEST  1 VIEW COMPARISON:  Chest radiographs 09/28/2018 and 01/18/2006. Abdominal radiographs 09/30/2018 FINDINGS: 1640 hours. Mild patient rotation to the left. The heart size and mediastinal contours are stable status post median sternotomy. The lungs appear clear. There is no pleural effusion or pneumothorax. There are several left rib fractures  laterally which may be subacute, notably in the 4th and 6th ribs. Fractures of the 7th and 8th ribs may be acute. IMPRESSION: Several left-sided rib fractures, at least some likely subacute. No pleural effusion or pneumothorax. Electronically Signed   By: Richardean Sale M.D.   On: 11/10/2019 17:40   DG Pelvis 1-2 Views  Result Date: 11/10/2019 CLINICAL DATA:  69 year old female with fall. EXAM: PELVIS - 1-2 VIEW COMPARISON:  Pelvic radiograph dated 09/28/2018. FINDINGS: There is a displaced fracture of the left femoral neck with proximal migration of the femoral shaft. No dislocation. There is advanced osteopenia. Moderate stool noted in the rectal vault. The soft tissues are unremarkable. IMPRESSION: Displaced fracture of the left femoral neck. Electronically Signed   By: Anner Crete M.D.   On: 11/10/2019 17:32   DG Forearm Left  Result Date: 11/10/2019 CLINICAL DATA:  69 year old female with fall and trauma to the left upper extremity. EXAM: LEFT FOREARM - 2 VIEW COMPARISON:  None. FINDINGS: Evaluation is very limited due to advanced osteopenia. No definite acute fracture or dislocation. Old appearing fracture deformity of the distal radius. Clinical correlation is recommended. The bones are osteopenic. There is diffuse subcutaneous soft tissue edema. No radiopaque foreign object or soft tissue gas. IMPRESSION: 1. No definite  acute fracture or dislocation. Old appearing fracture of the distal radius. 2. Diffuse subcutaneous soft tissue edema. Electronically Signed   By: Anner Crete M.D.   On: 11/10/2019 17:30   CT Head Wo Contrast  Result Date: 11/10/2019 CLINICAL DATA:  Fall 2 days ago EXAM: CT HEAD WITHOUT CONTRAST TECHNIQUE: Contiguous axial images were obtained from the base of the skull through the vertex without intravenous contrast. COMPARISON:  August 14, 2019 FINDINGS: Brain: No evidence of acute infarction, hemorrhage, hydrocephalus, extra-axial collection or mass lesion/mass effect. Sequela of large RIGHT MCA infarction with encephalomalacia and cortical gliosis involving the RIGHT temporal and frontoparietal lobes. Periventricular white matter hypodensities consistent with sequela of chronic microvascular ischemic disease. Remote RIGHT cerebellar infarction. Vascular: No hyperdense vessel or unexpected calcification. Skull: Normal. Negative for fracture or focal lesion. Sinuses/Orbits: No acute finding. Other: None. IMPRESSION: 1.  No acute intracranial abnormality. Electronically Signed   By: Valentino Saxon MD   On: 11/10/2019 17:24   US Venous Img Upper Uni Left  Result Date: 11/10/2019 CLINICAL DATA:  Fall, redness and swelling of the left upper extremity EXAM: LEFT UPPER EXTREMITY VENOUS DOPPLER ULTRASOUND TECHNIQUE: Gray-scale sonography with graded compression, as well as color Doppler and duplex ultrasound were performed to evaluate the upper extremity deep venous system from the level of the subclavian vein and including the jugular, axillary, basilic, radial, ulnar and upper cephalic vein. Spectral Doppler was utilized to evaluate flow at rest and with distal augmentation maneuvers. COMPARISON:  None. FINDINGS: Contralateral Subclavian Vein: Respiratory phasicity is normal and symmetric with the symptomatic side. No evidence of thrombus. Normal compressibility. Internal Jugular Vein: No evidence of  thrombus. Normal compressibility, respiratory phasicity and response to augmentation. Subclavian Vein: No evidence of thrombus. Normal compressibility, respiratory phasicity and response to augmentation. Axillary Vein: No evidence of thrombus. Normal compressibility, respiratory phasicity and response to augmentation. Cephalic Vein: No evidence of thrombus. Normal compressibility, respiratory phasicity and response to augmentation. Basilic Vein: No evidence of thrombus. Normal compressibility, respiratory phasicity and response to augmentation. Brachial Veins: No evidence of thrombus. Normal compressibility, respiratory phasicity and response to augmentation. Radial Veins: No evidence of thrombus. Normal compressibility, respiratory phasicity and response to augmentation. Ulnar Veins:  No evidence of thrombus. Normal compressibility, respiratory phasicity and response to augmentation. Venous Reflux:  None visualized. Other Findings: There is a hypoechoic collection seen in the region of the left axilla at the level of though separate from the axillary vein which could reflect a hematoma or seroma though is overall indeterminate on these ultrasound images. Additionally, there is diffuse soft tissue edema of the left upper extremity most pronounced at the level of the upper arm. IMPRESSION: 1. No evidence of DVT within the left upper extremity. 2. Hypoechoic collection in the region of the left axilla measuring 2.9 by 1.0 cm in transverse dimensions (image 12/37) could reflect a hematoma in the setting of trauma though is indeterminate on these ultrasound images. Could consider further evaluation with cross-sectional imaging. 3. Diffuse soft tissue edema of the left upper extremity, most pronounced at the level of the upper arm. Electronically Signed   By: Lovena Le M.D.   On: 11/10/2019 17:55   DG HIP UNILAT W OR W/O PELVIS 2-3 VIEWS LEFT  Result Date: 11/11/2019 CLINICAL DATA:  Postop, left hip arthroplasty  EXAM: DG HIP (WITH OR WITHOUT PELVIS) 2-3V LEFT COMPARISON:  Radiograph 11/10/2019 FINDINGS: Patient appears to be post left hip hemiarthroplasty for the repair of the previously seen displaced femoral neck fracture. Articulating femoral component appears normally seated within the acetabulum. Few adjacent postsurgical ossifications are present. Associated postsurgical soft tissue changes including soft tissue gas, intra-articular gas and overlying skin staples are present. Additional surgical clips noted in the medial thighs, likely related to prior vascular surgeries. Remaining bones of the pelvis are intact and congruent. The osseous structures appear diffusely demineralized which may limit detection of small or nondisplaced fractures. IMPRESSION: Status post left hip hemiarthroplasty without evidence of acute hardware complication. Electronically Signed   By: Lovena Le M.D.   On: 11/11/2019 19:35   DG Femur Min 2 Views Left  Result Date: 11/10/2019 CLINICAL DATA:  Fall 2 days ago.  Hip deformity. EXAM: LEFT FEMUR 2 VIEWS COMPARISON:  Pelvic radiographs same date. One view abdomen 07/15/2019. FINDINGS: The bones are diffusely demineralized. There is a superiorly displaced subcapital fracture of the left femoral neck. The fracture margins are somewhat ill-defined, and this could be subacute in age. The distal femur is intact. The visualized bony pelvis is intact. Mild degenerative changes are present at the left hip and knee. There are vascular clips in the left groin and medial thigh. IMPRESSION: Superiorly displaced subcapital fracture of the left femoral neck, possibly subacute in age. Electronically Signed   By: Richardean Sale M.D.   On: 11/10/2019 17:36        Scheduled Meds: . acetaminophen  500 mg Oral Q6H  . atorvastatin  40 mg Oral Daily  . docusate sodium  100 mg Oral BID  . enoxaparin (LOVENOX) injection  30 mg Subcutaneous Q24H  . fenofibrate  160 mg Oral Daily  . levothyroxine  50  mcg Oral QAC breakfast  . metoprolol tartrate  12.5 mg Oral BID  . warfarin  2 mg Oral ONCE-1600  . Warfarin - Pharmacist Dosing Inpatient   Does not apply q1600   Continuous Infusions: . sodium chloride 75 mL/hr at 11/11/19 1512     LOS: 2 days    Time spent: 25 mins.More than 50% of that time was spent in counseling and/or coordination of care.      Shelly Coss, MD [facility] P10/12/2019, 7:49 AM

## 2019-11-12 NOTE — Consult Note (Addendum)
[place] for Warfarin Indication: CVA  Allergies  Allergen Reactions  . Aspirin Other (See Comments)    ulcer  . Sulfa Antibiotics Other (See Comments)    unknown    Patient Measurements: Height: 4\' 9"  (144.8 cm) Weight: 37 kg (81 lb 9.1 oz) IBW/kg (Calculated) : 38.6  Vital Signs: Temp: 98.6 F (37 C) (10/11 0341) Temp Source: Oral (10/11 0341) BP: 136/62 (10/11 0341) Pulse Rate: 62 (10/11 0341)  Labs: Recent Labs    11/10/19 1603 11/10/19 1603 11/10/19 2245 11/10/19 2245 11/11/19 0500 11/11/19 1050 11/12/19 0331  HGB 5.8*   < > 7.5*   < > 7.2*  --  9.1*  HCT 19.8*   < > 23.7*  --  22.9*  --  26.9*  PLT 544*  --   --   --  415*  --  298  LABPROT 23.7*   < > 19.4*   < > 16.0* 14.9 14.7  INR 2.2*   < > 1.7*   < > 1.3* 1.2 1.2  CREATININE 0.64  --   --   --  0.47  --  0.69   < > = values in this interval not displayed.    Estimated Creatinine Clearance: 38.2 mL/min (by C-G formula based on SCr of 0.69 mg/dL).   Medical History: Past Medical History:  Diagnosis Date  . Aneurysm ([place])   . Anxiety   . Arthritis   . Cancer ([place])   . CHF (congestive heart failure) ([place])   . COPD (chronic obstructive pulmonary disease) ([place])   . Hyperlipidemia   . Hypertension   . Stroke ([place])   . Thyroid disease     Medications:  PTA warfarin 1mg  daily  Assessment: 69yo female presented with L hip pain. PMH COPD, anemia, CHF, HLD, HTN, hypothyroidism, and left hemiparesis following a stroke (on warfarin). Patient's last dose of warfarin was her home dose of 1mg  on 10/8. Patient received IV vitamin K 1mg  x2 doses in anticipation for procedure (arthroplasty bipolar hip) and warfarin was held.   Pharmacy has been consulted for warfarin dosing and monitoring.   Hgb 9.1 s/p 2units pRBCs (based on previous labs, baseline Hgb seems to be in 7's) Plt 544>415  Date INR  Dose 10/9 1.7>1.3 Held 10/10 1.2  2mg   (Patient procedure  concluded ~1400 10/10; typically wait 12-24 hours to restart warfarin after procedure. D/w ortho, okay to restart warfarin evening 10/10. Will give warfarin 2mg )  10/11  1.2  Goal of Therapy:  INR 2-3 Monitor platelets by anticoagulation protocol: Yes   Plan:  INR subtherapeutic. Will give 2mg  dose again tonight.    On LMWH DVT prophylaxis while subtherapeutic.  Monitor daily INR and [place], PharmD, BCPS Clinical Pharmacist 11/12/2019 7:10 AM

## 2019-11-12 NOTE — Evaluation (Signed)
Occupational Therapy Evaluation Patient Details Name: Teresa Holmes MRN: [id number] DOB: 1950-06-17 Today's Date: 11/12/2019    History of Present Illness Teresa Holmes is a 69 y/o female who was admitted for mechanical fall which resulted in subcapital fracture of L femoral neck. Pt underwent L posterior hemiarthroplasty on 10/10. PMH includes CVA w/ L resuidual hemiplegia, COPD, CAD s/p MIs, paroxysmal A-fib on Coumadin, hypothyroidism, aneurism, anxiety, arthritis, cancer, CHF, and HLD.   Clinical Impression   [name] was seen for OT evaluation this date. Prior to hospital admission, pt was MOD I for mobility using QC, reports requiring assist for bathing/dressing 2/2 baseline L hemiparesis. Pt lives alone c PRN caregiver assistance - unclear how frequently. Pt presents to acute OT demonstrating impaired ADL performance and functional mobility 2/2 decreased activity tolerance, pain, functional strength/balance deficits, and decreased LB access. Pt currently requires SETUP self-drinking at bed level. TOTAL A for LBD at bed level. MAX A L rolling - anticipates +2 for R rolling/ toileting at bed level. Pt would benefit from skilled OT to address noted impairments and functional limitations (see below for any additional details) in order to maximize safety and independence while minimizing falls risk and caregiver burden. Upon hospital discharge, recommend STR to maximize pt safety and return to PLOF.     Follow Up Recommendations  SNF    Equipment Recommendations  None recommended by OT    Recommendations for Other Services       Precautions / Restrictions Precautions Precautions: Fall;Posterior Hip Precaution Booklet Issued: No Restrictions Weight Bearing Restrictions: Yes LLE Weight Bearing: Weight bearing as tolerated      Mobility Bed Mobility Overal bed mobility: Needs Assistance Bed Mobility: Rolling Rolling: Max assist     General bed mobility comments: MAX A for L  rolling, anticipate +2 for R rolling. Unable to achieve sitting w/o +2  Transfers      General transfer comment: +2        ADL either performed or assessed with clinical judgement   ADL Overall ADL's : Needs assistance/impaired        General ADL Comments: SETUP self-drinking at bed level. TOTAL A for LBD at bed level. +2 required for ADL t/f.                   Pertinent Vitals/Pain Pain Assessment: Faces Faces Pain Scale: Hurts even more Pain Location: L hip and back Pain Descriptors / Indicators: Aching;Discomfort;Grimacing;Sore Pain Intervention(s): Limited activity within patient's tolerance;Repositioned     Hand Dominance Right   Extremity/Trunk Assessment Upper Extremity Assessment Upper Extremity Assessment: RUE deficits/detail;LUE deficits/detail RUE Deficits / Details: 4/5 grossly. AROM WFL LUE Deficits / Details: LUE flaccid from previous CVA; noted to be bruised and swollen LUE Sensation: decreased light touch;decreased proprioception LUE Coordination: decreased fine motor;decreased gross motor   Lower Extremity Assessment Lower Extremity Assessment: Defer to PT evaluation LLE Deficits / Details: LLE with 1/5 knee extension and no other active movements noted LLE Sensation: decreased light touch;decreased proprioception LLE Coordination: decreased fine motor;decreased gross motor       Communication Communication Communication: No difficulties   Cognition Arousal/Alertness: Awake/alert Behavior During Therapy: WFL for tasks assessed/performed;Anxious Overall Cognitive Status: Within Functional Limits for tasks assessed            General Comments: Pt A & O x 4.   General Comments  bruising t/o LUE    Exercises Exercises: Other exercises Other Exercises Other Exercises: Pt educated re: OT role, DME  recs, d/c recs, falls prevention, importance of mobility for functional strengthening, posterior hip pcns Other Exercises: LBD, self-drinking,  rolling   Shoulder Instructions      Home Living Family/patient expects to be discharged to:: Private residence Living Arrangements: Alone Available Help at Discharge: Personal care attendant;Available PRN/intermittently Type of Home: House Home Access: Ramped entrance     Home Layout: Two level;Able to live on main level with bedroom/bathroom     Bathroom Shower/Tub: Teacher, early years/pre: Standard     Home Equipment: Cane - quad;Cane - single point;Bedside commode;Shower seat;Wheelchair - Education officer, community - power;Other (comment) (lift chair)   Additional Comments: Pt reports having a personal care attendant who comes 7 days/week. Unclear on how many hours care attendant stays as pt initially agreed to 8 hrs/day then said that sometimes she stays overnight.      Prior Functioning/Environment Level of Independence: Needs Assistance        Comments: Pt reports she was ambulatory with quad cane and able to transfer with use of quad cane. Pt also reports using lift chair for transfers from time to time; pt denies fall history in previous 6 months aside from admitting fall. Reports assistance for bathing/ dressing from care attendant          OT Problem List: Decreased strength;Decreased range of motion;Decreased activity tolerance;Impaired balance (sitting and/or standing);Decreased safety awareness;Decreased knowledge of use of DME or AE      OT Treatment/Interventions: Self-care/ADL training;Therapeutic exercise;Energy conservation;DME and/or AE instruction;Therapeutic activities;Balance training;Patient/family education    OT Goals(Current goals can be found in the care plan section) Acute Rehab OT Goals Patient Stated Goal: to go home OT Goal Formulation: With patient Time For Goal Achievement: 11/26/19 Potential to Achieve Goals: Fair ADL Goals Pt Will Perform Eating: with modified independence;bed level Pt Will Perform Upper Body Bathing: with min  assist;sitting Pt Will Transfer to Toilet: with mod assist;with +2 assist;squat pivot transfer;bedside commode (c LRAD PRN)  OT Frequency: Min 2X/week   Barriers to D/C: Inaccessible home environment;Decreased caregiver support             AM-PAC OT "6 Clicks" Daily Activity     Outcome Measure Help from another person eating meals?: A Little Help from another person taking care of personal grooming?: A Little Help from another person toileting, which includes using toliet, bedpan, or urinal?: A Lot Help from another person bathing (including washing, rinsing, drying)?: A Lot Help from another person to put on and taking off regular upper body clothing?: A Lot Help from another person to put on and taking off regular lower body clothing?: A Lot 6 Click Score: 14   End of Session    Activity Tolerance: Patient limited by pain Patient left: in bed;with call bell/phone within reach (bed alarm no working, lowest position )  OT Visit Diagnosis: Unsteadiness on feet (R26.81);Other abnormalities of gait and mobility (R26.89);Muscle weakness (generalized) (M62.81)                Time: 1100-1107 OT Time Calculation (min): 7 min Charges:  OT General Charges $OT Visit: 1 Visit OT Evaluation $OT Eval Low Complexity: 1 Low  Dessie Coma, M.S. OTR/L  11/12/19, 1:15 PM  ascom [phone]

## 2019-11-12 NOTE — Progress Notes (Signed)
Physical Therapy Treatment Patient Details Name: Teresa Holmes MRN: [id number] DOB: Dec 19, 1950 Today's Date: 11/12/2019    History of Present Illness Teresa Holmes is a 69 y/o female who was admitted for mechanical fall which resulted in subcapital fracture of L femoral neck. Pt underwent L posterior hemiarthroplasty on 10/10. PMH includes CVA w/ L resuidual hemiplegia, COPD, CAD s/p MIs, paroxysmal A-fib on Coumadin, hypothyroidism, aneurism, anxiety, arthritis, cancer, CHF, and HLD.    PT Comments    Pt presents lying in bed with L hip in external rotation. She is educated on her posterior hip precautions and notes that she cannot move L LE. Pt is max+2 A for supine <> sit and requires maxA for balance once sitting EOB. She was screaming in lots of pain during all bed mobility. Transitioned her back to bed and performed LE exercises once supine. Bed lowered to lowest setting with call bell and phone placed on R side within reach. Updated frequency to QD as she is unable to tolerate BID session due to fatigue/pain. Pt will benefit from skilled PT services to address deficits in strength, balance, and decrease risk for future falls.    Follow Up Recommendations  SNF     Equipment Recommendations  None recommended by PT    Recommendations for Other Services       Precautions / Restrictions Precautions Precautions: Fall;Posterior Hip Precaution Booklet Issued: No Restrictions Weight Bearing Restrictions: Yes LLE Weight Bearing: Weight bearing as tolerated    Mobility  Bed Mobility Overal bed mobility: Needs Assistance Bed Mobility: Supine to Sit;Sit to Supine     Supine to sit: Max assist;+2 for physical assistance;HOB elevated Sit to supine: Max assist;+2 for physical assistance;HOB elevated   General bed mobility comments: Pt lying with LLE externally rotated and required max+2 A for truncal and BLE management to and over edge of bed and for return to supine position. Pt is  screaming in pain throughout bed mobility.  Transfers                 General transfer comment: Did not perform transfer due to patient's pain level  Ambulation/Gait             General Gait Details: not safe to attempt   Stairs             Wheelchair Mobility    Modified Rankin (Stroke Patients Only)       Balance Overall balance assessment: Needs assistance Sitting-balance support: Single extremity supported;Feet supported Sitting balance-Leahy Scale: Poor Sitting balance - Comments: poor sitting balance despite bilateral feet resting on floor and RUE assist on bedrail requiring MaxA for safety this afternoon due to increased pain       Standing balance comment: Did not perform due to safety                            Cognition Arousal/Alertness: Awake/alert Behavior During Therapy: WFL for tasks assessed/performed;Anxious Overall Cognitive Status: Within Functional Limits for tasks assessed                                        Exercises General Exercises - Lower Extremity Hip ABduction/ADduction: AROM;Right;10 reps;Supine Straight Leg Raises: AROM;10 reps;Right;Supine    General Comments General comments (skin integrity, edema, etc.): bruising t/o LUE      Pertinent Vitals/Pain Pain Assessment:  Faces Faces Pain Scale: Hurts whole lot Pain Location: L hip and back Pain Descriptors / Indicators: Aching;Discomfort;Grimacing;Sore Pain Intervention(s): Limited activity within patient's tolerance;Monitored during session    Home Living                      Prior Function            PT Goals (current goals can now be found in the care plan section) Acute Rehab PT Goals Patient Stated Goal: to go home PT Goal Formulation: With patient Time For Goal Achievement: 11/26/19 Potential to Achieve Goals: Fair Progress towards PT goals: Progressing toward goals    Frequency    7X/week      PT Plan  Frequency needs to be updated    Co-evaluation              AM-PAC PT "6 Clicks" Mobility   Outcome Measure  Help needed turning from your back to your side while in a flat bed without using bedrails?: A Lot Help needed moving from lying on your back to sitting on the side of a flat bed without using bedrails?: A Lot Help needed moving to and from a bed to a chair (including a wheelchair)?: Total Help needed standing up from a chair using your arms (e.g., wheelchair or bedside chair)?: Total Help needed to walk in hospital room?: Total Help needed climbing 3-5 steps with a railing? : Total 6 Click Score: 8    End of Session   Activity Tolerance: Patient limited by pain Patient left: in bed;with call bell/phone within reach Nurse Communication: Mobility status PT Visit Diagnosis: Unsteadiness on feet (R26.81);Other abnormalities of gait and mobility (R26.89);Muscle weakness (generalized) (M62.81);History of falling (Z91.81);Hemiplegia and hemiparesis;Pain Hemiplegia - Right/Left: Left Hemiplegia - dominant/non-dominant: Non-dominant Hemiplegia - caused by: Cerebral infarction Pain - Right/Left: Left Pain - part of body: Hip     Time: 1610-9604 PT Time Calculation (min) (ACUTE ONLY): 18 min  Charges:                         Noemi Chapel, SPT Bernita Raisin 11/12/2019, 5:06 PM

## 2019-11-13 LAB — CBC WITH DIFFERENTIAL/PLATELET
Abs Immature Granulocytes: 0.05 10*3/uL (ref 0.00–0.07)
Basophils Absolute: 0 10*3/uL (ref 0.0–0.1)
Basophils Relative: 0 %
Eosinophils Absolute: 0.1 10*3/uL (ref 0.0–0.5)
Eosinophils Relative: 1 %
HCT: 26.9 % — ABNORMAL LOW (ref 36.0–46.0)
Hemoglobin: 8.3 g/dL — ABNORMAL LOW (ref 12.0–15.0)
Immature Granulocytes: 1 %
Lymphocytes Relative: 14 %
Lymphs Abs: 1.3 10*3/uL (ref 0.7–4.0)
MCH: 30.1 pg (ref 26.0–34.0)
MCHC: 30.9 g/dL (ref 30.0–36.0)
MCV: 97.5 fL (ref 80.0–100.0)
Monocytes Absolute: 0.3 10*3/uL (ref 0.1–1.0)
Monocytes Relative: 4 %
Neutro Abs: 7.3 10*3/uL (ref 1.7–7.7)
Neutrophils Relative %: 80 %
Platelets: 282 10*3/uL (ref 150–400)
RBC: 2.76 MIL/uL — ABNORMAL LOW (ref 3.87–5.11)
RDW: 18.8 % — ABNORMAL HIGH (ref 11.5–15.5)
WBC: 9 10*3/uL (ref 4.0–10.5)
nRBC: 0 % (ref 0.0–0.2)

## 2019-11-13 LAB — PROTIME-INR
INR: 2.2 — ABNORMAL HIGH (ref 0.8–1.2)
Prothrombin Time: 23.4 seconds — ABNORMAL HIGH (ref 11.4–15.2)

## 2019-11-13 LAB — SURGICAL PATHOLOGY

## 2019-11-13 MED ORDER — WARFARIN SODIUM 1 MG PO TABS
1.0000 mg | ORAL_TABLET | Freq: Once | ORAL | Status: AC
  Administered 2019-11-13: 1 mg via ORAL
  Filled 2019-11-13: qty 1

## 2019-11-13 MED ORDER — HYDROCODONE-ACETAMINOPHEN 5-325 MG PO TABS: 1.0000 | ORAL_TABLET | Freq: Four times a day (QID) | ORAL | 0 refills | Status: DC | PRN

## 2019-11-13 MED ORDER — CALCIUM CARBONATE ANTACID 500 MG PO CHEW
1.0000 | CHEWABLE_TABLET | Freq: Three times a day (TID) | ORAL | Status: DC | PRN
  Administered 2019-11-13: 200 mg via ORAL
  Filled 2019-11-13: qty 1

## 2019-11-13 MED ORDER — POLYETHYLENE GLYCOL 3350 17 G PO PACK: 17.0000 g | PACK | Freq: Every day | ORAL | 1 refills | Status: AC

## 2019-11-13 MED ORDER — POLYETHYLENE GLYCOL 3350 17 G PO PACK
17.0000 g | PACK | Freq: Every day | ORAL | Status: DC
  Administered 2019-11-13 – 2019-11-14 (×2): 17 g via ORAL
  Filled 2019-11-13 (×3): qty 1

## 2019-11-13 MED ORDER — MELATONIN 5 MG PO TABS
5.0000 mg | ORAL_TABLET | Freq: Every day | ORAL | Status: DC
  Administered 2019-11-13 – 2019-11-14 (×2): 5 mg via ORAL
  Filled 2019-11-13 (×2): qty 1

## 2019-11-13 NOTE — Care Management Important Message (Signed)
Important Message  Patient Details  Name: Teresa Holmes MRN: [id number] Date of Birth: 16-Jun-1950   Medicare Important Message Given:  Yes     Dannette Barbara 11/13/2019, 10:43 AM

## 2019-11-13 NOTE — TOC Progression Note (Signed)
Transition of Care [facility]) - Progression Note    Patient Details  Name: Teresa Holmes MRN: [id number] Date of Birth: 05-12-1950  Transition of [facility]) CM/SW [place], RN Phone Number: 11/13/2019, 3:34 PM  Clinical Narrative:   Patient now wants SNF after discussion with HCPOA. RNCM verified PASSR, completed fL2 and started bed search.     Expected Discharge Plan: [place] Barriers to Discharge: No Barriers Identified  Expected Discharge Plan and Services Expected Discharge Plan: [place] arrangements for the past 2 months: Single Family Home Expected Discharge Date: 11/13/19                         HH Arranged: RN, PT, OT, Nurse's Aide [place] Agency: [place] ([place]) Date [place]: 11/13/19 Time [place]: [phone] Representative spoke with at [place]: [place] ([place]) Interventions    Readmission Risk Interventions No flowsheet data found.

## 2019-11-13 NOTE — NC FL2 (Signed)
[facility] LEVEL OF CARE SCREENING TOOL     IDENTIFICATION  Patient Name: Teresa Holmes Birthdate: [birth year] Sex: female Admission Date (Current Location): 11/10/2019  [place] and [place] Number:  Engineering geologist and Address:  [facility], [address] [place], [place] [address]      Provider Number: [id number]  Attending Physician Name and Address:  Shelly Coss, MD  Relative Name and Phone Number:  [phone]    Current Level of Care: Hospital Recommended Level of Care: [place] Prior Approval Number:    Date Approved/Denied:   PASRR Number: [id number]  Discharge Plan: SNF    Current Diagnoses: Patient Active Problem List   Diagnosis Date Noted  . Hip fracture ([place]) 11/10/2019  . Closed rib fracture 11/10/2019  . Acute blood loss anemia 11/10/2019  . AF (paroxysmal atrial fibrillation) ([place]) 11/10/2019  . CVA, old, hemiparesis ([place]) 11/10/2019  . Wound of skin 11/10/2019  . Pulmonary emphysema ([place]) 10/23/2019  . Pelvic fracture ([place]) 09/28/2018  . Encounter for long-term (current) use of medications 05/11/2017  . Chronic pain disorder 05/11/2017  . Generalized anxiety disorder 03/14/2017  . Essential (primary) hypertension 03/14/2017  . Hemiplegia and hemiparesis following cerebral infarction affecting left non-dominant side ([place]) 03/14/2017  . Vitamin D deficiency, unspecified 03/14/2017  . Personal history of venous thrombosis and embolism 03/14/2017  . Congenital spondylolisthesis 03/14/2017  . Nicotine dependence, cigarettes, uncomplicated 80/04/4915  . Mixed hyperlipidemia 03/14/2017  . Long term (current) use of anticoagulants 03/14/2017  . Other intervertebral disc degeneration, thoracolumbar region 03/14/2017  . Lumbago with sciatica 03/14/2017  . Cardiac arrhythmia 03/14/2017  . Myalgia 03/14/2017  . Hypothyroidism 03/14/2017  . Difficulty walking 03/14/2017  . Left knee pain  03/14/2017    Orientation RESPIRATION BLADDER Height & Weight     Self, Time, Situation, Place  Normal External catheter Weight: 37 kg Height:  4\' 9"  (144.8 cm)  BEHAVIORAL SYMPTOMS/MOOD NEUROLOGICAL BOWEL NUTRITION STATUS      Continent Diet (Heart Healthy, Carb Modified)  AMBULATORY STATUS COMMUNICATION OF NEEDS Skin   Extensive Assist Verbally Surgical wounds                       Personal Care Assistance Level of Assistance  Bathing, Dressing, Feeding Bathing Assistance: Maximum assistance Feeding assistance: Limited assistance Dressing Assistance: Maximum assistance     Functional Limitations Info  Sight, Speech, Hearing Sight Info: Adequate Hearing Info: Adequate Speech Info: Adequate    SPECIAL CARE FACTORS FREQUENCY  PT (By licensed PT), OT (By licensed OT)                    Contractures Contractures Info: Not present    Additional Factors Info  Code Status, Allergies Code Status Info: Full Allergies Info: Aspirin, Sulfa           Current Medications (11/13/2019):  This is the current hospital active medication list Current Facility-Administered Medications  Medication Dose Route Frequency Provider Last Rate Last Admin  . acetaminophen (TYLENOL) tablet 325-650 mg  325-650 mg Oral Q6H PRN Poggi, Marshall Cork, MD      . atorvastatin (LIPITOR) tablet 40 mg  40 mg Oral Daily Poggi, Marshall Cork, MD   40 mg at 11/12/19 2106  . bisacodyl (DULCOLAX) suppository 10 mg  10 mg Rectal Daily PRN Poggi, Marshall Cork, MD      . calcium carbonate (TUMS - dosed in mg elemental calcium) chewable tablet 200  mg of elemental calcium  1 tablet Oral TID PRN Sharion Settler, NP   200 mg of elemental calcium at 11/13/19 0640  . diphenhydrAMINE (BENADRYL) 12.5 MG/5ML elixir 12.5-25 mg  12.5-25 mg Oral Q4H PRN Poggi, Marshall Cork, MD      . docusate sodium (COLACE) capsule 100 mg  100 mg Oral BID Corky Mull, MD   100 mg at 11/12/19 2106  . fenofibrate tablet 160 mg  160 mg Oral Daily Poggi,  Marshall Cork, MD   160 mg at 11/12/19 2106  . HYDROcodone-acetaminophen (NORCO/VICODIN) 5-325 MG per tablet 1-2 tablet  1-2 tablet Oral Q6H PRN Poggi, Marshall Cork, MD   1 tablet at 11/13/19 0956  . levothyroxine (SYNTHROID) tablet 50 mcg  50 mcg Oral QAC breakfast Poggi, Marshall Cork, MD   50 mcg at 11/13/19 0527  . magnesium hydroxide (MILK OF MAGNESIA) suspension 30 mL  30 mL Oral Daily PRN Poggi, Marshall Cork, MD      . metoCLOPramide (REGLAN) tablet 5-10 mg  5-10 mg Oral Q8H PRN Poggi, Marshall Cork, MD       Or  . metoCLOPramide (REGLAN) injection 5-10 mg  5-10 mg Intravenous Q8H PRN Poggi, Marshall Cork, MD      . metoprolol tartrate (LOPRESSOR) tablet 12.5 mg  12.5 mg Oral BID Corky Mull, MD   12.5 mg at 11/13/19 0947  . morphine 2 MG/ML injection 0.5-1 mg  0.5-1 mg Intravenous Q2H PRN Poggi, Marshall Cork, MD      . ondansetron (ZOFRAN) tablet 4 mg  4 mg Oral Q6H PRN Poggi, Marshall Cork, MD       Or  . ondansetron (ZOFRAN) injection 4 mg  4 mg Intravenous Q6H PRN Poggi, Marshall Cork, MD      . polyethylene glycol (MIRALAX / GLYCOLAX) packet 17 g  17 g Oral Daily Shelly Coss, MD   17 g at 11/13/19 0947  . sodium phosphate (FLEET) 7-19 GM/118ML enema 1 enema  1 enema Rectal Once PRN Poggi, Marshall Cork, MD      . traMADol Veatrice [place]) tablet 50 mg  50 mg Oral Q6H PRN Poggi, Marshall Cork, MD      . warfarin (COUMADIN) tablet 1 mg  1 mg Oral ONCE-1600 Shanlever, Pierce Crane, [facility]      . Warfarin - Pharmacist Dosing Inpatient   Does not apply [id number] Rauer, Forde Dandy, [facility]         Discharge Medications: Please see discharge summary for a list of discharge medications.  Relevant Imaging Results:  Relevant Lab Results:   Additional Information [id number]  Shelbie Ammons, RN

## 2019-11-13 NOTE — Progress Notes (Signed)
Subjective: 2 Days Post-Op Procedure(s) (LRB): ARTHROPLASTY BIPOLAR HIP (HEMIARTHROPLASTY) (Left) Patient reports pain as mild.   Patient is well, and has had no acute complaints or problems Patient will need SNF upon discharge, patient has history of left hemiparesis following a stroke. Patient was initially wanting to go home but is now open for discharge to SNF. Negative for chest pain and shortness of breath Fever: no Gastrointestinal:Negative for nausea and vomiting  Objective: Vital signs in last 24 hours: Temp:  [98.4 F (36.9 C)-99.6 F (37.6 C)] 99.6 F (37.6 C) (10/12 0823) Pulse Rate:  [67-85] 85 (10/12 0823) Resp:  [18] 18 (10/12 0823) BP: (131-146)/(63-86) 146/86 (10/12 0823) SpO2:  [98 %-100 %] 100 % (10/12 0823)  Intake/Output from previous day:  Intake/Output Summary (Last 24 hours) at 11/13/2019 1602 Last data filed at 11/13/2019 1357 Gross per 24 hour  Intake 360 ml  Output 1200 ml  Net -840 ml    Intake/Output this shift: Total I/O In: 360 [P.O.:360] Out: 700 [Urine:700]  Labs: Recent Labs    11/10/19 1603 11/10/19 2245 11/11/19 0500 11/12/19 0331 11/13/19 0355  HGB 5.8* 7.5* 7.2* 9.1* 8.3*   Recent Labs    11/12/19 0331 11/13/19 0355  WBC 10.4 9.0  RBC 2.91* 2.76*  HCT 26.9* 26.9*  PLT 298 282   Recent Labs    11/11/19 0500 11/12/19 0331  NA 132* 132*  K 3.5 3.8  CL 98 102  CO2 26 22  BUN 19 18  CREATININE 0.47 0.69  GLUCOSE 94 88  CALCIUM 7.4* 7.7*   Recent Labs    11/12/19 0331 11/13/19 0355  INR 1.2 2.2*     EXAM General - Patient is Alert, Appropriate and Oriented Extremity - ABD soft Sensation intact distally Intact pulses distally Incision: scant drainage No cellulitis present  Patient is unable to perform any active dorsiflexion or plantarflexion to the left foot but sensation is intact. Dressing/Incision - blood tinged drainage Motor Function - intact, moving foot and toes well on exam.  Abdomen soft to  palpation.  Past Medical History:  Diagnosis Date  . Aneurysm ([place])   . Anxiety   . Arthritis   . Cancer ([place])   . CHF (congestive heart failure) ([place])   . COPD (chronic obstructive pulmonary disease) ([place])   . Hyperlipidemia   . Hypertension   . Stroke ([place])   . Thyroid disease     Assessment/Plan: 2 Days Post-Op Procedure(s) (LRB): ARTHROPLASTY BIPOLAR HIP (HEMIARTHROPLASTY) (Left) Principal Problem:   Hip fracture ([facility]) Active Problems:   Hypothyroidism   Closed rib fracture   Acute blood loss anemia   AF (paroxysmal atrial fibrillation) ([facility])   CVA, old, hemiparesis ([place])   Wound of skin  Estimated body mass index is 17.65 kg/m as calculated from the following:   Height as of this encounter: 4\' 9"  (1.448 m).   Weight as of this encounter: 37 kg. Advance diet Up with therapy   Labs reviewed this AM. Hg 8.3 this AM. Up with therapy, patient with history of left hemiparesis. Will need SNF upon discharge. Begin working on BM.  DVT Prophylaxis - Coumadin and Foot Pumps Weight-Bearing as tolerated to left leg  J. Cameron Proud, PA-C [facility] Orthopaedic Surgery 11/13/2019, 4:02 PM

## 2019-11-13 NOTE — Progress Notes (Signed)
Physical Therapy Treatment Patient Details Name: Teresa Holmes MRN: [id number] DOB: 01-15-1951 Today's Date: 11/13/2019    History of Present Illness Corena Blahnik is a 69 y/o female who was admitted for mechanical fall which resulted in subcapital fracture of L femoral neck. Pt underwent L posterior hemiarthroplasty on 10/10. PMH includes CVA w/ L resuidual hemiplegia, COPD, CAD s/p MIs, paroxysmal A-fib on Coumadin, hypothyroidism, aneurism, anxiety, arthritis, cancer, CHF, and HLD.    PT Comments    Pt is making limited progress towards goals. Pt finished breakfast upon entering room and perseverates on being cold despite therapist applying 3 blankets and increasing room temp. Pt very hesitant to participate and only tolerates sitting at EOB for approx 2 minutes. Max/total assist for mobility and repositioning in bed. Pillows donned to elevated L UE/LE. Pt clearly not at baseline level. Educated on mobility and caregiver need. Pt adamantly refusing SNF placement at this time. Will continue to progress as able.   Follow Up Recommendations  SNF     Equipment Recommendations  None recommended by PT    Recommendations for Other Services       Precautions / Restrictions Precautions Precautions: Fall;Posterior Hip Precaution Booklet Issued: No Restrictions Weight Bearing Restrictions: Yes LLE Weight Bearing: Weight bearing as tolerated    Mobility  Bed Mobility Overal bed mobility: Needs Assistance Bed Mobility: Supine to Sit;Sit to Supine     Supine to sit: Max assist Sit to supine: Max assist   General bed mobility comments: Pt very hesitant to perform mobility and initially refuses. Max assist for transitioning to EOB. Once seated, she immediately states "please let me lie down". Only able to tolerate seated position for 2 minutes.. Max assist to return supine and repositioned in bed  Transfers                 General transfer comment: Did not perform transfer due  to patient's pain level  Ambulation/Gait                 Stairs             Wheelchair Mobility    Modified Rankin (Stroke Patients Only)       Balance Overall balance assessment: Needs assistance Sitting-balance support: Feet unsupported;Single extremity supported Sitting balance-Leahy Scale: Poor Sitting balance - Comments: poor sitting balance, heavy sacral sitting, post leaning                                    Cognition Arousal/Alertness: Awake/alert Behavior During Therapy: WFL for tasks assessed/performed;Anxious Overall Cognitive Status: Within Functional Limits for tasks assessed                                        Exercises Other Exercises Other Exercises: heavy encouragement to participate including R LE AP, SLRs, heel slides and hip abd/add. All performed with supervision. L LE ther-ex performed including attempted QS, SLRs, and hip abd/add. 5 reps each due to heavy pain and pt refusal. Max assist required    General Comments General comments (skin integrity, edema, etc.): bruising and edema noted to L UE      Pertinent Vitals/Pain Pain Assessment: 0-10 Pain Score: 8  Pain Location: L hip and back Pain Descriptors / Indicators: Aching;Discomfort;Grimacing;Sore Pain Intervention(s): Limited activity within patient's tolerance;Repositioned  Home Living                      Prior Function            PT Goals (current goals can now be found in the care plan section) Acute Rehab PT Goals Patient Stated Goal: to go home PT Goal Formulation: With patient Time For Goal Achievement: 11/26/19 Potential to Achieve Goals: Fair Progress towards PT goals: Progressing toward goals    Frequency    7X/week      PT Plan Current plan remains appropriate    Co-evaluation              AM-PAC PT "6 Clicks" Mobility   Outcome Measure  Help needed turning from your back to your side while in a  flat bed without using bedrails?: A Lot Help needed moving from lying on your back to sitting on the side of a flat bed without using bedrails?: A Lot Help needed moving to and from a bed to a chair (including a wheelchair)?: Total Help needed standing up from a chair using your arms (e.g., wheelchair or bedside chair)?: Total Help needed to walk in hospital room?: Total Help needed climbing 3-5 steps with a railing? : Total 6 Click Score: 8    End of Session   Activity Tolerance: Patient limited by pain Patient left: in bed;with call bell/phone within reach (bed alarm not working) Nurse Communication: Mobility status PT Visit Diagnosis: Unsteadiness on feet (R26.81);Other abnormalities of gait and mobility (R26.89);Muscle weakness (generalized) (M62.81);History of falling (Z91.81);Hemiplegia and hemiparesis;Pain Hemiplegia - Right/Left: Left Hemiplegia - dominant/non-dominant: Non-dominant Hemiplegia - caused by: Cerebral infarction Pain - Right/Left: Left Pain - part of body: Hip     Time: 2119-4174 PT Time Calculation (min) (ACUTE ONLY): 19 min  Charges:  $Therapeutic Exercise: 8-22 mins                     Greggory Stallion, PT, DPT [phone]    Lonie Newsham 11/13/2019, 9:16 AM

## 2019-11-13 NOTE — Consult Note (Addendum)
[facility] for Warfarin Indication: CVA  Allergies  Allergen Reactions  . Aspirin Other (See Comments)    ulcer  . Sulfa Antibiotics Other (See Comments)    unknown    Patient Measurements: Height: 4\' 9"  (144.8 cm) Weight: 37 kg (81 lb 9.1 oz) IBW/kg (Calculated) : 38.6  Vital Signs: Temp: 98.4 F (36.9 C) (10/12 0029) Temp Source: Oral (10/12 0029) BP: 131/63 (10/12 0029) Pulse Rate: 67 (10/12 0029)  Labs: Recent Labs    11/10/19 1603 11/10/19 2245 11/11/19 0500 11/11/19 0500 11/11/19 1050 11/12/19 0331 11/13/19 0355  HGB 5.8*   < > 7.2*   < >  --  9.1* 8.3*  HCT 19.8*   < > 22.9*  --   --  26.9* 26.9*  PLT 544*  --  415*  --   --  298 282  LABPROT 23.7*   < > 16.0*   < > 14.9 14.7 23.4*  INR 2.2*   < > 1.3*   < > 1.2 1.2 2.2*  CREATININE 0.64  --  0.47  --   --  0.69  --    < > = values in this interval not displayed.    Estimated Creatinine Clearance: 38.2 mL/min (by C-G formula based on SCr of 0.69 mg/dL).   Medical History: Past Medical History:  Diagnosis Date  . Aneurysm ([place])   . Anxiety   . Arthritis   . Cancer ([place])   . CHF (congestive heart failure) ([place])   . COPD (chronic obstructive pulmonary disease) ([place])   . Hyperlipidemia   . Hypertension   . Stroke ([place])   . Thyroid disease     Medications:  PTA warfarin 1mg  daily  Assessment: 69yo female presented with L hip pain. PMH COPD, anemia, CHF, HLD, HTN, hypothyroidism, and left hemiparesis following a stroke (on warfarin). Patient's last dose of warfarin was her home dose of 1mg  on 10/8. Patient received IV vitamin K 1mg  x2 doses in anticipation for procedure (arthroplasty bipolar hip) and warfarin was held.   Pharmacy has been consulted for warfarin dosing and monitoring.   Slight drop in HGB from yesterday 9.1>8.3 - will monitor  Platelets stable @ 282  Date INR  Dose 10/9 1.7>1.3 Held 10/10 1.2  2mg  10/11  1.2  2mg  10/12  2.2  Goal of  Therapy:  INR 2-3 Monitor platelets by anticoagulation protocol: Yes   Plan:  INR 2.2 is therapeutic.   Will give 1mg  dose tonight (resuming home dose).    On LMWH DVT prophylaxis while subtherapeutic-dc'ed this am.  Monitor daily INR and [place], PharmD, BCPS Clinical Pharmacist 11/13/2019 7:00 AM

## 2019-11-13 NOTE — Discharge Summary (Signed)
Physician Discharge Summary  Teresa Holmes ZYY:[id number] DOB: [birth year] DOA: 11/10/2019  PCP: Ronnell Freshwater, NP  Admit date: 11/10/2019 Discharge date: 11/13/2019  Admitted From: Home Disposition:  Home  Discharge Condition:Stable CODE STATUS:FULL Diet recommendation: Heart Healthy  Brief/Interim Summary: Patient is 69 year old female with history of nonhemorrhagic CVA with residual left hemiplegia, COPD, coronary artery disease, paroxysmal A. fib on Coumadin, hypothyroidism who presents from home with complaints of fall several days ago and decided to present to the emergency department after persistent left hip pain.  She normally ambulates with a cane, lives alone and has a daytime caretaker.  Patient reported significant swelling of the left arm since the fall.  On presentation she was hypotensive, anemic with hemoglobin of 5.8, baseline of 7.  Chest x-ray showed several left rib fractures mainly in the fourth and sixth rib.  Left hip x-ray showed superiorly displaced subcapital  fracture of the left femoral neck.  Underwent Lefthip unipolar hemiarthroplasty on 11/11/2019.  PT/OT consulted with plan to discharge to skilled nursing facility.    She denied discharge to SNF and wanted home health.  She is medically stable for discharge today.  Following problems were addressed during her hospitalization:  Fall/left femur fracture: Left hip x-ray showed superiorly displaced subcapital  fracture of the left femoral neck.  Orthopedics consulted and she underwent lefthip unipolar hemiarthroplastywith repair of left greater trochanter fracture. PT/OTordered.  On coumadin for Dvt ppx PT/OT recommended a skilled nursing facility on discharge.  She states she does not want to go to rehab and wants to go home.  She needs to follow-up with orthopedics in 2 weeks.  Left-sided rib fractures: Continue pain management, supportive care.  Incentive spirometry.  Left upper extremity skin  tear/swelling: Ultrasound showed hematoma, no fractures or DVT.  Continue supportive care.    Acute blood loss normocytic anemia:   Presented with hemoglobin of 5.8, most likely associated with blood loss from hip fracture.  She was given a unit of PRBC .  Hemoglobin in the range of 8 today.  Check CBC in a week  Paroxysmal A. fib: On Coumadin for anticoagulation.  Coumadin resumed.   On metoprolol for rate control.  History of nonhemorrhagic CVA: Has left-sided residual hemiplegia.  Ambulates with a cane.  PT/OT consulted here.  Hypothyroidism: Continue Synthyroid   Discharge Diagnoses:  Principal Problem:   Hip fracture ([place]) Active Problems:   Hypothyroidism   Closed rib fracture   Acute blood loss anemia   AF (paroxysmal atrial fibrillation) ([facility])   CVA, old, hemiparesis ([place])   Wound of skin    Discharge Instructions  Discharge Instructions    Diet - low sodium heart healthy   Complete by: As directed    Discharge instructions   Complete by: As directed    1)Please follow-up with your PCP in a week.  Do a CBC test during the follow-up 2) follow-up with orthopedics in 2 weeks.  Name and number of the provider has been attached. 3)Follow up with home health services.   Increase activity slowly   Complete by: As directed    No wound care   Complete by: As directed      Allergies as of 11/13/2019      Reactions   Aspirin Other (See Comments)   ulcer   Sulfa Antibiotics Other (See Comments)   unknown      Medication List    TAKE these medications   atorvastatin 40 MG tablet Commonly known as: LIPITOR  Take 1 tablet (40 mg total) by mouth daily.   fenofibrate 145 MG tablet Commonly known as: TRICOR Take 1 tablet (145 mg total) by mouth daily.   furosemide 20 MG tablet Commonly known as: LASIX 20 MG FOR 3 DAYS THEN ONE EVERY OTHER DAY   HYDROcodone-acetaminophen 5-325 MG tablet Commonly known as: NORCO/VICODIN Take 1-2 tablets by mouth every 6 (six)  hours as needed for moderate pain.   levothyroxine 50 MCG tablet Commonly known as: SYNTHROID Take 1 tablet (50 mcg total) by mouth daily before breakfast.   metoprolol tartrate 25 MG tablet Commonly known as: LOPRESSOR Take 0.5 tablets (12.5 mg total) by mouth 2 (two) times daily.   polyethylene glycol 17 g packet Commonly known as: MIRALAX / GLYCOLAX Take 17 g by mouth daily. Start taking on: November 14, 2019   warfarin 1 MG tablet Commonly known as: COUMADIN Take 1 mg by mouth daily.       Follow-up Information    Ronnell Freshwater, NP. Schedule an appointment as soon as possible for a visit in 1 week(s).   Specialty: Family Medicine Contact information: [address] [place] [address] [phone]        Poggi, Marshall Cork, MD. Schedule an appointment as soon as possible for a visit in 2 week(s).   Specialty: Orthopedic Surgery Contact information: [place] [address] [phone]              Allergies  Allergen Reactions  . Aspirin Other (See Comments)    ulcer  . Sulfa Antibiotics Other (See Comments)    unknown    Consultations:  Orthopedics   Procedures/Studies: DG Chest 1 View  Result Date: 11/10/2019 CLINICAL DATA:  Fall 2 days ago.  Left arm wound. EXAM: CHEST  1 VIEW COMPARISON:  Chest radiographs 09/28/2018 and 01/18/2006. Abdominal radiographs 09/30/2018 FINDINGS: 1640 hours. Mild patient rotation to the left. The heart size and mediastinal contours are stable status post median sternotomy. The lungs appear clear. There is no pleural effusion or pneumothorax. There are several left rib fractures laterally which may be subacute, notably in the 4th and 6th ribs. Fractures of the 7th and 8th ribs may be acute. IMPRESSION: Several left-sided rib fractures, at least some likely subacute. No pleural effusion or pneumothorax. Electronically Signed   By: Richardean Sale M.D.   On: 11/10/2019 17:40   DG  Pelvis 1-2 Views  Result Date: 11/10/2019 CLINICAL DATA:  69 year old female with fall. EXAM: PELVIS - 1-2 VIEW COMPARISON:  Pelvic radiograph dated 09/28/2018. FINDINGS: There is a displaced fracture of the left femoral neck with proximal migration of the femoral shaft. No dislocation. There is advanced osteopenia. Moderate stool noted in the rectal vault. The soft tissues are unremarkable. IMPRESSION: Displaced fracture of the left femoral neck. Electronically Signed   By: Anner Crete M.D.   On: 11/10/2019 17:32   DG Forearm Left  Result Date: 11/10/2019 CLINICAL DATA:  69 year old female with fall and trauma to the left upper extremity. EXAM: LEFT FOREARM - 2 VIEW COMPARISON:  None. FINDINGS: Evaluation is very limited due to advanced osteopenia. No definite acute fracture or dislocation. Old appearing fracture deformity of the distal radius. Clinical correlation is recommended. The bones are osteopenic. There is diffuse subcutaneous soft tissue edema. No radiopaque foreign object or soft tissue gas. IMPRESSION: 1. No definite acute fracture or dislocation. Old appearing fracture of the distal radius. 2. Diffuse subcutaneous soft tissue edema. Electronically Signed  By: Anner Crete M.D.   On: 11/10/2019 17:30   CT Head Wo Contrast  Result Date: 11/10/2019 CLINICAL DATA:  Fall 2 days ago EXAM: CT HEAD WITHOUT CONTRAST TECHNIQUE: Contiguous axial images were obtained from the base of the skull through the vertex without intravenous contrast. COMPARISON:  August 14, 2019 FINDINGS: Brain: No evidence of acute infarction, hemorrhage, hydrocephalus, extra-axial collection or mass lesion/mass effect. Sequela of large RIGHT MCA infarction with encephalomalacia and cortical gliosis involving the RIGHT temporal and frontoparietal lobes. Periventricular white matter hypodensities consistent with sequela of chronic microvascular ischemic disease. Remote RIGHT cerebellar infarction. Vascular: No hyperdense  vessel or unexpected calcification. Skull: Normal. Negative for fracture or focal lesion. Sinuses/Orbits: No acute finding. Other: None. IMPRESSION: 1.  No acute intracranial abnormality. Electronically Signed   By: Valentino Saxon MD   On: 11/10/2019 17:24   US Venous Img Upper Uni Left  Result Date: 11/10/2019 CLINICAL DATA:  Fall, redness and swelling of the left upper extremity EXAM: LEFT UPPER EXTREMITY VENOUS DOPPLER ULTRASOUND TECHNIQUE: Gray-scale sonography with graded compression, as well as color Doppler and duplex ultrasound were performed to evaluate the upper extremity deep venous system from the level of the subclavian vein and including the jugular, axillary, basilic, radial, ulnar and upper cephalic vein. Spectral Doppler was utilized to evaluate flow at rest and with distal augmentation maneuvers. COMPARISON:  None. FINDINGS: Contralateral Subclavian Vein: Respiratory phasicity is normal and symmetric with the symptomatic side. No evidence of thrombus. Normal compressibility. Internal Jugular Vein: No evidence of thrombus. Normal compressibility, respiratory phasicity and response to augmentation. Subclavian Vein: No evidence of thrombus. Normal compressibility, respiratory phasicity and response to augmentation. Axillary Vein: No evidence of thrombus. Normal compressibility, respiratory phasicity and response to augmentation. Cephalic Vein: No evidence of thrombus. Normal compressibility, respiratory phasicity and response to augmentation. Basilic Vein: No evidence of thrombus. Normal compressibility, respiratory phasicity and response to augmentation. Brachial Veins: No evidence of thrombus. Normal compressibility, respiratory phasicity and response to augmentation. Radial Veins: No evidence of thrombus. Normal compressibility, respiratory phasicity and response to augmentation. Ulnar Veins: No evidence of thrombus. Normal compressibility, respiratory phasicity and response to  augmentation. Venous Reflux:  None visualized. Other Findings: There is a hypoechoic collection seen in the region of the left axilla at the level of though separate from the axillary vein which could reflect a hematoma or seroma though is overall indeterminate on these ultrasound images. Additionally, there is diffuse soft tissue edema of the left upper extremity most pronounced at the level of the upper arm. IMPRESSION: 1. No evidence of DVT within the left upper extremity. 2. Hypoechoic collection in the region of the left axilla measuring 2.9 by 1.0 cm in transverse dimensions (image 12/37) could reflect a hematoma in the setting of trauma though is indeterminate on these ultrasound images. Could consider further evaluation with cross-sectional imaging. 3. Diffuse soft tissue edema of the left upper extremity, most pronounced at the level of the upper arm. Electronically Signed   By: Lovena Le M.D.   On: 11/10/2019 17:55   DG HIP UNILAT W OR W/O PELVIS 2-3 VIEWS LEFT  Result Date: 11/11/2019 CLINICAL DATA:  Postop, left hip arthroplasty EXAM: DG HIP (WITH OR WITHOUT PELVIS) 2-3V LEFT COMPARISON:  Radiograph 11/10/2019 FINDINGS: Patient appears to be post left hip hemiarthroplasty for the repair of the previously seen displaced femoral neck fracture. Articulating femoral component appears normally seated within the acetabulum. Few adjacent postsurgical ossifications are present. Associated postsurgical soft tissue  changes including soft tissue gas, intra-articular gas and overlying skin staples are present. Additional surgical clips noted in the medial thighs, likely related to prior vascular surgeries. Remaining bones of the pelvis are intact and congruent. The osseous structures appear diffusely demineralized which may limit detection of small or nondisplaced fractures. IMPRESSION: Status post left hip hemiarthroplasty without evidence of acute hardware complication. Electronically Signed   By: Lovena Le M.D.   On: 11/11/2019 19:35   DG Femur Min 2 Views Left  Result Date: 11/10/2019 CLINICAL DATA:  Fall 2 days ago.  Hip deformity. EXAM: LEFT FEMUR 2 VIEWS COMPARISON:  Pelvic radiographs same date. One view abdomen 07/15/2019. FINDINGS: The bones are diffusely demineralized. There is a superiorly displaced subcapital fracture of the left femoral neck. The fracture margins are somewhat ill-defined, and this could be subacute in age. The distal femur is intact. The visualized bony pelvis is intact. Mild degenerative changes are present at the left hip and knee. There are vascular clips in the left groin and medial thigh. IMPRESSION: Superiorly displaced subcapital fracture of the left femoral neck, possibly subacute in age. Electronically Signed   By: Richardean Sale M.D.   On: 11/10/2019 17:36       Subjective:  Patient seen and examined at the bedside this morning.  Hemodynamically stable.  Pain well controlled.  Stable for discharge today.  Discharge Exam: Vitals:   11/13/19 0029 11/13/19 0823  BP: 131/63 (!) 146/86  Pulse: 67 85  Resp:  18  Temp: 98.4 F (36.9 C) 99.6 F (37.6 C)  SpO2: 98% 100%   Vitals:   11/12/19 0738 11/12/19 1525 11/13/19 0029 11/13/19 0823  BP: (!) 147/63 (!) 114/53 131/63 (!) 146/86  Pulse: 64 65 67 85  Resp: 17 16  18   Temp: 99 F (37.2 C) 99.2 F (37.3 C) 98.4 F (36.9 C) 99.6 F (37.6 C)  TempSrc: Oral Oral Oral   SpO2: 97% 97% 98% 100%  Weight:      Height:        General: Pt is alert, awake, not in acute distress Cardiovascular: RRR, S1/S2 +, no rubs, no gallops Respiratory: CTA bilaterally, no wheezing, no rhonchi Abdominal: Soft, NT, ND, bowel sounds + Extremities: no edema, no cyanosis, clean surgical wound on the left hip, left upper extremity edema but improving.    The results of significant diagnostics from this hospitalization (including imaging, microbiology, ancillary and laboratory) are listed below for reference.      Microbiology: Recent Results (from the past 240 hour(s))  Respiratory Panel by RT PCR (Flu A&B, Covid) - Nasopharyngeal Swab     Status: None   Collection Time: 11/10/19  4:44 PM   Specimen: Nasopharyngeal Swab  Result Value Ref Range Status   SARS Coronavirus 2 by RT PCR NEGATIVE NEGATIVE Final    Comment: (NOTE) SARS-CoV-2 target nucleic acids are NOT DETECTED.  The SARS-CoV-2 RNA is generally detectable in upper respiratoy specimens during the acute phase of infection. The lowest concentration of SARS-CoV-2 viral copies this assay can detect is 131 copies/mL. A negative result does not preclude SARS-Cov-2 infection and should not be used as the sole basis for treatment or other patient management decisions. A negative result may occur with  improper specimen collection/handling, submission of specimen other than nasopharyngeal swab, presence of viral mutation(s) within the areas targeted by this assay, and inadequate number of viral copies (<131 copies/mL). A negative result must be combined with clinical observations, patient history, and epidemiological  information. The expected result is Negative.  Fact Sheet for Patients:  PinkCheek.be  Fact Sheet for Healthcare Providers:  GravelBags.it  This test is no t yet approved or cleared by the [place] FDA and  has been authorized for detection and/or diagnosis of SARS-CoV-2 by FDA under an Emergency Use Authorization (EUA). This EUA will remain  in effect (meaning this test can be used) for the duration of the COVID-19 declaration under Section 564(b)(1) of the Act, 21 U.S.C. section 360bbb-3(b)(1), unless the authorization is terminated or revoked sooner.     Influenza A by PCR NEGATIVE NEGATIVE Final   Influenza B by PCR NEGATIVE NEGATIVE Final    Comment: (NOTE) The Xpert Xpress SARS-CoV-2/FLU/RSV assay is intended as an aid in  the diagnosis of  influenza from Nasopharyngeal swab specimens and  should not be used as a sole basis for treatment. Nasal washings and  aspirates are unacceptable for Xpert Xpress SARS-CoV-2/FLU/RSV  testing.  Fact Sheet for Patients: PinkCheek.be  Fact Sheet for Healthcare Providers: GravelBags.it  This test is not yet approved or cleared by the [place] FDA and  has been authorized for detection and/or diagnosis of SARS-CoV-2 by  FDA under an Emergency Use Authorization (EUA). This EUA will remain  in effect (meaning this test can be used) for the duration of the  Covid-19 declaration under Section 564(b)(1) of the Act, 21  U.S.C. section 360bbb-3(b)(1), unless the authorization is  terminated or revoked. Performed at [facility], [address] [place], [place] [address]      Labs: BNP (last 3 results) No results for input(s): BNP in the last 8760 hours. Basic Metabolic Panel: Recent Labs  Lab 11/10/19 1603 11/11/19 0500 11/12/19 0331  NA 133* 132* 132*  K 3.3* 3.5 3.8  CL 98 98 102  CO2 25 26 22   GLUCOSE 115* 94 88  BUN 24* 19 18  CREATININE 0.64 0.47 0.69  CALCIUM 7.6* 7.4* 7.7*   Liver Function Tests: Recent Labs  Lab 11/10/19 1603  AST 40  ALT 19  ALKPHOS 160*  BILITOT 1.4*  PROT 5.4*  ALBUMIN 2.1*   No results for input(s): LIPASE, AMYLASE in the last 168 hours. No results for input(s): AMMONIA in the last 168 hours. CBC: Recent Labs  Lab 11/10/19 1603 11/10/19 2245 11/11/19 0500 11/12/19 0331 11/13/19 0355  WBC 8.4  --  7.5 10.4 9.0  NEUTROABS  --   --   --  8.5* 7.3  HGB 5.8* 7.5* 7.2* 9.1* 8.3*  HCT 19.8* 23.7* 22.9* 26.9* 26.9*  MCV 102.1*  --  97.0 92.4 97.5  PLT 544*  --  415* 298 282   Cardiac Enzymes: No results for input(s): CKTOTAL, CKMB, CKMBINDEX, TROPONINI in the last 168 hours. BNP: Invalid input(s): POCBNP CBG: No results for input(s): GLUCAP in the last 168  hours. D-Dimer No results for input(s): DDIMER in the last 72 hours. Hgb A1c No results for input(s): HGBA1C in the last 72 hours. Lipid Profile No results for input(s): CHOL, HDL, LDLCALC, TRIG, CHOLHDL, LDLDIRECT in the last 72 hours. Thyroid function studies No results for input(s): TSH, T4TOTAL, T3FREE, THYROIDAB in the last 72 hours.  Invalid input(s): FREET3 Anemia work up No results for input(s): VITAMINB12, FOLATE, FERRITIN, TIBC, IRON, RETICCTPCT in the last 72 hours. Urinalysis    Component Value Date/Time   COLORURINE YELLOW (A) 10/03/2018 0513   APPEARANCEUR CLEAR (A) 10/03/2018 0513   APPEARANCEUR Clear 09/05/2017 1641   LABSPEC 1.010 10/03/2018  [place] 7.0 10/03/2018 0513   GLUCOSEU >=500 (A) 10/03/2018 0513   HGBUR NEGATIVE 10/03/2018 0513   BILIRUBINUR NEGATIVE 10/03/2018 0513   BILIRUBINUR Negative 09/05/2017 1641   KETONESUR NEGATIVE 10/03/2018 0513   PROTEINUR NEGATIVE 10/03/2018 0513   NITRITE NEGATIVE 10/03/2018 0513   LEUKOCYTESUR SMALL (A) 10/03/2018 0513   Sepsis Labs Invalid input(s): PROCALCITONIN,  WBC,  LACTICIDVEN Microbiology Recent Results (from the past 240 hour(s))  Respiratory Panel by RT PCR (Flu A&B, Covid) - Nasopharyngeal Swab     Status: None   Collection Time: 11/10/19  4:44 PM   Specimen: Nasopharyngeal Swab  Result Value Ref Range Status   SARS Coronavirus 2 by RT PCR NEGATIVE NEGATIVE Final    Comment: (NOTE) SARS-CoV-2 target nucleic acids are NOT DETECTED.  The SARS-CoV-2 RNA is generally detectable in upper respiratoy specimens during the acute phase of infection. The lowest concentration of SARS-CoV-2 viral copies this assay can detect is 131 copies/mL. A negative result does not preclude SARS-Cov-2 infection and should not be used as the sole basis for treatment or other patient management decisions. A negative result may occur with  improper specimen collection/handling, submission of specimen other than  nasopharyngeal swab, presence of viral mutation(s) within the areas targeted by this assay, and inadequate number of viral copies (<131 copies/mL). A negative result must be combined with clinical observations, patient history, and epidemiological information. The expected result is Negative.  Fact Sheet for Patients:  PinkCheek.be  Fact Sheet for Healthcare Providers:  GravelBags.it  This test is no t yet approved or cleared by the [place] FDA and  has been authorized for detection and/or diagnosis of SARS-CoV-2 by FDA under an Emergency Use Authorization (EUA). This EUA will remain  in effect (meaning this test can be used) for the duration of the COVID-19 declaration under Section 564(b)(1) of the Act, 21 U.S.C. section 360bbb-3(b)(1), unless the authorization is terminated or revoked sooner.     Influenza A by PCR NEGATIVE NEGATIVE Final   Influenza B by PCR NEGATIVE NEGATIVE Final    Comment: (NOTE) The Xpert Xpress SARS-CoV-2/FLU/RSV assay is intended as an aid in  the diagnosis of influenza from Nasopharyngeal swab specimens and  should not be used as a sole basis for treatment. Nasal washings and  aspirates are unacceptable for Xpert Xpress SARS-CoV-2/FLU/RSV  testing.  Fact Sheet for Patients: PinkCheek.be  Fact Sheet for Healthcare Providers: GravelBags.it  This test is not yet approved or cleared by the [place] FDA and  has been authorized for detection and/or diagnosis of SARS-CoV-2 by  FDA under an Emergency Use Authorization (EUA). This EUA will remain  in effect (meaning this test can be used) for the duration of the  Covid-19 declaration under Section 564(b)(1) of the Act, 21  U.S.C. section 360bbb-3(b)(1), unless the authorization is  terminated or revoked. Performed at [facility], [address], [place], [place]  [address]     Please note: You were cared for by a hospitalist during your hospital stay. Once you are discharged, your primary care physician will handle any further medical issues. Please note that NO REFILLS for any discharge medications will be authorized once you are discharged, as it is imperative that you return to your primary care physician (or establish a relationship with a primary care physician if you do not have one) for your post hospital discharge needs so that they can reassess your need for medications and monitor your lab values.  Time coordinating discharge: 40 minutes  SIGNED:   Shelly Coss, MD  [facility] 11/13/2019, 11:30 AM Pager [phone]  If 7PM-7AM, please contact night-coverage [email or web address] Password [email or web address]

## 2019-11-13 NOTE — TOC Initial Note (Signed)
Transition of Care [facility]) - Initial/Assessment Note    Patient Details  Name: Teresa Holmes MRN: [id number] Date of Birth: 02/03/1950  [facility]) CM/SW Contact:    Shelbie Ammons, RN Phone Number: 11/13/2019, 8:59 AM  Clinical Narrative:  RNCM met with patient at bedside, patient getting ready to start session with PT. RNCM discussed reason for visit and that therapy dept is recommending she may need short term rehab at discharge. Patient adamently refusing d/c to rehab and reports she will go home, that she has a hired private CG with her every day. Patient is open to having home health set up and does not have a preference as to who. RNCM reached out to patient's CG [name] and she will pick patient up when she is ready to leave the hospital. RNCM reached out to [place] with [facility] and he will accept referral.                  Expected Discharge Plan: [place] Barriers to Discharge: No Barriers Identified   Patient Goals and CMS Choice     Choice offered to / list presented to : Patient  Expected Discharge Plan and Services Expected Discharge Plan: [place] Acute Care Choice: [place] arrangements for the past 2 months: Single Family Home                           HH Arranged: RN, PT, OT, Nurse's Aide [place] Agency: [place] ([place]) Date HH Agency Contacted: 11/13/19 Time [place]: [phone] Representative spoke with at [place]: Corene Cornea  Prior Living Arrangements/Services Living arrangements for the past 2 months: [place] with:: Self Patient language and need for interpreter reviewed:: Yes Do you feel safe going back to the place where you live?: Yes      Need for Family Participation in Patient Care: Yes (Comment) Care giver support system in place?: Yes (comment) Current home services: Homehealth aide Criminal Activity/Legal Involvement Pertinent to Current  Situation/Hospitalization: No - Comment as needed  Activities of Daily Living Home Assistive Devices/Equipment: Grab bars in shower, Dentures (specify type), Shower chair without back, Cane (specify quad or straight) ADL Screening (condition at time of admission) Patient's cognitive ability adequate to safely complete daily activities?: Yes Is the patient deaf or have difficulty hearing?: No Does the patient have difficulty seeing, even when wearing glasses/contacts?: No Does the patient have difficulty concentrating, remembering, or making decisions?: No Patient able to express need for assistance with ADLs?: Yes Does the patient have difficulty dressing or bathing?: Yes Independently performs ADLs?: No Does the patient have difficulty walking or climbing stairs?: Yes Weakness of Legs: Both Weakness of Arms/Hands: Left  Permission Sought/Granted                  Emotional Assessment Appearance:: Appears older than stated age Attitude/Demeanor/Rapport: Engaged Affect (typically observed): Appropriate Orientation: : Oriented to Self, Oriented to Place, Oriented to  Time, Oriented to Situation Alcohol / Substance Use: Not Applicable Psych Involvement: No (comment)  Admission diagnosis:  Hip fracture ([place]) [S72.009A] Fall [W19.XXXA] Closed fracture of left hip, initial encounter ([place]) [S72.002A] Anemia, unspecified type [D64.9] Patient Active Problem List   Diagnosis Date Noted  . Hip fracture ([place]) 11/10/2019  . Closed rib fracture 11/10/2019  . Acute blood loss anemia 11/10/2019  . AF (paroxysmal atrial  fibrillation) ([place]) 11/10/2019  . CVA, old, hemiparesis ([place]) 11/10/2019  . Wound of skin 11/10/2019  . Pulmonary emphysema ([place]) 10/23/2019  . Pelvic fracture ([place]) 09/28/2018  . Encounter for long-term (current) use of medications 05/11/2017  . Chronic pain disorder 05/11/2017  . Generalized anxiety disorder 03/14/2017  . Essential (primary) hypertension 03/14/2017   . Hemiplegia and hemiparesis following cerebral infarction affecting left non-dominant side ([place]) 03/14/2017  . Vitamin D deficiency, unspecified 03/14/2017  . Personal history of venous thrombosis and embolism 03/14/2017  . Congenital spondylolisthesis 03/14/2017  . Nicotine dependence, cigarettes, uncomplicated 25/89/4834  . Mixed hyperlipidemia 03/14/2017  . Long term (current) use of anticoagulants 03/14/2017  . Other intervertebral disc degeneration, thoracolumbar region 03/14/2017  . Lumbago with sciatica 03/14/2017  . Cardiac arrhythmia 03/14/2017  . Myalgia 03/14/2017  . Hypothyroidism 03/14/2017  . Difficulty walking 03/14/2017  . Left knee pain 03/14/2017   PCP:  Ronnell Freshwater, NP Pharmacy:   [facility] [address] [place] [address] [place] [address] Phone: [phone] Fax: [phone]     Social Determinants of Health (SDOH) Interventions    Readmission Risk Interventions No flowsheet data found.

## 2019-11-14 LAB — CBC WITH DIFFERENTIAL/PLATELET
Abs Immature Granulocytes: 0.05 10*3/uL (ref 0.00–0.07)
Basophils Absolute: 0 10*3/uL (ref 0.0–0.1)
Basophils Relative: 0 %
Eosinophils Absolute: 0 10*3/uL (ref 0.0–0.5)
Eosinophils Relative: 1 %
HCT: 26.9 % — ABNORMAL LOW (ref 36.0–46.0)
Hemoglobin: 8.3 g/dL — ABNORMAL LOW (ref 12.0–15.0)
Immature Granulocytes: 1 %
Lymphocytes Relative: 18 %
Lymphs Abs: 1.6 10*3/uL (ref 0.7–4.0)
MCH: 30.1 pg (ref 26.0–34.0)
MCHC: 30.9 g/dL (ref 30.0–36.0)
MCV: 97.5 fL (ref 80.0–100.0)
Monocytes Absolute: 0.4 10*3/uL (ref 0.1–1.0)
Monocytes Relative: 5 %
Neutro Abs: 6.6 10*3/uL (ref 1.7–7.7)
Neutrophils Relative %: 75 %
Platelets: 271 10*3/uL (ref 150–400)
RBC: 2.76 MIL/uL — ABNORMAL LOW (ref 3.87–5.11)
RDW: 19.1 % — ABNORMAL HIGH (ref 11.5–15.5)
WBC: 8.6 10*3/uL (ref 4.0–10.5)
nRBC: 0 % (ref 0.0–0.2)

## 2019-11-14 LAB — PROTIME-INR
INR: 1.8 — ABNORMAL HIGH (ref 0.8–1.2)
Prothrombin Time: 20.6 seconds — ABNORMAL HIGH (ref 11.4–15.2)

## 2019-11-14 MED ORDER — WARFARIN SODIUM 1 MG PO TABS
1.5000 mg | ORAL_TABLET | Freq: Once | ORAL | Status: AC
  Administered 2019-11-14: 1.5 mg via ORAL
  Filled 2019-11-14: qty 1

## 2019-11-14 NOTE — Progress Notes (Signed)
Subjective: 3 Days Post-Op Procedure(s) (LRB): ARTHROPLASTY BIPOLAR HIP (HEMIARTHROPLASTY) (Left) Patient reports pain as mild.   Patient is well, and has had no acute complaints or problems Patient will need SNF upon discharge, patient has history of left hemiparesis following a stroke. Plan is for d/c to SNF. Negative for chest pain and shortness of breath Fever: no Gastrointestinal:Negative for nausea and vomiting  Objective: Vital signs in last 24 hours: Temp:  [98.7 F (37.1 C)-99.6 F (37.6 C)] 99.3 F (37.4 C) (10/13 0755) Pulse Rate:  [85-90] 90 (10/13 0755) Resp:  [17-18] 17 (10/13 0755) BP: (119-147)/(52-86) 131/63 (10/13 0755) SpO2:  [99 %-100 %] 100 % (10/13 0755)  Intake/Output from previous day:  Intake/Output Summary (Last 24 hours) at 11/14/2019 0802 Last data filed at 11/14/2019 0569 Gross per 24 hour  Intake 360 ml  Output 1754 ml  Net -1394 ml    Intake/Output this shift: No intake/output data recorded.  Labs: Recent Labs    11/12/19 0331 11/13/19 0355 11/14/19 0502  HGB 9.1* 8.3* 8.3*   Recent Labs    11/13/19 0355 11/14/19 0502  WBC 9.0 8.6  RBC 2.76* 2.76*  HCT 26.9* 26.9*  PLT 282 271   Recent Labs    11/12/19 0331  NA 132*  K 3.8  CL 102  CO2 22  BUN 18  CREATININE 0.69  GLUCOSE 88  CALCIUM 7.7*   Recent Labs    11/13/19 0355 11/14/19 0502  INR 2.2* 1.8*   EXAM General - Patient is Alert, Appropriate and Oriented Extremity - ABD soft Sensation intact distally Intact pulses distally Incision: moderate drainage No cellulitis present  Patient is unable to perform any active dorsiflexion or plantarflexion to the left foot but sensation is intact. Dressing/Incision - Moderate serosanguinous drainage Motor Function - intact, moving foot and toes well on exam.  Abdomen soft to palpation.  Past Medical History:  Diagnosis Date   Aneurysm ([place])    Anxiety    Arthritis    Cancer (HCC)    CHF (congestive heart  failure) (HCC)    COPD (chronic obstructive pulmonary disease) (HCC)    Hyperlipidemia    Hypertension    Stroke ([place])    Thyroid disease    Assessment/Plan: 3 Days Post-Op Procedure(s) (LRB): ARTHROPLASTY BIPOLAR HIP (HEMIARTHROPLASTY) (Left) Principal Problem:   Hip fracture (HCC) Active Problems:   Hypothyroidism   Closed rib fracture   Acute blood loss anemia   AF (paroxysmal atrial fibrillation) (HCC)   CVA, old, hemiparesis ([place])   Wound of skin  Estimated body mass index is 17.65 kg/m as calculated from the following:   Height as of this encounter: 4\' 9"  (1.448 m).   Weight as of this encounter: 37 kg. Advance diet Up with therapy   Labs reviewed this AM. Hg 8.3 this AM. Up with therapy, patient with history of left hemiparesis. Will need SNF upon discharge. Begin working on BM.  Upon discharge, follow-up with [facility] orthopaedics in 14 days for staple removal and x-rays of the left hip. Continue warfarin upon discharge for DVT prevention.  DVT Prophylaxis - Coumadin and Foot Pumps Weight-Bearing as tolerated to left leg  J. Cameron Proud, PA-C [facility] 11/14/2019, 8:02 AM

## 2019-11-14 NOTE — Consult Note (Signed)
[facility] for Warfarin Indication: CVA  Allergies  Allergen Reactions  . Aspirin Other (See Comments)    ulcer  . Sulfa Antibiotics Other (See Comments)    unknown    Patient Measurements: Height: 4\' 9"  (144.8 cm) Weight: 37 kg (81 lb 9.1 oz) IBW/kg (Calculated) : 38.6  Vital Signs: Temp: 99.3 F (37.4 C) (10/13 0755) Temp Source: Oral (10/13 0755) BP: 131/63 (10/13 0755) Pulse Rate: 90 (10/13 0755)  Labs: Recent Labs    11/12/19 0331 11/12/19 0331 11/13/19 0355 11/14/19 0502  HGB 9.1*   < > 8.3* 8.3*  HCT 26.9*  --  26.9* 26.9*  PLT 298  --  282 271  LABPROT 14.7  --  23.4* 20.6*  INR 1.2  --  2.2* 1.8*  CREATININE 0.69  --   --   --    < > = values in this interval not displayed.    Estimated Creatinine Clearance: 38.2 mL/min (by C-G formula based on SCr of 0.69 mg/dL).   Medical History: Past Medical History:  Diagnosis Date  . Aneurysm ([place])   . Anxiety   . Arthritis   . Cancer ([place])   . CHF (congestive heart failure) ([place])   . COPD (chronic obstructive pulmonary disease) ([place])   . Hyperlipidemia   . Hypertension   . Stroke ([place])   . Thyroid disease     Medications:  PTA warfarin 1mg  daily  Assessment: 69yo female presented with L hip pain. PMH COPD, anemia, CHF, HLD, HTN, hypothyroidism, and left hemiparesis following a stroke (on warfarin). Patient's last dose of warfarin was her home dose of 1mg  on 10/8. Patient received IV vitamin K 1mg  x2 doses in anticipation for procedure (arthroplasty bipolar hip) and warfarin was held.   Pharmacy has been consulted for warfarin dosing and monitoring.   HGB stable  Platelets stable @ 271  Date INR  Dose 10/9 1.7>1.3 Held 10/10 1.2  2mg  10/11  1.2  2mg  10/12  2.2  1mg  10/13   1.8     Goal of Therapy:  INR 2-3 Monitor platelets by anticoagulation protocol: Yes   Plan:  INR 1.8 is slightly sub-therapeutic.   Will give 1.5 mg dose tonight (50% greater  than home dose).    Monitor daily INR and [facility], PharmD, BCPS Clinical Pharmacist 11/14/2019 7:56 AM

## 2019-11-14 NOTE — Progress Notes (Signed)
PT Cancellation Note  Patient Details Name: Teresa Holmes MRN: [id number] DOB: 1950-08-04   Cancelled Treatment:     PT attempt. 2nd attempt today. Pt refused. She states," I'm going home today.I'm not going to rehab." discussed safety concerns about DC home versus SNF. She still was unwilling. She plans to go home this date via EMS. Acute PT will continue to follow per POC. If pt is to DC home today, will need HHPT.   Willette Pa 11/14/2019, 1:19 PM

## 2019-11-14 NOTE — Discharge Instructions (Signed)
Hip Fracture Treated With ORIF, Care After This sheet gives you information about how to care for yourself after your procedure. Your health care provider may also give you more specific instructions. If you have problems or questions, contact your health care provider. What can I expect after the procedure? After the procedure, it is common to have:  Pain. You will be given medicines to treat this.  Swelling.  Difficulty walking.  Some redness or bruising around the incision.  A small amount of fluid or blood from the incision. Follow these instructions at home: Medicines  Take over-the-counter and prescription medicines only as told by your health care provider.  You may be given a blood thinner to take for up to six weeks. This will help reduce the risk of developing a blood clot. It is important to use this medicine exactly as directed.  You may be given calcium and vitamin D supplements to strengthen your bones.  If you are taking prescription pain medicine, take actions to prevent or treat constipation. Your health care provider may recommend that you: ? Drink enough fluid to keep your urine pale yellow. ? Eat foods that are high in fiber, such as fresh fruits and vegetables, whole grains, and beans. ? Limit foods that are high in fat and processed sugars, such as fried or sweet foods. ? Take an over-the-counter or prescription medicine for constipation. Bathing  Do not take baths, swim, or use a hot tub until your health care provider approves. Ask your health care provider if you can take showers. You may only be allowed to take sponge baths.  Keep the bandage (dressing) dry until your health care provider says it can be removed. Incision care   Follow instructions from your health care provider about how to take care of your incision. Make sure you: ? Wash your hands with soap and water before you change your dressing. If soap and water are not available, use hand  sanitizer. ? Change your dressing as told by your health care provider. ? Leave stitches (sutures), skin glue, or adhesive strips in place. These skin closures may need to stay in place for 2 weeks or longer. If adhesive strip edges start to loosen and curl up, you may trim the loose edges. Do not remove adhesive strips completely unless your health care provider tells you to do that.  Check your incision area every day for signs of infection. Check for: ? More redness, swelling, or pain. ? More fluid or blood. ? Warmth. ? Pus or a bad smell. Managing pain, stiffness, and swelling   If directed, put ice on the affected area to prevent pain and swelling. ? Put ice in a plastic bag. ? Place a towel between your skin and the bag. ? Leave the ice on for 20 minutes, 2-3 times a day.  Move your toes often to avoid stiffness and to lessen swelling.  Raise (elevate) your leg above the level of your heart while you are sitting or lying down. To do this, try putting a few pillows under your leg. Activity   Return to your normal activities as told by your health care provider. Ask your health care provider what activities are safe for you.  Do exercises as told by your health care provider or physical therapist. This will help make your hip stronger and help you recover more quickly.  Do not use your injured limb to support (bear) your body weight until your health care provider says that   you can. Follow weight-bearing restrictions as told. Use crutches or other devices to help you move around (assistive devices) as directed.  You may feel most comfortable using a raised surface when sitting on the toilet or in a chair.  Consider using a toilet seat riser over the toilet for comfort. Driving  Do not drive or use heavy machinery while taking prescription pain medicine.  Ask your health care provider when it is safe for you to drive. General instructions  Wear compression stockings as told  by your health care provider. These stockings help to prevent blood clots and reduce swelling in your legs.  Do not use any products that contain nicotine or tobacco, such as cigarettes and e-cigarettes. These can delay bone healing. If you need help quitting, ask your health care provider.  Keep all follow-up visits as told by your health care provider. This is important. This may include visits for: ? Physical therapy. ? Screening for osteoporosis. Osteoporosis is thinning and loss of density in your bones. Contact a health care provider if you:  Have a fever.  Have pain that is not helped with medicine.  Have more redness, swelling, or pain at your incision area.  Have more fluid or blood coming from your incision or leaking through your dressing.  Notice that your incision feels warm to the touch.  Have pus or a bad smell coming from your incision area. Get help right away if you:  Notice that the edges of your incision have come apart after the sutures or staples have been removed.  Have pain, warmth, or tenderness in the back of your lower leg (calf).  Have tingling or numbness in your leg.  Have a pale and cold leg.  Have trouble breathing.  Have chest pain. Summary  After the procedure, it is common to have some pain and swelling.  Take pain medicines as directed by your health care provider. Icing may also help with pain control.  Contact your health care provider if you have signs of infection, severe pain, or more fluid or blood coming from your incision. This information is not intended to replace advice given to you by your health care provider. Make sure you discuss any questions you have with your health care provider. Document Revised: 10/08/2017 Document Reviewed: 02/28/2017 Elsevier Patient Education  2020 [employer]  

## 2019-11-14 NOTE — Discharge Summary (Signed)
Discharge Summary  Teresa Holmes ZOX:[id number] DOB: 12/21/1950  PCP: Ronnell Freshwater, NP  Admit date: 11/10/2019 Discharge date: 11/14/2019  Time spent: 35 minutes   Recommendations for Outpatient Follow-up:  1. Follow up with orthopedic surgery in 14 days for staples removal 2. Follow up with your PCP 3. Take your medications as prescribed 4. Continue PT OT with assistance and fall precautions  Discharge Diagnoses:  Active Hospital Problems   Diagnosis Date Noted  . Hip fracture ([place]) 11/10/2019  . Closed rib fracture 11/10/2019  . Acute blood loss anemia 11/10/2019  . AF (paroxysmal atrial fibrillation) ([facility]) 11/10/2019  . CVA, old, hemiparesis ([place]) 11/10/2019  . Wound of skin 11/10/2019  . Hypothyroidism 03/14/2017    Resolved Hospital Problems  No resolved problems to display.    Discharge Condition: Stable  Diet recommendation: Resume previous diet  Vitals:   11/13/19 2249 11/14/19 0755  BP: 138/67 131/63  Pulse: 87 90  Resp: 17 17  Temp: 99.6 F (37.6 C) 99.3 F (37.4 C)  SpO2: 100% 100%    History of present illness:  Brief/Interim Summary: Patient is 69 year old female with history of nonhemorrhagic CVA with residual left hemiplegia, COPD, coronaryarterydisease, paroxysmal A. fib on Coumadin, hypothyroidism who presents from home with complaints of fall several days ago and decided to present to the emergency department after persistent left hip pain. She normally ambulates with a cane, lives alone and has a daytime caretaker. Patient reported significant swelling of the left arm since the fall. On presentation she was hypotensive, anemic with hemoglobin of 5.8, baseline of 7. Chest x-ray showed several left rib fractures mainly in the fourth and sixth rib. Left hip x-ray showed superiorly displaced subcapital fracture of the left femoral neck.UnderwentLefthip unipolar hemiarthroplastyon 11/11/2019.PT/OT consulted with plan to discharge to  skilled nursing facility. She is medically stable for discharge today.  11/14/19:  No new complaints.  Significant other in the room with her.  Hospital Course:  Principal Problem:   Hip fracture ([place]) Active Problems:   Hypothyroidism   Closed rib fracture   Acute blood loss anemia   AF (paroxysmal atrial fibrillation) ([facility])   CVA, old, hemiparesis ([place])   Wound of skin  Fall/left femur fracture: Left hip x-ray showed superiorly displaced subcapital fracture of the left femoral neck. Orthopedics consulted and she underwent lefthip unipolar hemiarthroplastywith repair of left greater trochanter fracture. On coumadin for Dvt ppx PT/OT recommended a skilled nursing facility on discharge.  She needs to follow-up with orthopedics in 2 weeks.  Left-sided rib fractures:Continue pain management, supportive care. Incentive spirometry.  Left upper extremity skin tear/swelling:Ultrasound showed hematoma, no fractures or DVT. Continue supportive care.   Acute blood loss normocytic anemia: Presented with hemoglobin of 5.8, most likely associated with blood loss from hip fracture. She was given a unit of PRBC . Hemoglobin in the range of 8 today.  Check CBC in a week  Paroxysmal A. fib: On Coumadin for anticoagulation. Coumadin resumed.  INR 1.8. On metoprolol for rate control.  History of nonhemorrhagic CVA: Has left-sided residual hemiplegia. Ambulates with a cane. PT/OT consulted and recommended SNF.  Hypothyroidism: Continue Synthyroid    Procedures:  Left hip repair  POD#3  Consultations:  Orthopedic surgery  Discharge Exam: BP 131/63 (BP Location: Right Leg)   Pulse 90   Temp 99.3 F (37.4 C) (Oral)   Resp 17   Ht 4\' 9"  (1.448 m)   Wt 37 kg   SpO2 100%   BMI 17.65  kg/m  . General: 69 y.o. year-old female well developed well nourished in no acute distress.  Alert and interactive. . Cardiovascular: Regular rate and rhythm with no rubs or  gallops.  No thyromegaly or JVD noted.   Marland Kitchen Respiratory: Clear to auscultation with no wheezes or rales. Good inspiratory effort. . Abdomen: Soft nontender nondistended with normal bowel sounds x4 quadrants. . Musculoskeletal: No lower extremity edema. 2/4 pulses in all 4 extremities. Marland Kitchen Psychiatry: Mood is appropriate for condition and setting  Discharge Instructions You were cared for by a hospitalist during your hospital stay. If you have any questions about your discharge medications or the care you received while you were in the hospital after you are discharged, you can call the unit and asked to speak with the hospitalist on call if the hospitalist that took care of you is not available. Once you are discharged, your primary care physician will handle any further medical issues. Please note that NO REFILLS for any discharge medications will be authorized once you are discharged, as it is imperative that you return to your primary care physician (or establish a relationship with a primary care physician if you do not have one) for your aftercare needs so that they can reassess your need for medications and monitor your lab values.  Discharge Instructions    Diet - low sodium heart healthy   Complete by: As directed    Discharge instructions   Complete by: As directed    1)Please follow-up with your PCP in a week.  Do a CBC test during the follow-up 2) follow-up with orthopedics in 2 weeks.  Name and number of the provider has been attached. 3)Follow up with home health services.   Increase activity slowly   Complete by: As directed    No wound care   Complete by: As directed      Allergies as of 11/14/2019      Reactions   Aspirin Other (See Comments)   ulcer   Sulfa Antibiotics Other (See Comments)   unknown      Medication List    TAKE these medications   atorvastatin 40 MG tablet Commonly known as: LIPITOR Take 1 tablet (40 mg total) by mouth daily.   fenofibrate 145 MG  tablet Commonly known as: TRICOR Take 1 tablet (145 mg total) by mouth daily.   furosemide 20 MG tablet Commonly known as: LASIX 20 MG FOR 3 DAYS THEN ONE EVERY OTHER DAY   HYDROcodone-acetaminophen 5-325 MG tablet Commonly known as: NORCO/VICODIN Take 1-2 tablets by mouth every 6 (six) hours as needed for moderate pain.   levothyroxine 50 MCG tablet Commonly known as: SYNTHROID Take 1 tablet (50 mcg total) by mouth daily before breakfast.   metoprolol tartrate 25 MG tablet Commonly known as: LOPRESSOR Take 0.5 tablets (12.5 mg total) by mouth 2 (two) times daily.   polyethylene glycol 17 g packet Commonly known as: MIRALAX / GLYCOLAX Take 17 g by mouth daily.   warfarin 1 MG tablet Commonly known as: COUMADIN Take 1 mg by mouth daily.      Allergies  Allergen Reactions  . Aspirin Other (See Comments)    ulcer  . Sulfa Antibiotics Other (See Comments)    unknown    Follow-up Information    Ronnell Freshwater, NP. Schedule an appointment as soon as possible for a visit on 11/29/2019.   Specialty: Family Medicine Why: @ 3:30 pm Contact information: [address] [place]  [address] [phone]  Poggi, Marshall Cork, MD. Schedule an appointment as soon as possible for a visit on 11/26/2019.   Specialty: Orthopedic Surgery Why: @ 9:45 am Contact information: [address] [place] [address] [phone]                The results of significant diagnostics from this hospitalization (including imaging, microbiology, ancillary and laboratory) are listed below for reference.    Significant Diagnostic Studies: DG Chest 1 View  Result Date: 11/10/2019 CLINICAL DATA:  Fall 2 days ago.  Left arm wound. EXAM: CHEST  1 VIEW COMPARISON:  Chest radiographs 09/28/2018 and 01/18/2006. Abdominal radiographs 09/30/2018 FINDINGS: 1640 hours. Mild patient rotation to the left. The heart size and mediastinal contours are stable status  post median sternotomy. The lungs appear clear. There is no pleural effusion or pneumothorax. There are several left rib fractures laterally which may be subacute, notably in the 4th and 6th ribs. Fractures of the 7th and 8th ribs may be acute. IMPRESSION: Several left-sided rib fractures, at least some likely subacute. No pleural effusion or pneumothorax. Electronically Signed   By: Richardean Sale M.D.   On: 11/10/2019 17:40   DG Pelvis 1-2 Views  Result Date: 11/10/2019 CLINICAL DATA:  69 year old female with fall. EXAM: PELVIS - 1-2 VIEW COMPARISON:  Pelvic radiograph dated 09/28/2018. FINDINGS: There is a displaced fracture of the left femoral neck with proximal migration of the femoral shaft. No dislocation. There is advanced osteopenia. Moderate stool noted in the rectal vault. The soft tissues are unremarkable. IMPRESSION: Displaced fracture of the left femoral neck. Electronically Signed   By: Anner Crete M.D.   On: 11/10/2019 17:32   DG Forearm Left  Result Date: 11/10/2019 CLINICAL DATA:  69 year old female with fall and trauma to the left upper extremity. EXAM: LEFT FOREARM - 2 VIEW COMPARISON:  None. FINDINGS: Evaluation is very limited due to advanced osteopenia. No definite acute fracture or dislocation. Old appearing fracture deformity of the distal radius. Clinical correlation is recommended. The bones are osteopenic. There is diffuse subcutaneous soft tissue edema. No radiopaque foreign object or soft tissue gas. IMPRESSION: 1. No definite acute fracture or dislocation. Old appearing fracture of the distal radius. 2. Diffuse subcutaneous soft tissue edema. Electronically Signed   By: Anner Crete M.D.   On: 11/10/2019 17:30   CT Head Wo Contrast  Result Date: 11/10/2019 CLINICAL DATA:  Fall 2 days ago EXAM: CT HEAD WITHOUT CONTRAST TECHNIQUE: Contiguous axial images were obtained from the base of the skull through the vertex without intravenous contrast. COMPARISON:  August 14, 2019 FINDINGS: Brain: No evidence of acute infarction, hemorrhage, hydrocephalus, extra-axial collection or mass lesion/mass effect. Sequela of large RIGHT MCA infarction with encephalomalacia and cortical gliosis involving the RIGHT temporal and frontoparietal lobes. Periventricular white matter hypodensities consistent with sequela of chronic microvascular ischemic disease. Remote RIGHT cerebellar infarction. Vascular: No hyperdense vessel or unexpected calcification. Skull: Normal. Negative for fracture or focal lesion. Sinuses/Orbits: No acute finding. Other: None. IMPRESSION: 1.  No acute intracranial abnormality. Electronically Signed   By: Valentino Saxon MD   On: 11/10/2019 17:24   US Venous Img Upper Uni Left  Result Date: 11/10/2019 CLINICAL DATA:  Fall, redness and swelling of the left upper extremity EXAM: LEFT UPPER EXTREMITY VENOUS DOPPLER ULTRASOUND TECHNIQUE: Gray-scale sonography with graded compression, as well as color Doppler and duplex ultrasound were performed to evaluate the upper extremity deep venous system from the level of the subclavian vein  and including the jugular, axillary, basilic, radial, ulnar and upper cephalic vein. Spectral Doppler was utilized to evaluate flow at rest and with distal augmentation maneuvers. COMPARISON:  None. FINDINGS: Contralateral Subclavian Vein: Respiratory phasicity is normal and symmetric with the symptomatic side. No evidence of thrombus. Normal compressibility. Internal Jugular Vein: No evidence of thrombus. Normal compressibility, respiratory phasicity and response to augmentation. Subclavian Vein: No evidence of thrombus. Normal compressibility, respiratory phasicity and response to augmentation. Axillary Vein: No evidence of thrombus. Normal compressibility, respiratory phasicity and response to augmentation. Cephalic Vein: No evidence of thrombus. Normal compressibility, respiratory phasicity and response to augmentation. Basilic Vein: No  evidence of thrombus. Normal compressibility, respiratory phasicity and response to augmentation. Brachial Veins: No evidence of thrombus. Normal compressibility, respiratory phasicity and response to augmentation. Radial Veins: No evidence of thrombus. Normal compressibility, respiratory phasicity and response to augmentation. Ulnar Veins: No evidence of thrombus. Normal compressibility, respiratory phasicity and response to augmentation. Venous Reflux:  None visualized. Other Findings: There is a hypoechoic collection seen in the region of the left axilla at the level of though separate from the axillary vein which could reflect a hematoma or seroma though is overall indeterminate on these ultrasound images. Additionally, there is diffuse soft tissue edema of the left upper extremity most pronounced at the level of the upper arm. IMPRESSION: 1. No evidence of DVT within the left upper extremity. 2. Hypoechoic collection in the region of the left axilla measuring 2.9 by 1.0 cm in transverse dimensions (image 12/37) could reflect a hematoma in the setting of trauma though is indeterminate on these ultrasound images. Could consider further evaluation with cross-sectional imaging. 3. Diffuse soft tissue edema of the left upper extremity, most pronounced at the level of the upper arm. Electronically Signed   By: Lovena Le M.D.   On: 11/10/2019 17:55   DG HIP UNILAT W OR W/O PELVIS 2-3 VIEWS LEFT  Result Date: 11/11/2019 CLINICAL DATA:  Postop, left hip arthroplasty EXAM: DG HIP (WITH OR WITHOUT PELVIS) 2-3V LEFT COMPARISON:  Radiograph 11/10/2019 FINDINGS: Patient appears to be post left hip hemiarthroplasty for the repair of the previously seen displaced femoral neck fracture. Articulating femoral component appears normally seated within the acetabulum. Few adjacent postsurgical ossifications are present. Associated postsurgical soft tissue changes including soft tissue gas, intra-articular gas and overlying  skin staples are present. Additional surgical clips noted in the medial thighs, likely related to prior vascular surgeries. Remaining bones of the pelvis are intact and congruent. The osseous structures appear diffusely demineralized which may limit detection of small or nondisplaced fractures. IMPRESSION: Status post left hip hemiarthroplasty without evidence of acute hardware complication. Electronically Signed   By: Lovena Le M.D.   On: 11/11/2019 19:35   DG Femur Min 2 Views Left  Result Date: 11/10/2019 CLINICAL DATA:  Fall 2 days ago.  Hip deformity. EXAM: LEFT FEMUR 2 VIEWS COMPARISON:  Pelvic radiographs same date. One view abdomen 07/15/2019. FINDINGS: The bones are diffusely demineralized. There is a superiorly displaced subcapital fracture of the left femoral neck. The fracture margins are somewhat ill-defined, and this could be subacute in age. The distal femur is intact. The visualized bony pelvis is intact. Mild degenerative changes are present at the left hip and knee. There are vascular clips in the left groin and medial thigh. IMPRESSION: Superiorly displaced subcapital fracture of the left femoral neck, possibly subacute in age. Electronically Signed   By: Richardean Sale M.D.   On: 11/10/2019 17:36  Microbiology: Recent Results (from the past 240 hour(s))  Respiratory Panel by RT PCR (Flu A&B, Covid) - Nasopharyngeal Swab     Status: None   Collection Time: 11/10/19  4:44 PM   Specimen: Nasopharyngeal Swab  Result Value Ref Range Status   SARS Coronavirus 2 by RT PCR NEGATIVE NEGATIVE Final    Comment: (NOTE) SARS-CoV-2 target nucleic acids are NOT DETECTED.  The SARS-CoV-2 RNA is generally detectable in upper respiratoy specimens during the acute phase of infection. The lowest concentration of SARS-CoV-2 viral copies this assay can detect is 131 copies/mL. A negative result does not preclude SARS-Cov-2 infection and should not be used as the sole basis for treatment  or other patient management decisions. A negative result may occur with  improper specimen collection/handling, submission of specimen other than nasopharyngeal swab, presence of viral mutation(s) within the areas targeted by this assay, and inadequate number of viral copies (<131 copies/mL). A negative result must be combined with clinical observations, patient history, and epidemiological information. The expected result is Negative.  Fact Sheet for Patients:  PinkCheek.be  Fact Sheet for Healthcare Providers:  GravelBags.it  This test is no t yet approved or cleared by the [place] FDA and  has been authorized for detection and/or diagnosis of SARS-CoV-2 by FDA under an Emergency Use Authorization (EUA). This EUA will remain  in effect (meaning this test can be used) for the duration of the COVID-19 declaration under Section 564(b)(1) of the Act, 21 U.S.C. section 360bbb-3(b)(1), unless the authorization is terminated or revoked sooner.     Influenza A by PCR NEGATIVE NEGATIVE Final   Influenza B by PCR NEGATIVE NEGATIVE Final    Comment: (NOTE) The Xpert Xpress SARS-CoV-2/FLU/RSV assay is intended as an aid in  the diagnosis of influenza from Nasopharyngeal swab specimens and  should not be used as a sole basis for treatment. Nasal washings and  aspirates are unacceptable for Xpert Xpress SARS-CoV-2/FLU/RSV  testing.  Fact Sheet for Patients: PinkCheek.be  Fact Sheet for Healthcare Providers: GravelBags.it  This test is not yet approved or cleared by the [place] FDA and  has been authorized for detection and/or diagnosis of SARS-CoV-2 by  FDA under an Emergency Use Authorization (EUA). This EUA will remain  in effect (meaning this test can be used) for the duration of the  Covid-19 declaration under Section 564(b)(1) of the Act, 21  U.S.C.  section 360bbb-3(b)(1), unless the authorization is  terminated or revoked. Performed at [facility], [address] [place], [place] [address]      Labs: Basic Metabolic Panel: Recent Labs  Lab 11/10/19 1603 11/11/19 0500 11/12/19 0331  NA 133* 132* 132*  K 3.3* 3.5 3.8  CL 98 98 102  CO2 25 26 22   GLUCOSE 115* 94 88  BUN 24* 19 18  CREATININE 0.64 0.47 0.69  CALCIUM 7.6* 7.4* 7.7*   Liver Function Tests: Recent Labs  Lab 11/10/19 1603  AST 40  ALT 19  ALKPHOS 160*  BILITOT 1.4*  PROT 5.4*  ALBUMIN 2.1*   No results for input(s): LIPASE, AMYLASE in the last 168 hours. No results for input(s): AMMONIA in the last 168 hours. CBC: Recent Labs  Lab 11/10/19 1603 11/10/19 1603 11/10/19 2245 11/11/19 0500 11/12/19 0331 11/13/19 0355 11/14/19 0502  WBC 8.4  --   --  7.5 10.4 9.0 8.6  NEUTROABS  --   --   --   --  8.5* 7.3 6.6  HGB 5.8*   < >  7.5* 7.2* 9.1* 8.3* 8.3*  HCT 19.8*   < > 23.7* 22.9* 26.9* 26.9* 26.9*  MCV 102.1*  --   --  97.0 92.4 97.5 97.5  PLT 544*  --   --  415* 298 282 271   < > = values in this interval not displayed.   Cardiac Enzymes: No results for input(s): CKTOTAL, CKMB, CKMBINDEX, TROPONINI in the last 168 hours. BNP: BNP (last 3 results) No results for input(s): BNP in the last 8760 hours.  ProBNP (last 3 results) No results for input(s): PROBNP in the last 8760 hours.  CBG: No results for input(s): GLUCAP in the last 168 hours.     Signed:  Kayleen Memos, MD [facility] 11/14/2019, 10:32 AM

## 2019-11-14 NOTE — Progress Notes (Signed)
Physical Therapy Treatment Patient Details Name: Teresa Holmes MRN: [id number] DOB: 1950/04/09 Today's Date: 11/14/2019    History of Present Illness Teresa Holmes is a 69 y/o female who was admitted for mechanical fall which resulted in subcapital fracture of L femoral neck. Pt underwent L posterior hemiarthroplasty on 10/10. PMH includes CVA w/ L resuidual hemiplegia, COPD, CAD s/p MIs, paroxysmal A-fib on Coumadin, hypothyroidism, aneurism, anxiety, arthritis, cancer, CHF, and HLD.    PT Comments    Author returned to room to discuss that pt's caregiver wants her to go to rehab prior to returning home. Pt is agreeable now and with encouragement did agree to trial PT session. Very limited by pain and fear of falling. Pt is anxious and very fearful of falling even in static sitting. She endorses 8/10 pain LLE. Required constant vcs for relaxation, sequencing, and technique improvements. Only tolerated sitting EOB x a couple minutes piror to demanding to return to supine. She did perform several AAROM LLE and AROM on RLE. Pt will benefit form SNF at [place] prior to returning home. Continued skilled PT will address deficits and improve independence. RN aware of pt's abilities. Call bell was in reach, with bed alarm set, and bed rails in proper position at conclusion of session.     Follow Up Recommendations  SNF     Equipment Recommendations  Other (comment) (defer to next level of care)    Recommendations for Other Services       Precautions / Restrictions Precautions Precautions: Fall;Posterior Hip Precaution Booklet Issued: No Restrictions Weight Bearing Restrictions: Yes LLE Weight Bearing: Weight bearing as tolerated    Mobility  Bed Mobility Overal bed mobility: Needs Assistance Bed Mobility: Supine to Sit;Sit to Supine Rolling: Max assist   Supine to sit: Max assist Sit to supine: Max assist   General bed mobility comments: Pt required max assist to exit/re-enter bed with  increased time and constant vcs for technique and sequencing. she only tolerated sitting ~ 2 - 2 1/2 minutes prior to demanding to return to supine. limited by pain and fear.  Transfers        General transfer comment: pt was unwilling to try to stand this date. did sit EOB for very short period and did perform very limited ther ex in bed.     Balance Overall balance assessment: Needs assistance Sitting-balance support: Feet unsupported;Single extremity supported Sitting balance-Leahy Scale: Poor Sitting balance - Comments: pt only able to sit ~ 2 minutes EOB with constant assistance. Posterior lean throughout with constant vcs for safety. pt impulsively attempts to return to supine          Cognition Arousal/Alertness: Awake/alert Behavior During Therapy: [facility] for tasks assessed/performed;Anxious Overall Cognitive Status: Within Functional Limits for tasks assessed      General Comments: Pt A & O x 4.         General Comments General comments (skin integrity, edema, etc.): issued PRAFO boot for LLE      Pertinent Vitals/Pain Pain Assessment: 0-10 Pain Score: 8  Faces Pain Scale: Hurts whole lot Pain Location: L hip and back Pain Descriptors / Indicators: Aching;Discomfort;Grimacing;Sore Pain Intervention(s): Limited activity within patient's tolerance;Monitored during session;Premedicated before session;Repositioned           PT Goals (current goals can now be found in the care plan section) Acute Rehab PT Goals Patient Stated Goal: to go home after rehab Progress towards PT goals: Not progressing toward goals - comment    Frequency  7X/week      PT Plan Current plan remains appropriate       AM-PAC PT "6 Clicks" Mobility   Outcome Measure  Help needed turning from your back to your side while in a flat bed without using bedrails?: A Lot Help needed moving from lying on your back to sitting on the side of a flat bed without using bedrails?: A Lot Help  needed moving to and from a bed to a chair (including a wheelchair)?: Total Help needed standing up from a chair using your arms (e.g., wheelchair or bedside chair)?: Total Help needed to walk in hospital room?: Total Help needed climbing 3-5 steps with a railing? : Total 6 Click Score: 8    End of Session Equipment Utilized During Treatment: Gait belt Activity Tolerance: Patient limited by pain;Patient limited by fatigue;Other (comment) (and anxiety with all mobility) Patient left: in bed;with call bell/phone within reach Nurse Communication: Mobility status PT Visit Diagnosis: Unsteadiness on feet (R26.81);Other abnormalities of gait and mobility (R26.89);Muscle weakness (generalized) (M62.81);History of falling (Z91.81);Hemiplegia and hemiparesis;Pain Hemiplegia - Right/Left: Left Hemiplegia - dominant/non-dominant: Non-dominant Hemiplegia - caused by: Cerebral infarction Pain - Right/Left: Left Pain - part of body: Hip     Time: 1497-0263 PT Time Calculation (min) (ACUTE ONLY): 15 min  Charges:  $Therapeutic Activity: 8-22 mins                     Julaine Fusi PTA 11/14/19, 3:33 PM

## 2019-11-15 DIAGNOSIS — E440 Moderate protein-calorie malnutrition: Secondary | ICD-10-CM | POA: Diagnosis not present

## 2019-11-15 DIAGNOSIS — Z4789 Encounter for other orthopedic aftercare: Secondary | ICD-10-CM | POA: Diagnosis not present

## 2019-11-15 DIAGNOSIS — I4892 Unspecified atrial flutter: Secondary | ICD-10-CM | POA: Diagnosis not present

## 2019-11-15 DIAGNOSIS — M80052D Age-related osteoporosis with current pathological fracture, left femur, subsequent encounter for fracture with routine healing: Secondary | ICD-10-CM | POA: Diagnosis not present

## 2019-11-15 DIAGNOSIS — R2681 Unsteadiness on feet: Secondary | ICD-10-CM | POA: Diagnosis not present

## 2019-11-15 DIAGNOSIS — M6281 Muscle weakness (generalized): Secondary | ICD-10-CM | POA: Diagnosis not present

## 2019-11-15 DIAGNOSIS — I509 Heart failure, unspecified: Secondary | ICD-10-CM | POA: Diagnosis not present

## 2019-11-15 DIAGNOSIS — J302 Other seasonal allergic rhinitis: Secondary | ICD-10-CM | POA: Diagnosis not present

## 2019-11-15 DIAGNOSIS — Z23 Encounter for immunization: Secondary | ICD-10-CM | POA: Diagnosis not present

## 2019-11-15 DIAGNOSIS — K5900 Constipation, unspecified: Secondary | ICD-10-CM | POA: Diagnosis not present

## 2019-11-15 DIAGNOSIS — I2510 Atherosclerotic heart disease of native coronary artery without angina pectoris: Secondary | ICD-10-CM | POA: Diagnosis not present

## 2019-11-15 DIAGNOSIS — I69354 Hemiplegia and hemiparesis following cerebral infarction affecting left non-dominant side: Secondary | ICD-10-CM | POA: Diagnosis not present

## 2019-11-15 DIAGNOSIS — I480 Paroxysmal atrial fibrillation: Secondary | ICD-10-CM | POA: Diagnosis not present

## 2019-11-15 DIAGNOSIS — Z96642 Presence of left artificial hip joint: Secondary | ICD-10-CM | POA: Diagnosis not present

## 2019-11-15 DIAGNOSIS — J449 Chronic obstructive pulmonary disease, unspecified: Secondary | ICD-10-CM | POA: Diagnosis not present

## 2019-11-15 DIAGNOSIS — J440 Chronic obstructive pulmonary disease with acute lower respiratory infection: Secondary | ICD-10-CM | POA: Diagnosis not present

## 2019-11-15 DIAGNOSIS — D649 Anemia, unspecified: Secondary | ICD-10-CM | POA: Diagnosis not present

## 2019-11-15 DIAGNOSIS — S72002A Fracture of unspecified part of neck of left femur, initial encounter for closed fracture: Secondary | ICD-10-CM | POA: Diagnosis not present

## 2019-11-15 DIAGNOSIS — I4891 Unspecified atrial fibrillation: Secondary | ICD-10-CM | POA: Diagnosis not present

## 2019-11-15 DIAGNOSIS — S2242XD Multiple fractures of ribs, left side, subsequent encounter for fracture with routine healing: Secondary | ICD-10-CM | POA: Diagnosis not present

## 2019-11-15 DIAGNOSIS — R279 Unspecified lack of coordination: Secondary | ICD-10-CM | POA: Diagnosis not present

## 2019-11-15 DIAGNOSIS — J439 Emphysema, unspecified: Secondary | ICD-10-CM | POA: Diagnosis not present

## 2019-11-15 DIAGNOSIS — R5381 Other malaise: Secondary | ICD-10-CM | POA: Diagnosis not present

## 2019-11-15 DIAGNOSIS — R4182 Altered mental status, unspecified: Secondary | ICD-10-CM | POA: Diagnosis not present

## 2019-11-15 DIAGNOSIS — I10 Essential (primary) hypertension: Secondary | ICD-10-CM | POA: Diagnosis not present

## 2019-11-15 DIAGNOSIS — E039 Hypothyroidism, unspecified: Secondary | ICD-10-CM | POA: Diagnosis not present

## 2019-11-15 DIAGNOSIS — D62 Acute posthemorrhagic anemia: Secondary | ICD-10-CM | POA: Diagnosis not present

## 2019-11-15 DIAGNOSIS — S72002D Fracture of unspecified part of neck of left femur, subsequent encounter for closed fracture with routine healing: Secondary | ICD-10-CM | POA: Diagnosis not present

## 2019-11-15 DIAGNOSIS — R52 Pain, unspecified: Secondary | ICD-10-CM | POA: Diagnosis not present

## 2019-11-15 DIAGNOSIS — S2239XD Fracture of one rib, unspecified side, subsequent encounter for fracture with routine healing: Secondary | ICD-10-CM | POA: Diagnosis not present

## 2019-11-15 DIAGNOSIS — E785 Hyperlipidemia, unspecified: Secondary | ICD-10-CM | POA: Diagnosis not present

## 2019-11-15 DIAGNOSIS — E559 Vitamin D deficiency, unspecified: Secondary | ICD-10-CM | POA: Diagnosis not present

## 2019-11-15 LAB — CBC WITH DIFFERENTIAL/PLATELET
Abs Immature Granulocytes: 0.04 10*3/uL (ref 0.00–0.07)
Basophils Absolute: 0 10*3/uL (ref 0.0–0.1)
Basophils Relative: 0 %
Eosinophils Absolute: 0.1 10*3/uL (ref 0.0–0.5)
Eosinophils Relative: 1 %
HCT: 26.5 % — ABNORMAL LOW (ref 36.0–46.0)
Hemoglobin: 8.3 g/dL — ABNORMAL LOW (ref 12.0–15.0)
Immature Granulocytes: 1 %
Lymphocytes Relative: 24 %
Lymphs Abs: 1.7 10*3/uL (ref 0.7–4.0)
MCH: 30.6 pg (ref 26.0–34.0)
MCHC: 31.3 g/dL (ref 30.0–36.0)
MCV: 97.8 fL (ref 80.0–100.0)
Monocytes Absolute: 0.5 10*3/uL (ref 0.1–1.0)
Monocytes Relative: 7 %
Neutro Abs: 4.7 10*3/uL (ref 1.7–7.7)
Neutrophils Relative %: 67 %
Platelets: 291 10*3/uL (ref 150–400)
RBC: 2.71 MIL/uL — ABNORMAL LOW (ref 3.87–5.11)
RDW: 18.4 % — ABNORMAL HIGH (ref 11.5–15.5)
WBC: 7.1 10*3/uL (ref 4.0–10.5)
nRBC: 0 % (ref 0.0–0.2)

## 2019-11-15 LAB — PROTIME-INR
INR: 1.6 — ABNORMAL HIGH (ref 0.8–1.2)
Prothrombin Time: 18.3 seconds — ABNORMAL HIGH (ref 11.4–15.2)

## 2019-11-15 MED ORDER — WARFARIN SODIUM 2 MG PO TABS
2.0000 mg | ORAL_TABLET | Freq: Once | ORAL | Status: DC
  Filled 2019-11-15: qty 1

## 2019-11-15 NOTE — Discharge Summary (Signed)
Discharge Summary  Teresa Holmes UQJ:[id number] DOB: Jun 03, 1950  PCP: Ronnell Freshwater, NP  Admit date: 11/10/2019 Discharge date: 11/15/2019  Time spent: 35 minutes   Recommendations for Outpatient Follow-up:  1. Follow up with orthopedic surgery in 14 days for staples removal 2. Follow up with your PCP 3. Take your medications as prescribed 4. Continue PT OT with assistance and fall precautions  Discharge Diagnoses:  Active Hospital Problems   Diagnosis Date Noted  . Hip fracture ([place]) 11/10/2019  . Closed rib fracture 11/10/2019  . Acute blood loss anemia 11/10/2019  . AF (paroxysmal atrial fibrillation) ([place]) 11/10/2019  . CVA, old, hemiparesis ([place]) 11/10/2019  . Wound of skin 11/10/2019  . Hypothyroidism 03/14/2017    Resolved Hospital Problems  No resolved problems to display.    Discharge Condition: Stable  Diet recommendation: Resume previous diet  Vitals:   11/14/19 2315 11/15/19 0809  BP: 133/68 (!) 141/66  Pulse: 80 75  Resp: 18 17  Temp: 99.3 F (37.4 C) 98.1 F (36.7 C)  SpO2: 100% 98%    History of present illness:  Brief/Interim Summary: Patient is 69 year old female with history of nonhemorrhagic CVA with residual left hemiplegia, COPD, coronaryarterydisease, paroxysmal A. fib on Coumadin, hypothyroidism who presents from home with complaints of fall several days ago and decided to present to the emergency department after persistent left hip pain. She normally ambulates with a cane, lives alone and has a daytime caretaker. Patient reported significant swelling of the left arm since the fall. On presentation she was hypotensive, anemic with hemoglobin of 5.8, baseline of 7. Chest x-ray showed several left rib fractures mainly in the fourth and sixth rib. Left hip x-ray showed superiorly displaced subcapital fracture of the left femoral neck.UnderwentLefthip unipolar hemiarthroplastyon 11/11/2019.PT/OT consulted with plan to discharge  to skilled nursing facility. She is medically stable for discharge today.  11/15/19:  No new complaints.  Pain is controlled.  Hospital Course:  Principal Problem:   Hip fracture ([place]) Active Problems:   Hypothyroidism   Closed rib fracture   Acute blood loss anemia   AF (paroxysmal atrial fibrillation) ([facility])   CVA, old, hemiparesis ([place])   Wound of skin  Fall/left femur fracture: Left hip x-ray showed superiorly displaced subcapital fracture of the left femoral neck. Orthopedics consulted and she underwent lefthip unipolar hemiarthroplastywith repair of left greater trochanter fracture. On coumadin for Dvt ppx PT/OT recommended a skilled nursing facility on discharge.  She needs to follow-up with orthopedics in 2 weeks.  Left-sided rib fractures:Continue pain management, supportive care. Incentive spirometry.  Left upper extremity skin tear/swelling:Ultrasound showed hematoma, no fractures or DVT. Continue supportive care.   Acute blood loss normocytic anemia: Presented with hemoglobin of 5.8, most likely associated with blood loss from hip fracture. She was given a unit of PRBC . Hemoglobin in the range of 8 today.  Check CBC in a week  Paroxysmal A. fib: On Coumadin for anticoagulation. Coumadin resumed.  INR 1.8. On metoprolol for rate control.  History of nonhemorrhagic CVA: Has left-sided residual hemiplegia. Ambulates with a cane. PT/OT consulted and recommended SNF.  Hypothyroidism: Continue Synthyroid    Procedures:  Left hip repair  POD#3  Consultations:  Orthopedic surgery  Discharge Exam: BP (!) 141/66 (BP Location: Right Leg)   Pulse 75   Temp 98.1 F (36.7 C) (Axillary)   Resp 17   Ht 4\' 9"  (1.448 m)   Wt 37 kg   SpO2 98%   BMI 17.65 kg/m  .  General: 69 y.o. year-old female well developed well nourished in no acute distress.  Alert and interactive. . Cardiovascular: Regular rate and rhythm with no rubs or gallops.  No  thyromegaly or JVD noted.   Marland Kitchen Respiratory: Clear to auscultation with no wheezes or rales. Good inspiratory effort. . Abdomen: Soft nontender nondistended with normal bowel sounds x4 quadrants. . Musculoskeletal: No lower extremity edema. 2/4 pulses in all 4 extremities. Marland Kitchen Psychiatry: Mood is appropriate for condition and setting  Discharge Instructions You were cared for by a hospitalist during your hospital stay. If you have any questions about your discharge medications or the care you received while you were in the hospital after you are discharged, you can call the unit and asked to speak with the hospitalist on call if the hospitalist that took care of you is not available. Once you are discharged, your primary care physician will handle any further medical issues. Please note that NO REFILLS for any discharge medications will be authorized once you are discharged, as it is imperative that you return to your primary care physician (or establish a relationship with a primary care physician if you do not have one) for your aftercare needs so that they can reassess your need for medications and monitor your lab values.  Discharge Instructions    Diet - low sodium heart healthy   Complete by: As directed    Discharge instructions   Complete by: As directed    1)Please follow-up with your PCP in a week.  Do a CBC test during the follow-up 2) follow-up with orthopedics in 2 weeks.  Name and number of the provider has been attached. 3)Follow up with home health services.   Increase activity slowly   Complete by: As directed    No wound care   Complete by: As directed      Allergies as of 11/15/2019      Reactions   Aspirin Other (See Comments)   ulcer   Sulfa Antibiotics Other (See Comments)   unknown      Medication List    TAKE these medications   atorvastatin 40 MG tablet Commonly known as: LIPITOR Take 1 tablet (40 mg total) by mouth daily.   fenofibrate 145 MG  tablet Commonly known as: TRICOR Take 1 tablet (145 mg total) by mouth daily.   furosemide 20 MG tablet Commonly known as: LASIX 20 MG FOR 3 DAYS THEN ONE EVERY OTHER DAY   HYDROcodone-acetaminophen 5-325 MG tablet Commonly known as: NORCO/VICODIN Take 1-2 tablets by mouth every 6 (six) hours as needed for moderate pain.   levothyroxine 50 MCG tablet Commonly known as: SYNTHROID Take 1 tablet (50 mcg total) by mouth daily before breakfast.   metoprolol tartrate 25 MG tablet Commonly known as: LOPRESSOR Take 0.5 tablets (12.5 mg total) by mouth 2 (two) times daily.   polyethylene glycol 17 g packet Commonly known as: MIRALAX / GLYCOLAX Take 17 g by mouth daily.   warfarin 1 MG tablet Commonly known as: COUMADIN Take 1 mg by mouth daily.      Allergies  Allergen Reactions  . Aspirin Other (See Comments)    ulcer  . Sulfa Antibiotics Other (See Comments)    unknown    Contact information for follow-up providers    Ronnell Freshwater, NP. Schedule an appointment as soon as possible for a visit on 11/29/2019.   Specialty: Family Medicine Why: @ 3:30 pm Contact information: [address] [place] [address] [phone]  Poggi, Marshall Cork, MD. Daphane Shepherd on 11/26/2019.   Specialty: Orthopedic Surgery Why: @ 9:45 am Contact information: [place] [address] [phone]            Contact information for after-discharge care    Destination    [facility] Preferred SNF .   Service: Skilled Nursing Contact information: [address] [place] [phone]                   The results of significant diagnostics from this hospitalization (including imaging, microbiology, ancillary and laboratory) are listed below for reference.    Significant Diagnostic Studies: DG Chest 1 View  Result Date: 11/10/2019 CLINICAL DATA:  Fall 2 days ago.  Left arm wound. EXAM: CHEST   1 VIEW COMPARISON:  Chest radiographs 09/28/2018 and 01/18/2006. Abdominal radiographs 09/30/2018 FINDINGS: 1640 hours. Mild patient rotation to the left. The heart size and mediastinal contours are stable status post median sternotomy. The lungs appear clear. There is no pleural effusion or pneumothorax. There are several left rib fractures laterally which may be subacute, notably in the 4th and 6th ribs. Fractures of the 7th and 8th ribs may be acute. IMPRESSION: Several left-sided rib fractures, at least some likely subacute. No pleural effusion or pneumothorax. Electronically Signed   By: Richardean Sale M.D.   On: 11/10/2019 17:40   DG Pelvis 1-2 Views  Result Date: 11/10/2019 CLINICAL DATA:  69 year old female with fall. EXAM: PELVIS - 1-2 VIEW COMPARISON:  Pelvic radiograph dated 09/28/2018. FINDINGS: There is a displaced fracture of the left femoral neck with proximal migration of the femoral shaft. No dislocation. There is advanced osteopenia. Moderate stool noted in the rectal vault. The soft tissues are unremarkable. IMPRESSION: Displaced fracture of the left femoral neck. Electronically Signed   By: Anner Crete M.D.   On: 11/10/2019 17:32   DG Forearm Left  Result Date: 11/10/2019 CLINICAL DATA:  69 year old female with fall and trauma to the left upper extremity. EXAM: LEFT FOREARM - 2 VIEW COMPARISON:  None. FINDINGS: Evaluation is very limited due to advanced osteopenia. No definite acute fracture or dislocation. Old appearing fracture deformity of the distal radius. Clinical correlation is recommended. The bones are osteopenic. There is diffuse subcutaneous soft tissue edema. No radiopaque foreign object or soft tissue gas. IMPRESSION: 1. No definite acute fracture or dislocation. Old appearing fracture of the distal radius. 2. Diffuse subcutaneous soft tissue edema. Electronically Signed   By: Anner Crete M.D.   On: 11/10/2019 17:30   CT Head Wo Contrast  Result Date:  11/10/2019 CLINICAL DATA:  Fall 2 days ago EXAM: CT HEAD WITHOUT CONTRAST TECHNIQUE: Contiguous axial images were obtained from the base of the skull through the vertex without intravenous contrast. COMPARISON:  August 14, 2019 FINDINGS: Brain: No evidence of acute infarction, hemorrhage, hydrocephalus, extra-axial collection or mass lesion/mass effect. Sequela of large RIGHT MCA infarction with encephalomalacia and cortical gliosis involving the RIGHT temporal and frontoparietal lobes. Periventricular white matter hypodensities consistent with sequela of chronic microvascular ischemic disease. Remote RIGHT cerebellar infarction. Vascular: No hyperdense vessel or unexpected calcification. Skull: Normal. Negative for fracture or focal lesion. Sinuses/Orbits: No acute finding. Other: None. IMPRESSION: 1.  No acute intracranial abnormality. Electronically Signed   By: Valentino Saxon MD   On: 11/10/2019 17:24   US Venous Img Upper Uni Left  Result Date: 11/10/2019 CLINICAL DATA:  Fall, redness and swelling of the left upper  extremity EXAM: LEFT UPPER EXTREMITY VENOUS DOPPLER ULTRASOUND TECHNIQUE: Gray-scale sonography with graded compression, as well as color Doppler and duplex ultrasound were performed to evaluate the upper extremity deep venous system from the level of the subclavian vein and including the jugular, axillary, basilic, radial, ulnar and upper cephalic vein. Spectral Doppler was utilized to evaluate flow at rest and with distal augmentation maneuvers. COMPARISON:  None. FINDINGS: Contralateral Subclavian Vein: Respiratory phasicity is normal and symmetric with the symptomatic side. No evidence of thrombus. Normal compressibility. Internal Jugular Vein: No evidence of thrombus. Normal compressibility, respiratory phasicity and response to augmentation. Subclavian Vein: No evidence of thrombus. Normal compressibility, respiratory phasicity and response to augmentation. Axillary Vein: No evidence of  thrombus. Normal compressibility, respiratory phasicity and response to augmentation. Cephalic Vein: No evidence of thrombus. Normal compressibility, respiratory phasicity and response to augmentation. Basilic Vein: No evidence of thrombus. Normal compressibility, respiratory phasicity and response to augmentation. Brachial Veins: No evidence of thrombus. Normal compressibility, respiratory phasicity and response to augmentation. Radial Veins: No evidence of thrombus. Normal compressibility, respiratory phasicity and response to augmentation. Ulnar Veins: No evidence of thrombus. Normal compressibility, respiratory phasicity and response to augmentation. Venous Reflux:  None visualized. Other Findings: There is a hypoechoic collection seen in the region of the left axilla at the level of though separate from the axillary vein which could reflect a hematoma or seroma though is overall indeterminate on these ultrasound images. Additionally, there is diffuse soft tissue edema of the left upper extremity most pronounced at the level of the upper arm. IMPRESSION: 1. No evidence of DVT within the left upper extremity. 2. Hypoechoic collection in the region of the left axilla measuring 2.9 by 1.0 cm in transverse dimensions (image 12/37) could reflect a hematoma in the setting of trauma though is indeterminate on these ultrasound images. Could consider further evaluation with cross-sectional imaging. 3. Diffuse soft tissue edema of the left upper extremity, most pronounced at the level of the upper arm. Electronically Signed   By: Lovena Le M.D.   On: 11/10/2019 17:55   DG HIP UNILAT W OR W/O PELVIS 2-3 VIEWS LEFT  Result Date: 11/11/2019 CLINICAL DATA:  Postop, left hip arthroplasty EXAM: DG HIP (WITH OR WITHOUT PELVIS) 2-3V LEFT COMPARISON:  Radiograph 11/10/2019 FINDINGS: Patient appears to be post left hip hemiarthroplasty for the repair of the previously seen displaced femoral neck fracture. Articulating  femoral component appears normally seated within the acetabulum. Few adjacent postsurgical ossifications are present. Associated postsurgical soft tissue changes including soft tissue gas, intra-articular gas and overlying skin staples are present. Additional surgical clips noted in the medial thighs, likely related to prior vascular surgeries. Remaining bones of the pelvis are intact and congruent. The osseous structures appear diffusely demineralized which may limit detection of small or nondisplaced fractures. IMPRESSION: Status post left hip hemiarthroplasty without evidence of acute hardware complication. Electronically Signed   By: Lovena Le M.D.   On: 11/11/2019 19:35   DG Femur Min 2 Views Left  Result Date: 11/10/2019 CLINICAL DATA:  Fall 2 days ago.  Hip deformity. EXAM: LEFT FEMUR 2 VIEWS COMPARISON:  Pelvic radiographs same date. One view abdomen 07/15/2019. FINDINGS: The bones are diffusely demineralized. There is a superiorly displaced subcapital fracture of the left femoral neck. The fracture margins are somewhat ill-defined, and this could be subacute in age. The distal femur is intact. The visualized bony pelvis is intact. Mild degenerative changes are present at the left hip and knee. There are  vascular clips in the left groin and medial thigh. IMPRESSION: Superiorly displaced subcapital fracture of the left femoral neck, possibly subacute in age. Electronically Signed   By: Richardean Sale M.D.   On: 11/10/2019 17:36    Microbiology: Recent Results (from the past 240 hour(s))  Respiratory Panel by RT PCR (Flu A&B, Covid) - Nasopharyngeal Swab     Status: None   Collection Time: 11/10/19  4:44 PM   Specimen: Nasopharyngeal Swab  Result Value Ref Range Status   SARS Coronavirus 2 by RT PCR NEGATIVE NEGATIVE Final    Comment: (NOTE) SARS-CoV-2 target nucleic acids are NOT DETECTED.  The SARS-CoV-2 RNA is generally detectable in upper respiratoy specimens during the acute phase of  infection. The lowest concentration of SARS-CoV-2 viral copies this assay can detect is 131 copies/mL. A negative result does not preclude SARS-Cov-2 infection and should not be used as the sole basis for treatment or other patient management decisions. A negative result may occur with  improper specimen collection/handling, submission of specimen other than nasopharyngeal swab, presence of viral mutation(s) within the areas targeted by this assay, and inadequate number of viral copies (<131 copies/mL). A negative result must be combined with clinical observations, patient history, and epidemiological information. The expected result is Negative.  Fact Sheet for Patients:  PinkCheek.be  Fact Sheet for Healthcare Providers:  GravelBags.it  This test is no t yet approved or cleared by the [place] FDA and  has been authorized for detection and/or diagnosis of SARS-CoV-2 by FDA under an Emergency Use Authorization (EUA). This EUA will remain  in effect (meaning this test can be used) for the duration of the COVID-19 declaration under Section 564(b)(1) of the Act, 21 U.S.C. section 360bbb-3(b)(1), unless the authorization is terminated or revoked sooner.     Influenza A by PCR NEGATIVE NEGATIVE Final   Influenza B by PCR NEGATIVE NEGATIVE Final    Comment: (NOTE) The Xpert Xpress SARS-CoV-2/FLU/RSV assay is intended as an aid in  the diagnosis of influenza from Nasopharyngeal swab specimens and  should not be used as a sole basis for treatment. Nasal washings and  aspirates are unacceptable for Xpert Xpress SARS-CoV-2/FLU/RSV  testing.  Fact Sheet for Patients: PinkCheek.be  Fact Sheet for Healthcare Providers: GravelBags.it  This test is not yet approved or cleared by the [place] FDA and  has been authorized for detection and/or diagnosis of SARS-CoV-2  by  FDA under an Emergency Use Authorization (EUA). This EUA will remain  in effect (meaning this test can be used) for the duration of the  Covid-19 declaration under Section 564(b)(1) of the Act, 21  U.S.C. section 360bbb-3(b)(1), unless the authorization is  terminated or revoked. Performed at [facility], [address] [place], [place] [address]      Labs: Basic Metabolic Panel: Recent Labs  Lab 11/10/19 1603 11/11/19 0500 11/12/19 0331  NA 133* 132* 132*  K 3.3* 3.5 3.8  CL 98 98 102  CO2 25 26 22   GLUCOSE 115* 94 88  BUN 24* 19 18  CREATININE 0.64 0.47 0.69  CALCIUM 7.6* 7.4* 7.7*   Liver Function Tests: Recent Labs  Lab 11/10/19 1603  AST 40  ALT 19  ALKPHOS 160*  BILITOT 1.4*  PROT 5.4*  ALBUMIN 2.1*   No results for input(s): LIPASE, AMYLASE in the last 168 hours. No results for input(s): AMMONIA in the last 168 hours. CBC: Recent Labs  Lab 11/11/19 0500 11/12/19 0331 11/13/19 0355 11/14/19 0502  11/15/19 0404  WBC 7.5 10.4 9.0 8.6 7.1  NEUTROABS  --  8.5* 7.3 6.6 4.7  HGB 7.2* 9.1* 8.3* 8.3* 8.3*  HCT 22.9* 26.9* 26.9* 26.9* 26.5*  MCV 97.0 92.4 97.5 97.5 97.8  PLT 415* 298 282 271 291   Cardiac Enzymes: No results for input(s): CKTOTAL, CKMB, CKMBINDEX, TROPONINI in the last 168 hours. BNP: BNP (last 3 results) No results for input(s): BNP in the last 8760 hours.  ProBNP (last 3 results) No results for input(s): PROBNP in the last 8760 hours.  CBG: No results for input(s): GLUCAP in the last 168 hours.     Signed:  Kayleen Memos, MD [facility] 11/15/2019, 12:01 PM

## 2019-11-15 NOTE — Consult Note (Signed)
[facility] for Warfarin Indication: CVA  Allergies  Allergen Reactions  . Aspirin Other (See Comments)    ulcer  . Sulfa Antibiotics Other (See Comments)    unknown    Patient Measurements: Height: 4\' 9"  (144.8 cm) Weight: 37 kg (81 lb 9.1 oz) IBW/kg (Calculated) : 38.6  Vital Signs: Temp: 99.3 F (37.4 C) (10/13 2315) Temp Source: Oral (10/13 2315) BP: 133/68 (10/13 2315) Pulse Rate: 80 (10/13 2315)  Labs: Recent Labs    11/13/19 0355 11/13/19 0355 11/14/19 0502 11/15/19 0404  HGB 8.3*   < > 8.3* 8.3*  HCT 26.9*  --  26.9* 26.5*  PLT 282  --  271 291  LABPROT 23.4*  --  20.6* 18.3*  INR 2.2*  --  1.8* 1.6*   < > = values in this interval not displayed.    Estimated Creatinine Clearance: 38.2 mL/min (by C-G formula based on SCr of 0.69 mg/dL).   Medical History: Past Medical History:  Diagnosis Date  . Aneurysm ([place])   . Anxiety   . Arthritis   . Cancer ([place])   . CHF (congestive heart failure) ([place])   . COPD (chronic obstructive pulmonary disease) ([place])   . Hyperlipidemia   . Hypertension   . Stroke ([place])   . Thyroid disease     Medications:  PTA warfarin 1mg  daily  Assessment: 69yo female presented with L hip pain. PMH COPD, anemia, CHF, HLD, HTN, hypothyroidism, and left hemiparesis following a stroke (on warfarin). Patient's last dose of warfarin was her home dose of 1mg  on 10/8. Patient received IV vitamin K 1mg  x2 doses in anticipation for procedure (arthroplasty bipolar hip) and warfarin was held.   Pharmacy has been consulted for warfarin dosing and monitoring.   HGB stable  Platelets stable @ 271  Date INR  Dose 10/9 1.7>1.3 Held 10/10 1.2  2mg  10/11  1.2  2mg  10/12  2.2  1mg  10/13   1.8   1.5mg   Goal of Therapy:  INR 2-3 Monitor platelets by anticoagulation protocol: Yes   Plan:  INR 1.6 is sub-therapeutic.   Will give 2 mg dose tonight (100% greater than home dose).    Monitor daily INR  and [place], PharmD, BCPS Clinical Pharmacist 11/15/2019 7:42 AM

## 2019-11-15 NOTE — Progress Notes (Signed)
Pt transported to [facility] via [facility].  Pt discharged instructions given to EMS to give to facility ([facility]).  Report called and given to receiving staff, Maudie Mercury. VSS, no s/s of distress.

## 2019-11-18 DIAGNOSIS — I4891 Unspecified atrial fibrillation: Secondary | ICD-10-CM | POA: Diagnosis not present

## 2019-11-18 DIAGNOSIS — E039 Hypothyroidism, unspecified: Secondary | ICD-10-CM | POA: Diagnosis not present

## 2019-11-18 DIAGNOSIS — E785 Hyperlipidemia, unspecified: Secondary | ICD-10-CM | POA: Diagnosis not present

## 2019-11-18 DIAGNOSIS — I10 Essential (primary) hypertension: Secondary | ICD-10-CM | POA: Diagnosis not present

## 2019-11-18 DIAGNOSIS — S2242XD Multiple fractures of ribs, left side, subsequent encounter for fracture with routine healing: Secondary | ICD-10-CM | POA: Diagnosis not present

## 2019-11-18 DIAGNOSIS — S72002D Fracture of unspecified part of neck of left femur, subsequent encounter for closed fracture with routine healing: Secondary | ICD-10-CM | POA: Diagnosis not present

## 2019-11-18 DIAGNOSIS — D649 Anemia, unspecified: Secondary | ICD-10-CM | POA: Diagnosis not present

## 2019-11-21 ENCOUNTER — Other Ambulatory Visit: Payer: Self-pay | Admitting: *Deleted

## 2019-11-21 NOTE — Patient Outreach (Addendum)
Member screened for potential [facility] Care Management needs as a benefit of [place] Medicare.  Per Patient Pearletha Forge member resides in [facility].  Communication sent to [facility] SW to collaborate about anticipated dc plans and potential [facility] Care Management needs.  Will continue to follow while member resides in SNF.    [name], MSN-Ed, RN,BSN [facility] Acute Care Coordinator [phone] [facility]) [phone]  (Toll free office)

## 2019-11-26 DIAGNOSIS — Z96642 Presence of left artificial hip joint: Secondary | ICD-10-CM | POA: Diagnosis not present

## 2019-11-29 ENCOUNTER — Ambulatory Visit: Payer: Medicare Other | Admitting: Hospice and Palliative Medicine

## 2019-11-29 DIAGNOSIS — J449 Chronic obstructive pulmonary disease, unspecified: Secondary | ICD-10-CM | POA: Diagnosis not present

## 2019-11-29 DIAGNOSIS — E440 Moderate protein-calorie malnutrition: Secondary | ICD-10-CM | POA: Diagnosis not present

## 2019-11-29 DIAGNOSIS — D649 Anemia, unspecified: Secondary | ICD-10-CM | POA: Diagnosis not present

## 2019-11-29 DIAGNOSIS — I509 Heart failure, unspecified: Secondary | ICD-10-CM | POA: Diagnosis not present

## 2019-11-29 DIAGNOSIS — I480 Paroxysmal atrial fibrillation: Secondary | ICD-10-CM | POA: Diagnosis not present

## 2019-11-29 DIAGNOSIS — I2510 Atherosclerotic heart disease of native coronary artery without angina pectoris: Secondary | ICD-10-CM | POA: Diagnosis not present

## 2019-11-29 DIAGNOSIS — M80052D Age-related osteoporosis with current pathological fracture, left femur, subsequent encounter for fracture with routine healing: Secondary | ICD-10-CM | POA: Diagnosis not present

## 2019-11-29 DIAGNOSIS — E039 Hypothyroidism, unspecified: Secondary | ICD-10-CM | POA: Diagnosis not present

## 2019-12-05 DIAGNOSIS — E440 Moderate protein-calorie malnutrition: Secondary | ICD-10-CM | POA: Diagnosis not present

## 2019-12-05 DIAGNOSIS — M80052D Age-related osteoporosis with current pathological fracture, left femur, subsequent encounter for fracture with routine healing: Secondary | ICD-10-CM | POA: Diagnosis not present

## 2019-12-05 DIAGNOSIS — I480 Paroxysmal atrial fibrillation: Secondary | ICD-10-CM | POA: Diagnosis not present

## 2019-12-05 DIAGNOSIS — I509 Heart failure, unspecified: Secondary | ICD-10-CM | POA: Diagnosis not present

## 2019-12-05 DIAGNOSIS — I2510 Atherosclerotic heart disease of native coronary artery without angina pectoris: Secondary | ICD-10-CM | POA: Diagnosis not present

## 2019-12-05 DIAGNOSIS — E559 Vitamin D deficiency, unspecified: Secondary | ICD-10-CM | POA: Diagnosis not present

## 2019-12-20 DIAGNOSIS — E440 Moderate protein-calorie malnutrition: Secondary | ICD-10-CM | POA: Diagnosis not present

## 2019-12-20 DIAGNOSIS — J302 Other seasonal allergic rhinitis: Secondary | ICD-10-CM | POA: Diagnosis not present

## 2019-12-20 DIAGNOSIS — I480 Paroxysmal atrial fibrillation: Secondary | ICD-10-CM | POA: Diagnosis not present

## 2019-12-20 DIAGNOSIS — M80052D Age-related osteoporosis with current pathological fracture, left femur, subsequent encounter for fracture with routine healing: Secondary | ICD-10-CM | POA: Diagnosis not present

## 2019-12-24 DIAGNOSIS — R52 Pain, unspecified: Secondary | ICD-10-CM | POA: Diagnosis not present

## 2019-12-24 DIAGNOSIS — I480 Paroxysmal atrial fibrillation: Secondary | ICD-10-CM | POA: Diagnosis not present

## 2019-12-24 DIAGNOSIS — E039 Hypothyroidism, unspecified: Secondary | ICD-10-CM | POA: Diagnosis not present

## 2019-12-24 DIAGNOSIS — S72002D Fracture of unspecified part of neck of left femur, subsequent encounter for closed fracture with routine healing: Secondary | ICD-10-CM | POA: Diagnosis not present

## 2019-12-24 DIAGNOSIS — K5900 Constipation, unspecified: Secondary | ICD-10-CM | POA: Diagnosis not present

## 2019-12-24 DIAGNOSIS — I69354 Hemiplegia and hemiparesis following cerebral infarction affecting left non-dominant side: Secondary | ICD-10-CM | POA: Diagnosis not present

## 2019-12-24 DIAGNOSIS — S2239XD Fracture of one rib, unspecified side, subsequent encounter for fracture with routine healing: Secondary | ICD-10-CM | POA: Diagnosis not present

## 2019-12-25 DIAGNOSIS — I480 Paroxysmal atrial fibrillation: Secondary | ICD-10-CM | POA: Diagnosis not present

## 2019-12-25 DIAGNOSIS — E559 Vitamin D deficiency, unspecified: Secondary | ICD-10-CM | POA: Diagnosis not present

## 2019-12-25 DIAGNOSIS — M80052D Age-related osteoporosis with current pathological fracture, left femur, subsequent encounter for fracture with routine healing: Secondary | ICD-10-CM | POA: Diagnosis not present

## 2019-12-25 DIAGNOSIS — I509 Heart failure, unspecified: Secondary | ICD-10-CM | POA: Diagnosis not present

## 2019-12-25 DIAGNOSIS — E039 Hypothyroidism, unspecified: Secondary | ICD-10-CM | POA: Diagnosis not present

## 2019-12-25 DIAGNOSIS — J449 Chronic obstructive pulmonary disease, unspecified: Secondary | ICD-10-CM | POA: Diagnosis not present

## 2019-12-25 DIAGNOSIS — E440 Moderate protein-calorie malnutrition: Secondary | ICD-10-CM | POA: Diagnosis not present

## 2020-01-18 DIAGNOSIS — M800AXD Age-related osteoporosis with current pathological fracture, other site, subsequent encounter for fracture with routine healing: Secondary | ICD-10-CM | POA: Diagnosis not present

## 2020-01-18 DIAGNOSIS — E569 Vitamin deficiency, unspecified: Secondary | ICD-10-CM | POA: Diagnosis not present

## 2020-01-18 DIAGNOSIS — I2510 Atherosclerotic heart disease of native coronary artery without angina pectoris: Secondary | ICD-10-CM | POA: Diagnosis not present

## 2020-01-18 DIAGNOSIS — M5135 Other intervertebral disc degeneration, thoracolumbar region: Secondary | ICD-10-CM | POA: Diagnosis not present

## 2020-01-18 DIAGNOSIS — Q762 Congenital spondylolisthesis: Secondary | ICD-10-CM | POA: Diagnosis not present

## 2020-01-18 DIAGNOSIS — J439 Emphysema, unspecified: Secondary | ICD-10-CM | POA: Diagnosis not present

## 2020-01-18 DIAGNOSIS — G8929 Other chronic pain: Secondary | ICD-10-CM | POA: Diagnosis not present

## 2020-01-18 DIAGNOSIS — I69354 Hemiplegia and hemiparesis following cerebral infarction affecting left non-dominant side: Secondary | ICD-10-CM | POA: Diagnosis not present

## 2020-01-18 DIAGNOSIS — J309 Allergic rhinitis, unspecified: Secondary | ICD-10-CM | POA: Diagnosis not present

## 2020-01-18 DIAGNOSIS — Z96642 Presence of left artificial hip joint: Secondary | ICD-10-CM | POA: Diagnosis not present

## 2020-01-18 DIAGNOSIS — K5900 Constipation, unspecified: Secondary | ICD-10-CM | POA: Diagnosis not present

## 2020-01-18 DIAGNOSIS — M5440 Lumbago with sciatica, unspecified side: Secondary | ICD-10-CM | POA: Diagnosis not present

## 2020-01-18 DIAGNOSIS — I480 Paroxysmal atrial fibrillation: Secondary | ICD-10-CM | POA: Diagnosis not present

## 2020-01-18 DIAGNOSIS — I252 Old myocardial infarction: Secondary | ICD-10-CM | POA: Diagnosis not present

## 2020-01-18 DIAGNOSIS — E440 Moderate protein-calorie malnutrition: Secondary | ICD-10-CM | POA: Diagnosis not present

## 2020-01-18 DIAGNOSIS — F411 Generalized anxiety disorder: Secondary | ICD-10-CM | POA: Diagnosis not present

## 2020-01-18 DIAGNOSIS — Z79891 Long term (current) use of opiate analgesic: Secondary | ICD-10-CM | POA: Diagnosis not present

## 2020-01-18 DIAGNOSIS — E039 Hypothyroidism, unspecified: Secondary | ICD-10-CM | POA: Diagnosis not present

## 2020-01-18 DIAGNOSIS — E559 Vitamin D deficiency, unspecified: Secondary | ICD-10-CM | POA: Diagnosis not present

## 2020-01-18 DIAGNOSIS — Z993 Dependence on wheelchair: Secondary | ICD-10-CM | POA: Diagnosis not present

## 2020-01-18 DIAGNOSIS — E782 Mixed hyperlipidemia: Secondary | ICD-10-CM | POA: Diagnosis not present

## 2020-01-18 DIAGNOSIS — I110 Hypertensive heart disease with heart failure: Secondary | ICD-10-CM | POA: Diagnosis not present

## 2020-01-18 DIAGNOSIS — I509 Heart failure, unspecified: Secondary | ICD-10-CM | POA: Diagnosis not present

## 2020-01-18 DIAGNOSIS — F17210 Nicotine dependence, cigarettes, uncomplicated: Secondary | ICD-10-CM | POA: Diagnosis not present

## 2020-01-18 DIAGNOSIS — M80052D Age-related osteoporosis with current pathological fracture, left femur, subsequent encounter for fracture with routine healing: Secondary | ICD-10-CM | POA: Diagnosis not present

## 2020-01-18 DIAGNOSIS — Z4789 Encounter for other orthopedic aftercare: Secondary | ICD-10-CM | POA: Diagnosis not present

## 2020-02-12 ENCOUNTER — Ambulatory Visit: Payer: Medicare Other | Admitting: Hospice and Palliative Medicine

## 2020-02-15 ENCOUNTER — Telehealth: Payer: Self-pay

## 2020-02-15 NOTE — Telephone Encounter (Signed)
Lmom to rs missed ov from 02-12-20. Loma Sousa

## 2020-03-31 ENCOUNTER — Ambulatory Visit (INDEPENDENT_AMBULATORY_CARE_PROVIDER_SITE_OTHER): Payer: Medicare Other | Admitting: Hospice and Palliative Medicine

## 2020-03-31 ENCOUNTER — Other Ambulatory Visit: Payer: Self-pay

## 2020-03-31 ENCOUNTER — Telehealth: Payer: Self-pay

## 2020-03-31 ENCOUNTER — Encounter: Payer: Self-pay | Admitting: Hospice and Palliative Medicine

## 2020-03-31 VITALS — BP 138/88 | HR 88 | Temp 97.6°F | Resp 16 | Ht <= 58 in | Wt 87.0 lb

## 2020-03-31 DIAGNOSIS — Z7901 Long term (current) use of anticoagulants: Secondary | ICD-10-CM

## 2020-03-31 DIAGNOSIS — I480 Paroxysmal atrial fibrillation: Secondary | ICD-10-CM

## 2020-03-31 DIAGNOSIS — E039 Hypothyroidism, unspecified: Secondary | ICD-10-CM

## 2020-03-31 DIAGNOSIS — G894 Chronic pain syndrome: Secondary | ICD-10-CM | POA: Diagnosis not present

## 2020-03-31 DIAGNOSIS — R5383 Other fatigue: Secondary | ICD-10-CM | POA: Diagnosis not present

## 2020-03-31 DIAGNOSIS — R627 Adult failure to thrive: Secondary | ICD-10-CM

## 2020-03-31 DIAGNOSIS — E782 Mixed hyperlipidemia: Secondary | ICD-10-CM

## 2020-03-31 DIAGNOSIS — E038 Other specified hypothyroidism: Secondary | ICD-10-CM | POA: Diagnosis not present

## 2020-03-31 LAB — POCT INR

## 2020-03-31 MED ORDER — METOPROLOL TARTRATE 25 MG PO TABS
12.5000 mg | ORAL_TABLET | Freq: Two times a day (BID) | ORAL | 2 refills | Status: DC
Start: 2020-03-31 — End: 2020-03-31

## 2020-03-31 MED ORDER — LEVOTHYROXINE SODIUM 50 MCG PO TABS: 50.0000 ug | ORAL_TABLET | Freq: Every day | ORAL | 3 refills | Status: DC

## 2020-03-31 MED ORDER — WARFARIN SODIUM 1 MG PO TABS: 1.0000 mg | ORAL_TABLET | Freq: Every day | ORAL | 0 refills | Status: DC

## 2020-03-31 MED ORDER — FENOFIBRATE 145 MG PO TABS: 145.0000 mg | ORAL_TABLET | Freq: Every day | ORAL | 3 refills | Status: DC

## 2020-03-31 MED ORDER — METOPROLOL TARTRATE 25 MG PO TABS: 12.5000 mg | ORAL_TABLET | Freq: Two times a day (BID) | ORAL | 2 refills | Status: DC

## 2020-03-31 MED ORDER — FUROSEMIDE 20 MG PO TABS
ORAL_TABLET | ORAL | 1 refills | Status: DC
Start: 2020-03-31 — End: 2020-03-31

## 2020-03-31 MED ORDER — ATORVASTATIN CALCIUM 40 MG PO TABS: 40.0000 mg | ORAL_TABLET | Freq: Every day | ORAL | 3 refills | Status: DC

## 2020-03-31 MED ORDER — FUROSEMIDE 20 MG PO TABS
ORAL_TABLET | ORAL | 1 refills | Status: DC
Start: 2020-03-31 — End: 2020-10-20

## 2020-03-31 MED ORDER — HYDROCODONE-ACETAMINOPHEN 5-325 MG PO TABS: 1.0000 | ORAL_TABLET | Freq: Four times a day (QID) | ORAL | 0 refills | Status: DC | PRN

## 2020-03-31 NOTE — Progress Notes (Signed)
[facility] [place], [place] [address]  Internal MEDICINE  Office Visit Note  Patient Name: Teresa Holmes  [id number]  [id number]  Date of Service: 04/01/2020  Chief Complaint  Patient presents with  . Follow-up    Cannot walk, has been to therapy but discharged her  . Hyperlipidemia  . Hypertension  . Quality Metric Gaps    Flu, mammogram     HPI Patient is here for routine follow-up Accompanied by her private care taker Care taker stays with her 12-14 hours every day and will occasionally spend the night Extensive history including History of CVA with hemiparesis to left side Chronic pain disorder Gait difficulty Urinary incontinence  Has had multiple falls in the past--has spent time in rehab facilities Recently completed PT--has been discharged as she was not showing further progression At this time she is essentially wheelchair bound, able to make small transitions between wheelchair and toilet Private caregiver provides full assistance care  Finances are also a major concern, after paying all of her bills she does not have enough money left over for food or other supplies  Requesting refills of medication Warfarin was held during most recent hospitalization, just recently restarted medication, caregiver typically gives in the evenings but has struggled to remember to give it to her the last few nights Needs updated labs and INR Offered to draw labs in office due to difficulty with transportation and leaving home    Current Medication: Outpatient Encounter Medications as of 03/31/2020  Medication Sig Note  . polyethylene glycol (MIRALAX / GLYCOLAX) 17 g packet Take 17 g by mouth daily.   . [DISCONTINUED] atorvastatin (LIPITOR) 40 MG tablet Take 1 tablet (40 mg total) by mouth daily.   . [DISCONTINUED] fenofibrate (TRICOR) 145 MG tablet Take 1 tablet (145 mg total) by mouth daily.   . [DISCONTINUED] furosemide (LASIX) 20 MG tablet 20 MG  FOR 3 DAYS THEN ONE EVERY OTHER DAY   . [DISCONTINUED] HYDROcodone-acetaminophen (NORCO/VICODIN) 5-325 MG tablet Take 1-2 tablets by mouth every 6 (six) hours as needed for moderate pain.   . [DISCONTINUED] levothyroxine (SYNTHROID) 50 MCG tablet Take 1 tablet (50 mcg total) by mouth daily before breakfast. 11/10/2019: Patient states she takes all her medications at night  . [DISCONTINUED] metoprolol tartrate (LOPRESSOR) 25 MG tablet Take 0.5 tablets (12.5 mg total) by mouth 2 (two) times daily.   . [DISCONTINUED] warfarin (COUMADIN) 1 MG tablet Take 1 mg by mouth daily. 11/10/2019: Patient states she takes 1 mg every day  . atorvastatin (LIPITOR) 40 MG tablet Take 1 tablet (40 mg total) by mouth daily.   . fenofibrate (TRICOR) 145 MG tablet Take 1 tablet (145 mg total) by mouth daily.   . furosemide (LASIX) 20 MG tablet 20 MG FOR 3 DAYS THEN ONE EVERY OTHER DAY   . HYDROcodone-acetaminophen (NORCO/VICODIN) 5-325 MG tablet Take 1-2 tablets by mouth every 6 (six) hours as needed for moderate pain.   Marland Kitchen levothyroxine (SYNTHROID) 50 MCG tablet Take 1 tablet (50 mcg total) by mouth daily before breakfast.   . metoprolol tartrate (LOPRESSOR) 25 MG tablet Take 0.5 tablets (12.5 mg total) by mouth 2 (two) times daily.   Marland Kitchen warfarin (COUMADIN) 1 MG tablet Take 1 tablet (1 mg total) by mouth daily.   . [DISCONTINUED] atorvastatin (LIPITOR) 40 MG tablet Take 1 tablet (40 mg total) by mouth daily.   . [DISCONTINUED] fenofibrate (TRICOR) 145 MG tablet Take 1 tablet (145 mg total) by mouth daily.   . [  DISCONTINUED] furosemide (LASIX) 20 MG tablet 20 MG FOR 3 DAYS THEN ONE EVERY OTHER DAY   . [DISCONTINUED] HYDROcodone-acetaminophen (NORCO/VICODIN) 5-325 MG tablet Take 1-2 tablets by mouth every 6 (six) hours as needed for moderate pain.   . [DISCONTINUED] levothyroxine (SYNTHROID) 50 MCG tablet Take 1 tablet (50 mcg total) by mouth daily before breakfast.   . [DISCONTINUED] metoprolol tartrate (LOPRESSOR) 25 MG  tablet Take 0.5 tablets (12.5 mg total) by mouth 2 (two) times daily.    No facility-administered encounter medications on file as of 03/31/2020.    Surgical History: Past Surgical History:  Procedure Laterality Date  . ABDOMINAL HYSTERECTOMY    . AORTIC/RENAL BYPASS    . bypass graft    . CHOLECYSTECTOMY    . CORONARY ARTERY BYPASS GRAFT    . HIP ARTHROPLASTY Left 11/11/2019   Procedure: ARTHROPLASTY BIPOLAR HIP (HEMIARTHROPLASTY);  Surgeon: Corky Mull, MD;  Location: [facility] ORS;  Service: Orthopedics;  Laterality: Left;    Medical History: Past Medical History:  Diagnosis Date  . Aneurysm ([place])   . Anxiety   . Arthritis   . Cancer ([place])   . CHF (congestive heart failure) ([place])   . COPD (chronic obstructive pulmonary disease) ([place])   . Hyperlipidemia   . Hypertension   . Stroke ([place])   . Thyroid disease     Family History: Family History  Problem Relation Age of Onset  . Cancer Mother     Social History   Socioeconomic History  . Marital status: Married    Spouse name: Not on file  . Number of children: Not on file  . Years of education: Not on file  . Highest education level: Not on file  Occupational History  . Not on file  Tobacco Use  . Smoking status: Current Every Day Smoker    Types: Cigarettes  . Smokeless tobacco: Never Used  Vaping Use  . Vaping Use: Never used  Substance and Sexual Activity  . Alcohol use: No  . Drug use: Never  . Sexual activity: Not on file  Other Topics Concern  . Not on file  Social History Narrative  . Not on file   Social Determinants of Health   Financial Resource Strain: Not on file  Food Insecurity: Not on file  Transportation Needs: Not on file  Physical Activity: Not on file  Stress: Not on file  Social Connections: Not on file  Intimate Partner Violence: Not on file      Review of Systems  Constitutional: Negative for chills, diaphoresis and fatigue.  HENT: Negative for ear pain, postnasal drip and  sinus pressure.   Eyes: Negative for photophobia, discharge, redness, itching and visual disturbance.  Respiratory: Negative for cough, shortness of breath and wheezing.   Cardiovascular: Negative for chest pain, palpitations and leg swelling.  Gastrointestinal: Negative for abdominal pain, constipation, diarrhea, nausea and vomiting.  Genitourinary: Negative for dysuria and flank pain.  Musculoskeletal: Positive for arthralgias, back pain, gait problem, myalgias, neck pain and neck stiffness.  Skin: Negative for color change.  Allergic/Immunologic: Negative for environmental allergies and food allergies.  Neurological: Negative for dizziness and headaches.       Left sided paralysis due to history of CVA  Hematological: Does not bruise/bleed easily.  Psychiatric/Behavioral: Positive for behavioral problems (depression). Negative for agitation and hallucinations. The patient is nervous/anxious.     Vital Signs: BP 138/88   Pulse 88   Temp 97.6 F (36.4 C)   Resp 16  Ht 4\' 9"  (1.448 m)   Wt 87 lb (39.5 kg)   SpO2 97%   BMI 18.83 kg/m    Physical Exam Vitals reviewed.  Constitutional:      Appearance: Normal appearance.  Cardiovascular:     Rate and Rhythm: Normal rate and regular rhythm.     Pulses: Normal pulses.     Heart sounds: Normal heart sounds.  Pulmonary:     Effort: Pulmonary effort is normal.     Breath sounds: Normal breath sounds.  Abdominal:     General: Abdomen is flat.     Palpations: Abdomen is soft.  Musculoskeletal:     Cervical back: Normal range of motion.     Comments: Declining mobility- in wheelchair today for ambulation  Skin:    General: Skin is warm.  Neurological:     General: No focal deficit present.     Mental Status: She is alert and oriented to person, place, and time. Mental status is at baseline.  Psychiatric:        Mood and Affect: Mood normal.        Behavior: Behavior normal.        Thought Content: Thought content normal.         Judgment: Judgment normal.    Assessment/Plan: 1. Adult failure to thrive BMI 18 Struggles financially to afford food Family not local and do not offer support Private CNA close friend At this time she is not interested in moving into a care facility, also not wanting a Education officer, museum due to assistance provided by private CNA  2. Mixed hyperlipidemia Stable, requesting refills - atorvastatin (LIPITOR) 40 MG tablet; Take 1 tablet (40 mg total) by mouth daily.  Dispense: 30 tablet; Refill: 3 - fenofibrate (TRICOR) 145 MG tablet; Take 1 tablet (145 mg total) by mouth daily.  Dispense: 30 tablet; Refill: 3 - Lipid Panel With LDL/HDL Ratio  3. Acquired hypothyroidism Will updated labs, requesting refills, will adjust therapy based on recent levels - levothyroxine (SYNTHROID) 50 MCG tablet; Take 1 tablet (50 mcg total) by mouth daily before breakfast.  Dispense: 30 tablet; Refill: 3  4. Chronic pain disorder Continue with limited narcotics to help control pain [place] Controlled Substance Database was reviewed by me for overdose risk score (ORS) - HYDROcodone-acetaminophen (NORCO/VICODIN) 5-325 MG tablet; Take 1-2 tablets by mouth every 6 (six) hours as needed for moderate pain.  Dispense: 30 tablet; Refill: 0  5. Fatigue, unspecified type - CBC with Differential/Platelet - Comprehensive Metabolic Panel (CMET) -TSH/T4  6. Paroxysmal atrial fibrillation (HCC) NSR today, requesting refills Life long anticoagulation due to history of CVA - metoprolol tartrate (LOPRESSOR) 25 MG tablet; Take 0.5 tablets (12.5 mg total) by mouth 2 (two) times daily.  Dispense: 60 tablet; Refill: 2 - warfarin (COUMADIN) 1 MG tablet; Take 1 tablet (1 mg total) by mouth daily.  Dispense: 90 tablet; Refill: 0  7. Long term (current) use of anticoagulants INR 1.1--has not been taking Warfarin regularly, advised to restart tonight with prescribed dose Will set up for home INR readings and will adjust therapy -  POCT INR  General Counseling: Teresa Holmes verbalizes understanding of the findings of todays visit and agrees with plan of treatment. I have discussed any further diagnostic evaluation that may be needed or ordered today. We also reviewed her medications today. she has been encouraged to call the office with any questions or concerns that should arise related to todays visit.    Orders Placed This Encounter  Procedures  . CBC with Differential/Platelet  . Lipid Panel With LDL/HDL Ratio  . TSH + free T4  . Comprehensive Metabolic Panel (CMET)  . POCT INR    Meds ordered this encounter  Medications  . DISCONTD: atorvastatin (LIPITOR) 40 MG tablet    Sig: Take 1 tablet (40 mg total) by mouth daily.    Dispense:  30 tablet    Refill:  3  . DISCONTD: fenofibrate (TRICOR) 145 MG tablet    Sig: Take 1 tablet (145 mg total) by mouth daily.    Dispense:  30 tablet    Refill:  3  . DISCONTD: furosemide (LASIX) 20 MG tablet    Sig: 20 MG FOR 3 DAYS THEN ONE EVERY OTHER DAY    Dispense:  30 tablet    Refill:  1  . DISCONTD: HYDROcodone-acetaminophen (NORCO/VICODIN) 5-325 MG tablet    Sig: Take 1-2 tablets by mouth every 6 (six) hours as needed for moderate pain.    Dispense:  30 tablet    Refill:  0  . DISCONTD: levothyroxine (SYNTHROID) 50 MCG tablet    Sig: Take 1 tablet (50 mcg total) by mouth daily before breakfast.    Dispense:  30 tablet    Refill:  3  . DISCONTD: metoprolol tartrate (LOPRESSOR) 25 MG tablet    Sig: Take 0.5 tablets (12.5 mg total) by mouth 2 (two) times daily.    Dispense:  60 tablet    Refill:  2  . HYDROcodone-acetaminophen (NORCO/VICODIN) 5-325 MG tablet    Sig: Take 1-2 tablets by mouth every 6 (six) hours as needed for moderate pain.    Dispense:  30 tablet    Refill:  0  . atorvastatin (LIPITOR) 40 MG tablet    Sig: Take 1 tablet (40 mg total) by mouth daily.    Dispense:  30 tablet    Refill:  3  . fenofibrate (TRICOR) 145 MG tablet    Sig: Take 1 tablet  (145 mg total) by mouth daily.    Dispense:  30 tablet    Refill:  3  . furosemide (LASIX) 20 MG tablet    Sig: 20 MG FOR 3 DAYS THEN ONE EVERY OTHER DAY    Dispense:  30 tablet    Refill:  1  . levothyroxine (SYNTHROID) 50 MCG tablet    Sig: Take 1 tablet (50 mcg total) by mouth daily before breakfast.    Dispense:  30 tablet    Refill:  3  . metoprolol tartrate (LOPRESSOR) 25 MG tablet    Sig: Take 0.5 tablets (12.5 mg total) by mouth 2 (two) times daily.    Dispense:  60 tablet    Refill:  2  . warfarin (COUMADIN) 1 MG tablet    Sig: Take 1 tablet (1 mg total) by mouth daily.    Dispense:  90 tablet    Refill:  0    Time spent: 40 Minutes Time spent includes review of chart, medications, test results and follow-up plan with the patient.  This patient was seen by Theodoro Grist AGNP-C in Collaboration with Dr Lavera Guise as a part of collaborative care agreement     Tanna Furry. Bexlee Bergdoll AGNP-C Internal medicine

## 2020-03-31 NOTE — Telephone Encounter (Signed)
Faxed mdinr to kelly at [facility]

## 2020-04-01 ENCOUNTER — Encounter: Payer: Self-pay | Admitting: Hospice and Palliative Medicine

## 2020-04-01 ENCOUNTER — Telehealth: Payer: Self-pay

## 2020-04-01 LAB — COMPREHENSIVE METABOLIC PANEL
ALT: 14 IU/L (ref 0–32)
AST: 28 IU/L (ref 0–40)
Albumin/Globulin Ratio: 1.5 (ref 1.2–2.2)
Albumin: 4.1 g/dL (ref 3.8–4.8)
Alkaline Phosphatase: 70 IU/L (ref 44–121)
BUN/Creatinine Ratio: 31 — ABNORMAL HIGH (ref 12–28)
BUN: 18 mg/dL (ref 8–27)
Bilirubin Total: 0.5 mg/dL (ref 0.0–1.2)
CO2: 22 mmol/L (ref 20–29)
Calcium: 9.5 mg/dL (ref 8.7–10.3)
Chloride: 103 mmol/L (ref 96–106)
Creatinine, Ser: 0.59 mg/dL (ref 0.57–1.00)
Globulin, Total: 2.7 g/dL (ref 1.5–4.5)
Glucose: 113 mg/dL — ABNORMAL HIGH (ref 65–99)
Potassium: 4.4 mmol/L (ref 3.5–5.2)
Sodium: 140 mmol/L (ref 134–144)
Total Protein: 6.8 g/dL (ref 6.0–8.5)
eGFR: 97 mL/min/{1.73_m2} (ref >59)

## 2020-04-01 LAB — CBC WITH DIFFERENTIAL/PLATELET
Basophils Absolute: 0.1 10*3/uL (ref 0.0–0.2)
Basos: 1 %
EOS (ABSOLUTE): 0.2 10*3/uL (ref 0.0–0.4)
Eos: 2 %
Hematocrit: 42.5 % (ref 34.0–46.6)
Hemoglobin: 13.3 g/dL (ref 11.1–15.9)
Immature Grans (Abs): 0 10*3/uL (ref 0.0–0.1)
Immature Granulocytes: 0 %
Lymphocytes Absolute: 3.1 10*3/uL (ref 0.7–3.1)
Lymphs: 35 %
MCH: 29.6 pg (ref 26.6–33.0)
MCHC: 31.3 g/dL — ABNORMAL LOW (ref 31.5–35.7)
MCV: 94 fL (ref 79–97)
Monocytes Absolute: 0.6 10*3/uL (ref 0.1–0.9)
Monocytes: 6 %
Neutrophils Absolute: 5 10*3/uL (ref 1.4–7.0)
Neutrophils: 56 %
Platelets: 359 10*3/uL (ref 150–450)
RBC: 4.5 x10E6/uL (ref 3.77–5.28)
RDW: 14.4 % (ref 11.7–15.4)
WBC: 8.9 10*3/uL (ref 3.4–10.8)

## 2020-04-01 LAB — TSH+FREE T4
Free T4: 1.94 ng/dL — ABNORMAL HIGH (ref 0.82–1.77)
TSH: 1.85 u[IU]/mL (ref 0.450–4.500)

## 2020-04-01 LAB — LIPID PANEL WITH LDL/HDL RATIO
Cholesterol, Total: 113 mg/dL (ref 100–199)
HDL: 35 mg/dL — ABNORMAL LOW (ref >39)
LDL Chol Calc (NIH): 61 mg/dL (ref 0–99)
LDL/HDL Ratio: 1.7 ratio (ref 0.0–3.2)
Triglycerides: 83 mg/dL (ref 0–149)
VLDL Cholesterol Cal: 17 mg/dL (ref 5–40)

## 2020-04-01 NOTE — Telephone Encounter (Signed)
Faxed  [facility] for disposable underwear,desitin cream and wipes

## 2020-04-02 ENCOUNTER — Other Ambulatory Visit: Payer: Self-pay | Admitting: Hospice and Palliative Medicine

## 2020-04-02 MED ORDER — LEVOTHYROXINE SODIUM 25 MCG PO TABS: 25.0000 ug | ORAL_TABLET | Freq: Every day | ORAL | 2 refills | Status: DC

## 2020-04-02 NOTE — Progress Notes (Signed)
Please call and let her know that I have reviewed her labs and based on results I have sent in a lower dose of levothyroxine to her pharmacy. Will repeat labs at next office visit.

## 2020-04-24 ENCOUNTER — Ambulatory Visit (INDEPENDENT_AMBULATORY_CARE_PROVIDER_SITE_OTHER): Payer: Medicare Other

## 2020-04-24 DIAGNOSIS — Z7901 Long term (current) use of anticoagulants: Secondary | ICD-10-CM

## 2020-04-25 NOTE — Progress Notes (Signed)
Pt  inr 1.8 as per taylor continue same med for now no change

## 2020-04-28 ENCOUNTER — Ambulatory Visit (INDEPENDENT_AMBULATORY_CARE_PROVIDER_SITE_OTHER): Payer: Medicare Other

## 2020-04-28 DIAGNOSIS — Z7901 Long term (current) use of anticoagulants: Secondary | ICD-10-CM

## 2020-04-29 NOTE — Progress Notes (Signed)
Pt INR 2.0  As per lauren continue same med for now

## 2020-05-01 LAB — PROTIME-INR

## 2020-05-05 ENCOUNTER — Ambulatory Visit (INDEPENDENT_AMBULATORY_CARE_PROVIDER_SITE_OTHER): Payer: Medicare Other

## 2020-05-05 DIAGNOSIS — Z7901 Long term (current) use of anticoagulants: Secondary | ICD-10-CM | POA: Diagnosis not present

## 2020-05-05 NOTE — Progress Notes (Signed)
Pt INR 1.9 as per taylor continue same med for now

## 2020-05-12 ENCOUNTER — Ambulatory Visit (INDEPENDENT_AMBULATORY_CARE_PROVIDER_SITE_OTHER): Payer: Medicare Other

## 2020-05-12 DIAGNOSIS — Z7901 Long term (current) use of anticoagulants: Secondary | ICD-10-CM

## 2020-05-12 NOTE — Progress Notes (Signed)
Pt INR 1.9 per Lovena Le continue same med for now

## 2020-05-13 ENCOUNTER — Ambulatory Visit: Payer: Medicare Other | Admitting: Hospice and Palliative Medicine

## 2020-05-14 LAB — PROTIME-INR

## 2020-05-20 ENCOUNTER — Ambulatory Visit (INDEPENDENT_AMBULATORY_CARE_PROVIDER_SITE_OTHER): Payer: Medicare Other

## 2020-05-20 DIAGNOSIS — Z7901 Long term (current) use of anticoagulants: Secondary | ICD-10-CM

## 2020-05-23 NOTE — Progress Notes (Signed)
Pt INR 1.9 continue same med for now

## 2020-05-30 ENCOUNTER — Ambulatory Visit (INDEPENDENT_AMBULATORY_CARE_PROVIDER_SITE_OTHER): Payer: Medicare Other

## 2020-05-30 DIAGNOSIS — Z7901 Long term (current) use of anticoagulants: Secondary | ICD-10-CM

## 2020-05-30 NOTE — Progress Notes (Signed)
Pt INR 0.8 as per dr advised pt caregiver take 2 tab po of coumadin 1 mg every Friday and Tuesday and take 1 tab po of coumadin 1 mg daily and repeat in 1 week

## 2020-06-02 ENCOUNTER — Ambulatory Visit (INDEPENDENT_AMBULATORY_CARE_PROVIDER_SITE_OTHER): Payer: Medicare Other

## 2020-06-02 ENCOUNTER — Ambulatory Visit: Payer: Medicare Other | Admitting: Hospice and Palliative Medicine

## 2020-06-02 DIAGNOSIS — Z7901 Long term (current) use of anticoagulants: Secondary | ICD-10-CM | POA: Diagnosis not present

## 2020-06-02 NOTE — Progress Notes (Signed)
Pt INR 1.3 as per taylor continue our direction given on Friday for coumadin and recheck on friday

## 2020-06-09 ENCOUNTER — Ambulatory Visit (INDEPENDENT_AMBULATORY_CARE_PROVIDER_SITE_OTHER): Payer: Medicare Other

## 2020-06-09 DIAGNOSIS — Z7901 Long term (current) use of anticoagulants: Secondary | ICD-10-CM | POA: Diagnosis not present

## 2020-06-09 NOTE — Progress Notes (Signed)
Pt INR 2.0 and as per lauren continue same coumadin 1 mg daily and take 2 tab of coumadin 1 mg every Friday and  Tuesday

## 2020-06-12 LAB — PROTIME-INR

## 2020-06-13 ENCOUNTER — Ambulatory Visit (INDEPENDENT_AMBULATORY_CARE_PROVIDER_SITE_OTHER): Payer: Medicare Other

## 2020-06-13 DIAGNOSIS — Z7901 Long term (current) use of anticoagulants: Secondary | ICD-10-CM

## 2020-06-17 LAB — PROTIME-INR

## 2020-06-18 NOTE — Progress Notes (Signed)
Pt INR 2.4 as per dr Humphrey Rolls is no change for medication now continue same med as prescribe

## 2020-06-20 ENCOUNTER — Ambulatory Visit (INDEPENDENT_AMBULATORY_CARE_PROVIDER_SITE_OTHER): Payer: Medicare Other

## 2020-06-20 DIAGNOSIS — Z7901 Long term (current) use of anticoagulants: Secondary | ICD-10-CM

## 2020-06-20 LAB — POCT INR: INR: 2.9 (ref 2.0–3.0)

## 2020-06-20 NOTE — Progress Notes (Signed)
Home PT/INR was 2.9 today.  Per DFK pt is to continue 1 mg coumadin daily and repeat in 1 week.  Confirmed information with caregiver.

## 2020-06-24 ENCOUNTER — Telehealth: Payer: Self-pay

## 2020-06-24 NOTE — Telephone Encounter (Signed)
Caregiver cassandra called advising that pt's left arm was a little red and swollen, mentioned that pt does pick at her arm at times.  I asked caregiver to send pictures of pt's arms so we could review the redness and swelling.  I informed DFK and she asked for pt to come in for appt on 06/25/20 to be checked.  Per caregiver, she doesn't drive and pt isn't able to drive so it is hard for her to get to office for appt, but said maybe next week she can come in.  I informed caregiver that if she feels that the redness and swelling gets worse that she needs to call 911.  I informed caregiver to place cold compress on arm and elevate arm some to see if it helps her issues.

## 2020-06-27 ENCOUNTER — Ambulatory Visit (INDEPENDENT_AMBULATORY_CARE_PROVIDER_SITE_OTHER): Payer: Medicare Other | Admitting: Physician Assistant

## 2020-06-27 DIAGNOSIS — Z7901 Long term (current) use of anticoagulants: Secondary | ICD-10-CM

## 2020-06-27 NOTE — Progress Notes (Signed)
Pt INR was 2.8 today and per Lauren pt is to continue 1 mg warfarin daily and repeat in 1 week

## 2020-06-28 ENCOUNTER — Other Ambulatory Visit: Payer: Self-pay

## 2020-06-28 DIAGNOSIS — I480 Paroxysmal atrial fibrillation: Secondary | ICD-10-CM

## 2020-06-28 MED ORDER — WARFARIN SODIUM 1 MG PO TABS: 1.0000 mg | ORAL_TABLET | Freq: Every day | ORAL | 1 refills | Status: DC

## 2020-07-01 LAB — PROTIME-INR

## 2020-07-04 ENCOUNTER — Ambulatory Visit (INDEPENDENT_AMBULATORY_CARE_PROVIDER_SITE_OTHER): Payer: Medicare Other

## 2020-07-04 DIAGNOSIS — Z7901 Long term (current) use of anticoagulants: Secondary | ICD-10-CM

## 2020-07-04 NOTE — Progress Notes (Signed)
Pt INR 1.9 as per lauren no change for now continue same med

## 2020-07-08 LAB — PROTIME-INR

## 2020-07-11 ENCOUNTER — Ambulatory Visit (INDEPENDENT_AMBULATORY_CARE_PROVIDER_SITE_OTHER): Payer: Medicare Other

## 2020-07-11 DIAGNOSIS — Z7901 Long term (current) use of anticoagulants: Secondary | ICD-10-CM | POA: Diagnosis not present

## 2020-07-11 NOTE — Progress Notes (Signed)
Pt INR was  3.2 pt is also taking 1 mg of warfarin daily and on Tuesday and fridays she takes 2mg  , per provider advised pt caregiver pt is to skip one dose on Saturday and resume on Sunday  she already  taken her dose for today, also advised to take med on supper time

## 2020-07-15 LAB — PROTIME-INR

## 2020-07-18 ENCOUNTER — Ambulatory Visit (INDEPENDENT_AMBULATORY_CARE_PROVIDER_SITE_OTHER): Payer: Medicare Other

## 2020-07-18 DIAGNOSIS — Z7901 Long term (current) use of anticoagulants: Secondary | ICD-10-CM

## 2020-07-18 NOTE — Progress Notes (Signed)
PT  INR 2.3 as per DFK no change continue tale samemed as prescribed

## 2020-07-25 ENCOUNTER — Ambulatory Visit (INDEPENDENT_AMBULATORY_CARE_PROVIDER_SITE_OTHER): Payer: Medicare Other

## 2020-07-25 DIAGNOSIS — Z79899 Other long term (current) drug therapy: Secondary | ICD-10-CM

## 2020-07-25 NOTE — Progress Notes (Signed)
Pt's INR today was 3.1 and she takes 1 mg coumadin daily.  Per DFK pt is to resume normal dose of 1 mg coumadin and repeat INR in 1 week

## 2020-07-29 ENCOUNTER — Other Ambulatory Visit: Payer: Self-pay

## 2020-07-29 DIAGNOSIS — E782 Mixed hyperlipidemia: Secondary | ICD-10-CM

## 2020-07-29 LAB — PROTIME-INR

## 2020-07-29 MED ORDER — FENOFIBRATE 145 MG PO TABS: 145.0000 mg | ORAL_TABLET | Freq: Every day | ORAL | 3 refills | Status: DC

## 2020-07-29 MED ORDER — ATORVASTATIN CALCIUM 40 MG PO TABS: 40.0000 mg | ORAL_TABLET | Freq: Every day | ORAL | 3 refills | Status: DC

## 2020-08-03 DIAGNOSIS — Z7901 Long term (current) use of anticoagulants: Secondary | ICD-10-CM | POA: Diagnosis not present

## 2020-08-05 ENCOUNTER — Ambulatory Visit (INDEPENDENT_AMBULATORY_CARE_PROVIDER_SITE_OTHER): Payer: Medicare Other

## 2020-08-05 DIAGNOSIS — Z7901 Long term (current) use of anticoagulants: Secondary | ICD-10-CM | POA: Diagnosis not present

## 2020-08-05 NOTE — Progress Notes (Signed)
Pt INR 3.2 as per DFK continue same med

## 2020-08-12 ENCOUNTER — Ambulatory Visit (INDEPENDENT_AMBULATORY_CARE_PROVIDER_SITE_OTHER): Payer: Medicare Other

## 2020-08-12 DIAGNOSIS — Z7901 Long term (current) use of anticoagulants: Secondary | ICD-10-CM

## 2020-08-12 NOTE — Progress Notes (Signed)
Pt INR 2.7 as per DFK no change continue same

## 2020-08-15 LAB — PROTIME-INR

## 2020-08-21 LAB — PROTIME-INR

## 2020-08-22 ENCOUNTER — Telehealth: Payer: Self-pay

## 2020-08-22 ENCOUNTER — Ambulatory Visit (INDEPENDENT_AMBULATORY_CARE_PROVIDER_SITE_OTHER): Payer: Medicare Other

## 2020-08-22 DIAGNOSIS — Z79899 Other long term (current) drug therapy: Secondary | ICD-10-CM

## 2020-08-22 NOTE — Telephone Encounter (Signed)
error 

## 2020-08-22 NOTE — Progress Notes (Signed)
Pt's INR was 3.4 today.  Per DFK pt is to hold one 1 mg tablet for one day per week and continue normal dosing other days.  Caregiver was notified.

## 2020-08-29 LAB — PROTIME-INR

## 2020-09-01 ENCOUNTER — Ambulatory Visit (INDEPENDENT_AMBULATORY_CARE_PROVIDER_SITE_OTHER): Payer: Medicare Other

## 2020-09-01 DIAGNOSIS — Z7901 Long term (current) use of anticoagulants: Secondary | ICD-10-CM | POA: Diagnosis not present

## 2020-09-01 NOTE — Progress Notes (Signed)
Pt INR 3.2 as per dr Humphrey Rolls no change for this time continue same med as prescribed and lmom to caregiver to call us back

## 2020-09-12 ENCOUNTER — Ambulatory Visit (INDEPENDENT_AMBULATORY_CARE_PROVIDER_SITE_OTHER): Payer: Medicare Other

## 2020-09-12 ENCOUNTER — Telehealth: Payer: Self-pay

## 2020-09-12 ENCOUNTER — Other Ambulatory Visit: Payer: Self-pay

## 2020-09-12 DIAGNOSIS — Z7901 Long term (current) use of anticoagulants: Secondary | ICD-10-CM

## 2020-09-12 NOTE — Progress Notes (Signed)
Pt INR 1.8, spoke with Ander Purpura, pt is to take 1 mg of warfarin everyday and take 2 on Tuesday and Friday. Caregiver repeated instructions back.

## 2020-09-12 NOTE — Telephone Encounter (Signed)
Pt INR 1.8, spoke with Ander Purpura, pt is to take 1 mg of warfarin everyday and take 2 on Tuesday and Friday. Caregiver repeated instructions back.

## 2020-09-15 ENCOUNTER — Other Ambulatory Visit: Payer: Self-pay

## 2020-09-15 ENCOUNTER — Encounter: Payer: Self-pay | Admitting: Physician Assistant

## 2020-09-15 ENCOUNTER — Ambulatory Visit (INDEPENDENT_AMBULATORY_CARE_PROVIDER_SITE_OTHER): Payer: Medicare Other | Admitting: Physician Assistant

## 2020-09-15 DIAGNOSIS — E039 Hypothyroidism, unspecified: Secondary | ICD-10-CM

## 2020-09-15 DIAGNOSIS — I480 Paroxysmal atrial fibrillation: Secondary | ICD-10-CM | POA: Diagnosis not present

## 2020-09-15 DIAGNOSIS — Z0001 Encounter for general adult medical examination with abnormal findings: Secondary | ICD-10-CM | POA: Diagnosis not present

## 2020-09-15 DIAGNOSIS — J439 Emphysema, unspecified: Secondary | ICD-10-CM | POA: Diagnosis not present

## 2020-09-15 DIAGNOSIS — E782 Mixed hyperlipidemia: Secondary | ICD-10-CM | POA: Diagnosis not present

## 2020-09-15 NOTE — Progress Notes (Signed)
[facility] [place], [place] [address]  Internal MEDICINE  Office Visit Note  Patient Name: Teresa Holmes  [id number]  [id number]  Date of Service: 09/21/2020  Chief Complaint  Patient presents with   Medicare Wellness   Rash    On buttock, redness, no open sore, uses Desitin, itching    Quality Metric Gaps    Hep C screen, Tdap, PNA, Mammogram, Flu vaccine, pt had 3 covid vaccines     HPI Pt is here for routine health maintenance examination with care giver -BP at home 130/60 -Using Desitin cream for some redness on buttock, changes position frequently to avoid any open wounds due to being bed/WC bound -she is bed ridden and it is difficult to transfer, would be easier to do phone visit in future as able -COPD stable, but she is wheezing a little today. Normally gets two treatments but they were in a rush today and did not have treatment prior visit. Normally well controlled -Smoking 5-6 cigs per day -Needs updated thyroid labs since reduced dose after elevation on prior labs -Overdue for mammogram and updated vaccines but declines at this time  Current Medication: Outpatient Encounter Medications as of 09/15/2020  Medication Sig   atorvastatin (LIPITOR) 40 MG tablet Take 1 tablet (40 mg total) by mouth daily.   fenofibrate (TRICOR) 145 MG tablet Take 1 tablet (145 mg total) by mouth daily.   furosemide (LASIX) 20 MG tablet 20 MG FOR 3 DAYS THEN ONE EVERY OTHER DAY   HYDROcodone-acetaminophen (NORCO/VICODIN) 5-325 MG tablet Take 1-2 tablets by mouth every 6 (six) hours as needed for moderate pain.   levothyroxine (SYNTHROID) 25 MCG tablet Take 1 tablet (25 mcg total) by mouth daily before breakfast.   metoprolol tartrate (LOPRESSOR) 25 MG tablet Take 0.5 tablets (12.5 mg total) by mouth 2 (two) times daily.   polyethylene glycol (MIRALAX / GLYCOLAX) 17 g packet Take 17 g by mouth daily.   warfarin (COUMADIN) 1 MG tablet Take 1 tablet (1 mg total)  by mouth daily.   No facility-administered encounter medications on file as of 09/15/2020.    Surgical History: Past Surgical History:  Procedure Laterality Date   ABDOMINAL HYSTERECTOMY     AORTIC/RENAL BYPASS     bypass graft     CHOLECYSTECTOMY     CORONARY ARTERY BYPASS GRAFT     HIP ARTHROPLASTY Left 11/11/2019   Procedure: ARTHROPLASTY BIPOLAR HIP (HEMIARTHROPLASTY);  Surgeon: Corky Mull, MD;  Location: [facility] ORS;  Service: Orthopedics;  Laterality: Left;    Medical History: Past Medical History:  Diagnosis Date   Aneurysm ([place])    Anxiety    Arthritis    Cancer ([place])    CHF (congestive heart failure) ([place])    COPD (chronic obstructive pulmonary disease) ([place])    Hyperlipidemia    Hypertension    Stroke ([place])    Thyroid disease     Family History: Family History  Problem Relation Age of Onset   Cancer Mother       Review of Systems  Constitutional:  Negative for chills, diaphoresis, fatigue and unexpected weight change.  HENT:  Positive for postnasal drip. Negative for congestion, ear pain, rhinorrhea, sinus pressure, sneezing and sore throat.   Eyes:  Negative for photophobia, discharge, redness, itching and visual disturbance.  Respiratory:  Negative for cough, chest tightness, shortness of breath and wheezing.   Cardiovascular:  Negative for chest pain, palpitations and leg swelling.  Gastrointestinal:  Negative for  abdominal pain, constipation, diarrhea, nausea and vomiting.  Genitourinary:  Negative for dysuria, flank pain and frequency.  Musculoskeletal:  Positive for gait problem, myalgias and neck stiffness. Negative for arthralgias, back pain, joint swelling and neck pain.  Skin:  Negative for color change and rash.  Allergic/Immunologic: Negative for environmental allergies and food allergies.  Neurological:  Positive for weakness. Negative for dizziness, tremors, numbness and headaches.       Left sided paralysis due to history of CVA   Hematological:  Negative for adenopathy. Does not bruise/bleed easily.  Psychiatric/Behavioral:  Negative for agitation, behavioral problems (Depression), hallucinations, sleep disturbance and suicidal ideas. The patient is not nervous/anxious.     Vital Signs: BP (!) 142/70 Comment: 148/70  Pulse 73   Temp 98.3 F (36.8 C)   Resp 16   Ht '4\' 7"'$  (1.397 m)   Wt 89 lb (40.4 kg)   SpO2 96%   BMI 20.69 kg/m    Physical Exam Vitals and nursing note reviewed.  Constitutional:      General: She is not in acute distress.    Appearance: She is well-developed and normal weight. She is not diaphoretic.  HENT:     Head: Normocephalic and atraumatic.     Right Ear: External ear normal.     Left Ear: External ear normal.     Nose: Nose normal.     Mouth/Throat:     Pharynx: No oropharyngeal exudate.  Eyes:     General: No scleral icterus.       Right eye: No discharge.        Left eye: No discharge.     Conjunctiva/sclera: Conjunctivae normal.     Pupils: Pupils are equal, round, and reactive to light.  Neck:     Thyroid: No thyromegaly.     Vascular: No JVD.     Trachea: No tracheal deviation.  Cardiovascular:     Rate and Rhythm: Normal rate and regular rhythm.     Heart sounds: Normal heart sounds. No murmur heard.   No friction rub. No gallop.  Pulmonary:     Effort: Pulmonary effort is normal. No respiratory distress.     Breath sounds: Normal breath sounds. No stridor. No wheezing or rales.  Chest:     Chest wall: No tenderness.  Abdominal:     General: Bowel sounds are normal. There is no distension.     Palpations: Abdomen is soft. There is no mass.     Tenderness: There is no abdominal tenderness. There is no guarding or rebound.  Musculoskeletal:        General: No tenderness or deformity. Normal range of motion.     Cervical back: Normal range of motion and neck supple.  Lymphadenopathy:     Cervical: No cervical adenopathy.  Skin:    General: Skin is warm and  dry.     Coloration: Skin is not pale.     Findings: No erythema or rash.  Neurological:     Mental Status: She is alert.     Cranial Nerves: No cranial nerve deficit.     Motor: Weakness present. No abnormal muscle tone.     Coordination: Coordination normal.     Gait: Gait abnormal.     Deep Tendon Reflexes: Reflexes are normal and symmetric.     Comments: WC bound  Psychiatric:        Behavior: Behavior normal.        Thought Content: Thought content normal.  Judgment: Judgment normal.     LABS: Recent Results (from the past 2160 hour(s))  Protime-INR     Status: None   Collection Time: 06/26/20 12:00 AM  Result Value Ref Range   INR    Protime-INR     Status: None   Collection Time: 07/04/20 12:00 AM  Result Value Ref Range   INR    Protime-INR     Status: None   Collection Time: 07/11/20 12:00 AM  Result Value Ref Range   INR    Protime-INR     Status: None   Collection Time: 07/18/20 12:00 AM  Result Value Ref Range   INR    Protime-INR     Status: None   Collection Time: 07/25/20 12:00 AM  Result Value Ref Range   INR    Protime-INR     Status: None   Collection Time: 08/03/20 12:00 AM  Result Value Ref Range   INR    Protime-INR     Status: None   Collection Time: 08/12/20 12:00 AM  Result Value Ref Range   INR    Protime-INR     Status: None   Collection Time: 08/22/20 12:00 AM  Result Value Ref Range   INR    Protime-INR     Status: None   Collection Time: 08/31/20 12:00 AM  Result Value Ref Range   INR    Protime-INR     Status: None   Collection Time: 09/12/20 12:00 AM  Result Value Ref Range   INR          Assessment/Plan: 1. Encounter for general adult medical examination with abnormal findings CPE performed, will update thyroid labs  2. Acquired hypothyroidism Will update thyroid labs since decreased dose after last draw - TSH + free T4  3. Mixed hyperlipidemia Continue current medications  4. Pulmonary emphysema,  unspecified emphysema type ([place]) Continuee breathing treatments as indicated  5. Paroxysmal atrial fibrillation (HCC) Stable, continue warfarin   General Counseling: Joletta verbalizes understanding of the findings of todays visit and agrees with plan of treatment. I have discussed any further diagnostic evaluation that may be needed or ordered today. We also reviewed her medications today. she has been encouraged to call the office with any questions or concerns that should arise related to todays visit.    Counseling:    Orders Placed This Encounter  Procedures   TSH + free T4    No orders of the defined types were placed in this encounter.   This patient was seen by Drema Dallas, PA-C in collaboration with Dr. Clayborn Bigness as a part of collaborative care agreement.  Total time spent:35 Minutes  Time spent includes review of chart, medications, test results, and follow up plan with the patient.     Lavera Guise, MD  Internal Medicine

## 2020-09-16 LAB — PROTIME-INR

## 2020-09-19 ENCOUNTER — Ambulatory Visit (INDEPENDENT_AMBULATORY_CARE_PROVIDER_SITE_OTHER): Payer: Medicare Other

## 2020-09-19 DIAGNOSIS — Z7901 Long term (current) use of anticoagulants: Secondary | ICD-10-CM

## 2020-09-19 NOTE — Progress Notes (Signed)
Pt INR 2.4 as per lauren pt caregiver advised that continue same med take 1 tab po coumadin 1 mg daily except Tuesday and Friday she can take coumadin 2 mg

## 2020-09-23 ENCOUNTER — Telehealth: Payer: Self-pay

## 2020-09-23 ENCOUNTER — Other Ambulatory Visit: Payer: Self-pay | Admitting: Physician Assistant

## 2020-09-23 LAB — PROTIME-INR

## 2020-09-25 ENCOUNTER — Other Ambulatory Visit: Payer: Self-pay | Admitting: Internal Medicine

## 2020-09-25 ENCOUNTER — Other Ambulatory Visit: Payer: Self-pay

## 2020-09-25 ENCOUNTER — Telehealth: Payer: Self-pay

## 2020-09-25 MED ORDER — DUODERM HYDROACTIVE EX GEL: CUTANEOUS | 0 refills | Status: DC

## 2020-09-25 NOTE — Telephone Encounter (Signed)
Called LMOM that we sent duoderm to the pharmacy for the wound pt has.

## 2020-09-25 NOTE — Telephone Encounter (Signed)
I sent rx, it is OTC

## 2020-09-26 ENCOUNTER — Ambulatory Visit (INDEPENDENT_AMBULATORY_CARE_PROVIDER_SITE_OTHER): Payer: Medicare Other

## 2020-09-26 DIAGNOSIS — Z7901 Long term (current) use of anticoagulants: Secondary | ICD-10-CM

## 2020-09-26 NOTE — Progress Notes (Signed)
Pt INR 2.4 as per lauren continue same med as prescribed

## 2020-09-28 NOTE — Telephone Encounter (Signed)
Med sent to pharmacy.

## 2020-09-30 LAB — PROTIME-INR

## 2020-10-05 ENCOUNTER — Emergency Department: Payer: Medicare Other

## 2020-10-05 ENCOUNTER — Other Ambulatory Visit: Payer: Self-pay

## 2020-10-05 ENCOUNTER — Emergency Department
Admission: EM | Admit: 2020-10-05 | Discharge: 2020-10-05 | Disposition: A | Discharge status: Home / Self Care | Payer: Medicare Other | Attending: Emergency Medicine | Admitting: Emergency Medicine

## 2020-10-05 DIAGNOSIS — J449 Chronic obstructive pulmonary disease, unspecified: Secondary | ICD-10-CM | POA: Diagnosis not present

## 2020-10-05 DIAGNOSIS — T148XXA Other injury of unspecified body region, initial encounter: Secondary | ICD-10-CM

## 2020-10-05 DIAGNOSIS — Z7901 Long term (current) use of anticoagulants: Secondary | ICD-10-CM | POA: Diagnosis not present

## 2020-10-05 DIAGNOSIS — E039 Hypothyroidism, unspecified: Secondary | ICD-10-CM | POA: Diagnosis not present

## 2020-10-05 DIAGNOSIS — S7011XA Contusion of right thigh, initial encounter: Secondary | ICD-10-CM | POA: Diagnosis not present

## 2020-10-05 DIAGNOSIS — Z96642 Presence of left artificial hip joint: Secondary | ICD-10-CM | POA: Diagnosis not present

## 2020-10-05 DIAGNOSIS — I110 Hypertensive heart disease with heart failure: Secondary | ICD-10-CM | POA: Diagnosis not present

## 2020-10-05 DIAGNOSIS — I509 Heart failure, unspecified: Secondary | ICD-10-CM | POA: Insufficient documentation

## 2020-10-05 DIAGNOSIS — Z79899 Other long term (current) drug therapy: Secondary | ICD-10-CM | POA: Insufficient documentation

## 2020-10-05 DIAGNOSIS — M79604 Pain in right leg: Secondary | ICD-10-CM | POA: Diagnosis not present

## 2020-10-05 DIAGNOSIS — X58XXXA Exposure to other specified factors, initial encounter: Secondary | ICD-10-CM | POA: Insufficient documentation

## 2020-10-05 DIAGNOSIS — I10 Essential (primary) hypertension: Secondary | ICD-10-CM | POA: Diagnosis not present

## 2020-10-05 DIAGNOSIS — Z951 Presence of aortocoronary bypass graft: Secondary | ICD-10-CM | POA: Diagnosis not present

## 2020-10-05 DIAGNOSIS — R0902 Hypoxemia: Secondary | ICD-10-CM | POA: Diagnosis not present

## 2020-10-05 DIAGNOSIS — F17210 Nicotine dependence, cigarettes, uncomplicated: Secondary | ICD-10-CM | POA: Diagnosis not present

## 2020-10-05 DIAGNOSIS — Z859 Personal history of malignant neoplasm, unspecified: Secondary | ICD-10-CM | POA: Diagnosis not present

## 2020-10-05 DIAGNOSIS — S8011XA Contusion of right lower leg, initial encounter: Secondary | ICD-10-CM | POA: Diagnosis not present

## 2020-10-05 LAB — PROTIME-INR
INR: 2.3 — ABNORMAL HIGH (ref 0.8–1.2)
Prothrombin Time: 25.1 seconds — ABNORMAL HIGH (ref 11.4–15.2)

## 2020-10-05 NOTE — ED Triage Notes (Signed)
Pt arrives via EMS from home d/t bruising that started on the back of her R leg- pt has limited mobility d/t previous strokes- pt denies any injury- pt does take warfarin

## 2020-10-05 NOTE — Discharge Instructions (Addendum)
Your exam and ultrasound are normal at this time.  No notation of any blood clots or fluid collections in the area of concern.  The bruise likely represents side effect of your Coumadin therapy.  Your INR is therapeutic at 2.3.  Follow with primary provider for any further management of your complaints.

## 2020-10-05 NOTE — ED Triage Notes (Signed)
Pt comes ems from home with new bruising to right leg. Pt is on warfarin. Denies pain. VSS with some HTN. Pt has COPD and is 90% on RA.   [phone] [name], POA and ride home

## 2020-10-06 NOTE — ED Provider Notes (Signed)
[facility] Emergency Department Provider Note ____________________________________________  Time seen: [id number]  I have reviewed the triage vital signs and the nursing notes.  HISTORY  Chief Complaint  bruise   HPI Teresa Holmes is a 70 y.o. female with the below history including CVA and stroke and hemiplegia and hemiparesis, who is on long-term anticoagulation therapy, presents to the ED for bruise noted to the posterior right leg.  Patient who does not ambulate independently, was told by her caregiver, that she had a large bruise to the posterior thigh.  Patient unaware of any known direct trauma or injury to the leg.  Denies any frank pain to the leg, chest pain, or shortness of breath.  Patient arrives via EMS from home, for evaluation of large area of bruising to the posterior right leg.  Patient does take warfarin daily.  Past Medical History:  Diagnosis Date   Aneurysm ([place])    Anxiety    Arthritis    Cancer ([place])    CHF (congestive heart failure) ([place])    COPD (chronic obstructive pulmonary disease) ([place])    Hyperlipidemia    Hypertension    Stroke [facility])    Thyroid disease     Patient Active Problem List   Diagnosis Date Noted   Hip fracture ([place]) 11/10/2019   Closed rib fracture 11/10/2019   Acute blood loss anemia 11/10/2019   AF (paroxysmal atrial fibrillation) ([place]) 11/10/2019   CVA, old, hemiparesis ([place]) 11/10/2019   Wound of skin 11/10/2019   Pulmonary emphysema ([place]) 10/23/2019   Pelvic fracture ([place]) 09/28/2018   Encounter for long-term (current) use of medications 05/11/2017   Chronic pain disorder 05/11/2017   Generalized anxiety disorder 03/14/2017   Essential (primary) hypertension 03/14/2017   Hemiplegia and hemiparesis following cerebral infarction affecting left non-dominant side ([place]) 03/14/2017   Vitamin D deficiency, unspecified 03/14/2017   Personal history of venous thrombosis and embolism 03/14/2017   Congenital  spondylolisthesis 03/14/2017   Nicotine dependence, cigarettes, uncomplicated [id number]   Mixed hyperlipidemia 03/14/2017   Long term (current) use of anticoagulants 03/14/2017   Other intervertebral disc degeneration, thoracolumbar region 03/14/2017   Lumbago with sciatica 03/14/2017   Cardiac arrhythmia 03/14/2017   Myalgia 03/14/2017   Hypothyroidism 03/14/2017   Difficulty walking 03/14/2017   Left knee pain 03/14/2017    Past Surgical History:  Procedure Laterality Date   ABDOMINAL HYSTERECTOMY     AORTIC/RENAL BYPASS     bypass graft     CHOLECYSTECTOMY     CORONARY ARTERY BYPASS GRAFT     HIP ARTHROPLASTY Left 11/11/2019   Procedure: ARTHROPLASTY BIPOLAR HIP (HEMIARTHROPLASTY);  Surgeon: Corky Mull, MD;  Location: [facility] ORS;  Service: Orthopedics;  Laterality: Left;    Prior to Admission medications   Medication Sig Start Date End Date Taking? Authorizing Provider  atorvastatin (LIPITOR) 40 MG tablet Take 1 tablet (40 mg total) by mouth daily. 07/29/20   Lavera Guise, MD  fenofibrate (TRICOR) 145 MG tablet Take 1 tablet (145 mg total) by mouth daily. 07/29/20   Lavera Guise, MD  furosemide (LASIX) 20 MG tablet 20 MG FOR 3 DAYS THEN ONE EVERY OTHER DAY 03/31/20   Luiz Ochoa, NP  Hydroactive Dressings (DUODERM HYDROACTIVE) GEL Apply to the affected area once a day. Must cover with gauze 09/25/20   Lavera Guise, MD  HYDROcodone-acetaminophen (NORCO/VICODIN) 5-325 MG tablet Take 1-2 tablets by mouth every 6 (six) hours as needed for moderate pain. 03/31/20   Kenton Kingfisher,  Tanna Furry, NP  levothyroxine (SYNTHROID) 25 MCG tablet Take 1 tablet (25 mcg total) by mouth daily before breakfast. 04/02/20   Luiz Ochoa, NP  metoprolol tartrate (LOPRESSOR) 25 MG tablet Take 0.5 tablets (12.5 mg total) by mouth 2 (two) times daily. 03/31/20   Luiz Ochoa, NP  polyethylene glycol (MIRALAX / GLYCOLAX) 17 g packet Take 17 g by mouth daily. 11/14/19   Shelly Coss, MD  warfarin  (COUMADIN) 1 MG tablet Take 1 tablet (1 mg total) by mouth daily. 06/28/20   Lavera Guise, MD    Allergies Aspirin and Sulfa antibiotics  Family History  Problem Relation Age of Onset   Cancer Mother     Social History Social History   Tobacco Use   Smoking status: Every Day    Types: Cigarettes   Smokeless tobacco: Never  Vaping Use   Vaping Use: Never used  Substance Use Topics   Alcohol use: No   Drug use: Never    Review of Systems  Constitutional: Negative for fever. Eyes: Negative for visual changes. ENT: Negative for sore throat. Cardiovascular: Negative for chest pain. Respiratory: Negative for shortness of breath. Gastrointestinal: Negative for abdominal pain, vomiting and diarrhea. Genitourinary: Negative for dysuria. Musculoskeletal: Negative for back pain. Skin: Negative for rash.  Bruising as above. Neurological: Negative for headaches, focal weakness or numbness. ____________________________________________  PHYSICAL EXAM:  VITAL SIGNS: ED Triage Vitals  Enc Vitals Group     BP 10/05/20 1226 (!) 146/96     Pulse Rate 10/05/20 1226 75     Resp 10/05/20 1226 20     Temp 10/05/20 1226 99.8 F (37.7 C)     Temp Source 10/05/20 1226 Oral     SpO2 10/05/20 1226 97 %     Weight 10/05/20 1222 91 lb (41.3 kg)     Height 10/05/20 1222 '4\' 7"'$  (1.397 m)     Head Circumference --      Peak Flow --      Pain Score 10/05/20 1222 0     Pain Loc --      Pain Edu? --      Excl. in [place]? --     Constitutional: Alert and oriented. Well appearing and in no distress. Head: Normocephalic and atraumatic. Eyes: Conjunctivae are normal. Normal extraocular movements Cardiovascular: Normal rate, regular rhythm. Normal distal pulses. Respiratory: Normal respiratory effort. No wheezes/rales/rhonchi. Gastrointestinal: Soft and nontender. No distention. Musculoskeletal: Nontender with normal range of motion in all extremities.  Posterior right thigh with a large area  from the popliteal space to the buttocks, with ecchymosis.  No overlying abrasion or laceration is appreciated.  Skin is otherwise intact. Neurologic:  Normal gait without ataxia. Normal speech and language. No gross focal neurologic deficits are appreciated. Skin:  Skin is warm, dry and intact. No rash noted. Psychiatric: Mood and affect are normal. Patient exhibits appropriate insight and judgment. ____________________________________________    {LABS (pertinent positives/negatives)  Labs Reviewed  PROTIME-INR - Abnormal; Notable for the following components:      Result Value   Prothrombin Time 25.1 (*)    INR 2.3 (*)    All other components within normal limits  ____________________________________________  {EKG  ____________________________________________   RADIOLOGY Official radiology report(s): [place] RT LOWER EXTREM LTD SOFT TISSUE NON VASCULAR  Result Date: 10/05/2020 CLINICAL DATA:  Bruise EXAM: ULTRASOUND right LOWER EXTREMITY LIMITED TECHNIQUE: Ultrasound examination of the lower extremity soft tissues was performed in the area of clinical concern. COMPARISON:  None. FINDINGS: Within the area of concern, there is subcutaneous soft tissue swelling. There is no focal fluid collection. There is no cystic or solid mass. IMPRESSION: Subcutaneous soft tissue swelling underlying the area of concern along the right thigh. No focal fluid collection. Electronically Signed   By: Maurine Simmering M.D.   On: 10/05/2020 15:30   ____________________________________________  PROCEDURES   Procedures ____________________________________________   INITIAL IMPRESSION / ASSESSMENT AND PLAN / ED COURSE  As part of my medical decision making, I reviewed the following data within the [place] reviewed WNL, Radiograph reviewed NAD, and Notes from prior ED visits     DDX: ecchymosis, contusion, hematoma, DVT  Patient who is primarily immobile secondary to hemiparesis and  hemiplegia, presents with a large bruise to the posterior right thigh with no obvious injury on anticoagulant therapy.  Exam is overall benign reassurance patient has no acute pain to the area and range of motion to the RLE is intact at baseline.  Patient's ultrasound is negative for any acute cystic formation, hematoma, or other area of concerning bleeding.  PT/INR, is therapeutic at 2.3.  Patient is reassured of her normal work-up at this time, and is advised that symptoms likely represent idiopathic bruising secondary to her anticoagulant therapy.  She will continue monitor the area for improvement, apply warm compresses to help reduce symptoms patient follow with primary provider return to the ED if necessary.     Teresa Holmes was evaluated in Emergency Department on 10/06/2020 for the symptoms described in the history of present illness. She was evaluated in the context of the global COVID-19 pandemic, which necessitated consideration that the patient might be at risk for infection with the SARS-CoV-2 virus that causes COVID-19. Institutional protocols and algorithms that pertain to the evaluation of patients at risk for COVID-19 are in a state of rapid change based on information released by regulatory bodies including the CDC and federal and state organizations. These policies and algorithms were followed during the patient's care in the ED. ____________________________________________  FINAL CLINICAL IMPRESSION(S) / ED DIAGNOSES  Final diagnoses:  Lynne Logan, PA-C 10/06/20 1345    Blake Divine, MD 10/06/20 1625

## 2020-10-07 ENCOUNTER — Telehealth: Payer: Self-pay

## 2020-10-07 NOTE — Telephone Encounter (Signed)
Spoke with caregiver to schedule ED f/u appointment. She will call me back to schedule. She needs to discuss with driver who is also employed with [employer] as to when she can bring her-[name]

## 2020-10-08 ENCOUNTER — Ambulatory Visit (INDEPENDENT_AMBULATORY_CARE_PROVIDER_SITE_OTHER): Payer: Medicare Other

## 2020-10-08 DIAGNOSIS — Z7901 Long term (current) use of anticoagulants: Secondary | ICD-10-CM | POA: Diagnosis not present

## 2020-10-08 NOTE — Progress Notes (Signed)
Pt INR 2.4 as per DFK  no change for continue same prescribed coumadin 1 mg daily except Tuesday and Friday she can take coumadin 2 mg

## 2020-10-13 ENCOUNTER — Ambulatory Visit (INDEPENDENT_AMBULATORY_CARE_PROVIDER_SITE_OTHER): Payer: Medicare Other

## 2020-10-13 DIAGNOSIS — Z79899 Other long term (current) drug therapy: Secondary | ICD-10-CM | POA: Diagnosis not present

## 2020-10-13 DIAGNOSIS — Z7901 Long term (current) use of anticoagulants: Secondary | ICD-10-CM | POA: Diagnosis not present

## 2020-10-13 DIAGNOSIS — I4891 Unspecified atrial fibrillation: Secondary | ICD-10-CM

## 2020-10-13 NOTE — Progress Notes (Signed)
Pt had MDINR at 2.7, per DFK pt is to continue warfarin 1 mg daily.  No changes

## 2020-10-14 LAB — PROTIME-INR

## 2020-10-15 ENCOUNTER — Ambulatory Visit: Payer: Medicare Other | Admitting: Nurse Practitioner

## 2020-10-20 ENCOUNTER — Ambulatory Visit (INDEPENDENT_AMBULATORY_CARE_PROVIDER_SITE_OTHER): Payer: Medicare Other

## 2020-10-20 ENCOUNTER — Telehealth: Payer: Self-pay

## 2020-10-20 ENCOUNTER — Other Ambulatory Visit: Payer: Self-pay

## 2020-10-20 DIAGNOSIS — Z79899 Other long term (current) drug therapy: Secondary | ICD-10-CM

## 2020-10-20 DIAGNOSIS — I480 Paroxysmal atrial fibrillation: Secondary | ICD-10-CM

## 2020-10-20 MED ORDER — METOPROLOL TARTRATE 25 MG PO TABS: 12.5000 mg | ORAL_TABLET | Freq: Two times a day (BID) | ORAL | 2 refills | Status: DC

## 2020-10-20 MED ORDER — FUROSEMIDE 20 MG PO TABS: ORAL_TABLET | ORAL | 1 refills | Status: DC

## 2020-10-20 NOTE — Progress Notes (Signed)
PT/INR today was 4.4.  per Lauren patient will not take todays dose of 1 mg warfarin and then will start back daily warfarin 1 mg except on Tuesday and Friday patient will take 2 mg on those days.  Pt will recheck in 1 week..  gave instructions to caregiver and she understood.

## 2020-10-21 LAB — PROTIME-INR

## 2020-10-24 ENCOUNTER — Ambulatory Visit (INDEPENDENT_AMBULATORY_CARE_PROVIDER_SITE_OTHER): Payer: Medicare Other

## 2020-10-24 ENCOUNTER — Other Ambulatory Visit: Payer: Self-pay

## 2020-10-24 DIAGNOSIS — Z7901 Long term (current) use of anticoagulants: Secondary | ICD-10-CM | POA: Diagnosis not present

## 2020-10-24 DIAGNOSIS — I480 Paroxysmal atrial fibrillation: Secondary | ICD-10-CM

## 2020-10-24 DIAGNOSIS — I4891 Unspecified atrial fibrillation: Secondary | ICD-10-CM

## 2020-10-24 LAB — PROTIME-INR

## 2020-10-24 MED ORDER — WARFARIN SODIUM 1 MG PO TABS: 1.0000 mg | ORAL_TABLET | Freq: Every day | ORAL | 1 refills | Status: DC

## 2020-10-24 NOTE — Progress Notes (Signed)
Pt  INR 4.6  as per lauren  advised caregiver that skip today and go back coumadin 1 mg daily and repeat in 1 week

## 2020-10-27 ENCOUNTER — Telehealth: Payer: Medicare Other | Admitting: Nurse Practitioner

## 2020-10-27 ENCOUNTER — Telehealth: Payer: Self-pay

## 2020-10-27 ENCOUNTER — Other Ambulatory Visit: Payer: Self-pay

## 2020-10-27 ENCOUNTER — Inpatient Hospital Stay
Admission: EM | Admit: 2020-10-27 | Discharge: 2020-10-30 | DRG: 605 | Disposition: A | Discharge status: Home Health | Payer: Medicare Other | Attending: Family Medicine | Admitting: Family Medicine

## 2020-10-27 DIAGNOSIS — I480 Paroxysmal atrial fibrillation: Secondary | ICD-10-CM | POA: Diagnosis present

## 2020-10-27 DIAGNOSIS — I959 Hypotension, unspecified: Secondary | ICD-10-CM | POA: Diagnosis not present

## 2020-10-27 DIAGNOSIS — M7989 Other specified soft tissue disorders: Secondary | ICD-10-CM | POA: Diagnosis present

## 2020-10-27 DIAGNOSIS — Z66 Do not resuscitate: Secondary | ICD-10-CM | POA: Diagnosis present

## 2020-10-27 DIAGNOSIS — Z7901 Long term (current) use of anticoagulants: Secondary | ICD-10-CM

## 2020-10-27 DIAGNOSIS — I10 Essential (primary) hypertension: Secondary | ICD-10-CM | POA: Diagnosis present

## 2020-10-27 DIAGNOSIS — I69359 Hemiplegia and hemiparesis following cerebral infarction affecting unspecified side: Secondary | ICD-10-CM

## 2020-10-27 DIAGNOSIS — I5032 Chronic diastolic (congestive) heart failure: Secondary | ICD-10-CM | POA: Diagnosis not present

## 2020-10-27 DIAGNOSIS — F411 Generalized anxiety disorder: Secondary | ICD-10-CM | POA: Diagnosis present

## 2020-10-27 DIAGNOSIS — Z882 Allergy status to sulfonamides status: Secondary | ICD-10-CM

## 2020-10-27 DIAGNOSIS — Z79899 Other long term (current) drug therapy: Secondary | ICD-10-CM

## 2020-10-27 DIAGNOSIS — I4891 Unspecified atrial fibrillation: Secondary | ICD-10-CM | POA: Diagnosis present

## 2020-10-27 DIAGNOSIS — Z96642 Presence of left artificial hip joint: Secondary | ICD-10-CM | POA: Diagnosis present

## 2020-10-27 DIAGNOSIS — D649 Anemia, unspecified: Secondary | ICD-10-CM | POA: Diagnosis not present

## 2020-10-27 DIAGNOSIS — Z20822 Contact with and (suspected) exposure to covid-19: Secondary | ICD-10-CM | POA: Diagnosis not present

## 2020-10-27 DIAGNOSIS — M1990 Unspecified osteoarthritis, unspecified site: Secondary | ICD-10-CM | POA: Diagnosis present

## 2020-10-27 DIAGNOSIS — I69354 Hemiplegia and hemiparesis following cerebral infarction affecting left non-dominant side: Secondary | ICD-10-CM

## 2020-10-27 DIAGNOSIS — T148XXA Other injury of unspecified body region, initial encounter: Secondary | ICD-10-CM | POA: Diagnosis present

## 2020-10-27 DIAGNOSIS — Z79890 Hormone replacement therapy (postmenopausal): Secondary | ICD-10-CM

## 2020-10-27 DIAGNOSIS — G8929 Other chronic pain: Secondary | ICD-10-CM | POA: Diagnosis present

## 2020-10-27 DIAGNOSIS — R609 Edema, unspecified: Secondary | ICD-10-CM | POA: Diagnosis not present

## 2020-10-27 DIAGNOSIS — Z7401 Bed confinement status: Secondary | ICD-10-CM

## 2020-10-27 DIAGNOSIS — D62 Acute posthemorrhagic anemia: Secondary | ICD-10-CM

## 2020-10-27 DIAGNOSIS — G894 Chronic pain syndrome: Secondary | ICD-10-CM | POA: Diagnosis present

## 2020-10-27 DIAGNOSIS — I110 Hypertensive heart disease with heart failure: Secondary | ICD-10-CM | POA: Diagnosis present

## 2020-10-27 DIAGNOSIS — E782 Mixed hyperlipidemia: Secondary | ICD-10-CM | POA: Diagnosis present

## 2020-10-27 DIAGNOSIS — S7011XA Contusion of right thigh, initial encounter: Secondary | ICD-10-CM | POA: Diagnosis not present

## 2020-10-27 DIAGNOSIS — Z886 Allergy status to analgesic agent status: Secondary | ICD-10-CM

## 2020-10-27 DIAGNOSIS — D689 Coagulation defect, unspecified: Secondary | ICD-10-CM | POA: Diagnosis present

## 2020-10-27 DIAGNOSIS — J449 Chronic obstructive pulmonary disease, unspecified: Secondary | ICD-10-CM | POA: Diagnosis present

## 2020-10-27 DIAGNOSIS — S40022A Contusion of left upper arm, initial encounter: Secondary | ICD-10-CM | POA: Diagnosis present

## 2020-10-27 DIAGNOSIS — S8011XA Contusion of right lower leg, initial encounter: Secondary | ICD-10-CM | POA: Diagnosis present

## 2020-10-27 DIAGNOSIS — Z951 Presence of aortocoronary bypass graft: Secondary | ICD-10-CM

## 2020-10-27 DIAGNOSIS — E039 Hypothyroidism, unspecified: Secondary | ICD-10-CM | POA: Diagnosis present

## 2020-10-27 LAB — BASIC METABOLIC PANEL
Anion gap: 7 (ref 5–15)
BUN: 17 mg/dL (ref 8–23)
CO2: 23 mmol/L (ref 22–32)
Calcium: 7.9 mg/dL — ABNORMAL LOW (ref 8.9–10.3)
Chloride: 98 mmol/L (ref 98–111)
Creatinine, Ser: 0.51 mg/dL (ref 0.44–1.00)
GFR, Estimated: >60 mL/min (ref >60)
Glucose, Bld: 117 mg/dL — ABNORMAL HIGH (ref 70–99)
Potassium: 3.9 mmol/L (ref 3.5–5.1)
Sodium: 128 mmol/L — ABNORMAL LOW (ref 135–145)

## 2020-10-27 LAB — CBC WITH DIFFERENTIAL/PLATELET
Abs Immature Granulocytes: 0.08 10*3/uL — ABNORMAL HIGH (ref 0.00–0.07)
Basophils Absolute: 0 10*3/uL (ref 0.0–0.1)
Basophils Relative: 0 %
Eosinophils Absolute: 0 10*3/uL (ref 0.0–0.5)
Eosinophils Relative: 0 %
HCT: 17.2 % — ABNORMAL LOW (ref 36.0–46.0)
Hemoglobin: 5.3 g/dL — ABNORMAL LOW (ref 12.0–15.0)
Immature Granulocytes: 1 %
Lymphocytes Relative: 13 %
Lymphs Abs: 1.2 10*3/uL (ref 0.7–4.0)
MCH: 29.9 pg (ref 26.0–34.0)
MCHC: 30.8 g/dL (ref 30.0–36.0)
MCV: 97.2 fL (ref 80.0–100.0)
Monocytes Absolute: 0.6 10*3/uL (ref 0.1–1.0)
Monocytes Relative: 6 %
Neutro Abs: 7.5 10*3/uL (ref 1.7–7.7)
Neutrophils Relative %: 80 %
Platelets: 385 10*3/uL (ref 150–400)
RBC: 1.77 MIL/uL — ABNORMAL LOW (ref 3.87–5.11)
RDW: 17.2 % — ABNORMAL HIGH (ref 11.5–15.5)
WBC: 9.5 10*3/uL (ref 4.0–10.5)
nRBC: 0 % (ref 0.0–0.2)

## 2020-10-27 LAB — PREPARE RBC (CROSSMATCH)

## 2020-10-27 LAB — RESP PANEL BY RT-PCR (FLU A&B, COVID) ARPGX2
Influenza A by PCR: NEGATIVE
Influenza B by PCR: NEGATIVE
SARS Coronavirus 2 by RT PCR: NEGATIVE

## 2020-10-27 LAB — PROTIME-INR
INR: 3.8 — ABNORMAL HIGH (ref 0.8–1.2)
Prothrombin Time: 37.3 seconds — ABNORMAL HIGH (ref 11.4–15.2)

## 2020-10-27 MED ORDER — LEVOTHYROXINE SODIUM 50 MCG PO TABS
25.0000 ug | ORAL_TABLET | Freq: Every day | ORAL | Status: DC
  Administered 2020-10-28 – 2020-10-30 (×3): 25 ug via ORAL
  Filled 2020-10-27 (×3): qty 1

## 2020-10-27 MED ORDER — ONDANSETRON HCL 4 MG/2ML IJ SOLN: 4.0000 mg | Freq: Four times a day (QID) | INTRAMUSCULAR | Status: DC | PRN

## 2020-10-27 MED ORDER — SODIUM CHLORIDE 0.9 % IV SOLN: 10.0000 mL/h | Freq: Once | INTRAVENOUS | Status: DC

## 2020-10-27 MED ORDER — ATORVASTATIN CALCIUM 20 MG PO TABS
40.0000 mg | ORAL_TABLET | Freq: Every day | ORAL | Status: DC
  Administered 2020-10-28 – 2020-10-30 (×3): 40 mg via ORAL
  Filled 2020-10-27 (×3): qty 2

## 2020-10-27 MED ORDER — HYDROCODONE-ACETAMINOPHEN 5-325 MG PO TABS: 1.0000 | ORAL_TABLET | Freq: Four times a day (QID) | ORAL | Status: DC | PRN

## 2020-10-27 MED ORDER — METOPROLOL TARTRATE 25 MG PO TABS
12.5000 mg | ORAL_TABLET | Freq: Two times a day (BID) | ORAL | Status: DC
  Administered 2020-10-28 – 2020-10-30 (×5): 12.5 mg via ORAL
  Filled 2020-10-27 (×5): qty 1

## 2020-10-27 MED ORDER — ONDANSETRON HCL 4 MG PO TABS: 4.0000 mg | ORAL_TABLET | Freq: Four times a day (QID) | ORAL | Status: DC | PRN

## 2020-10-27 MED ORDER — FENOFIBRATE 160 MG PO TABS
160.0000 mg | ORAL_TABLET | Freq: Every day | ORAL | Status: DC
  Administered 2020-10-28 – 2020-10-30 (×3): 160 mg via ORAL
  Filled 2020-10-27 (×3): qty 1

## 2020-10-27 MED ORDER — DEXTROSE-NACL 5-0.9 % IV SOLN: INTRAVENOUS | Status: DC

## 2020-10-27 MED ORDER — MORPHINE SULFATE (PF) 2 MG/ML IV SOLN: 2.0000 mg | INTRAVENOUS | Status: DC | PRN

## 2020-10-27 MED ORDER — POLYETHYLENE GLYCOL 3350 17 G PO PACK
17.0000 g | PACK | Freq: Every day | ORAL | Status: DC
  Administered 2020-10-28 – 2020-10-30 (×3): 17 g via ORAL
  Filled 2020-10-27 (×3): qty 1

## 2020-10-27 NOTE — ED Triage Notes (Signed)
Pt comes with c/o left arm, hand pain and swelling. Pt also states some bruising to right leg. Pt is on blood thinners. No recent injuries.  Pt denies any CP or SOb. Pt is bedbound. VSS

## 2020-10-27 NOTE — ED Notes (Signed)
Chaperoned ED provider for rectal exam.

## 2020-10-27 NOTE — ED Provider Notes (Signed)
[facility] Emergency Department Provider Note  ____________________________________________   I have reviewed the triage vital signs and the nursing notes.   HISTORY  Chief Complaint Right leg bruise   History limited by: Not Limited   HPI Teresa Holmes is a 70 y.o. female who presents to the emergency department today because of concern for bruising to her right leg. She says that it started roughly 1 week ago. Unclear why there was a delay in coming to the emergency department for evaluation of the bruising. She does state that her caregiver had to bring her but that her caregiver does see her everyday. She denies any trauma to her leg, she is bed bound. She does have some bruising to her left arm as well that has been present for roughly 1 week. Additionally she has swelling of her left arm but states that's chronic.    Records reviewed. Per medical record review patient has a history of CHF, COPD.   Past Medical History:  Diagnosis Date   Aneurysm ([place])    Anxiety    Arthritis    Cancer ([place])    CHF (congestive heart failure) ([place])    COPD (chronic obstructive pulmonary disease) ([place])    Hyperlipidemia    Hypertension    Stroke [facility])    Thyroid disease     Patient Active Problem List   Diagnosis Date Noted   Hip fracture ([place]) 11/10/2019   Closed rib fracture 11/10/2019   Acute blood loss anemia 11/10/2019   AF (paroxysmal atrial fibrillation) ([place]) 11/10/2019   CVA, old, hemiparesis ([place]) 11/10/2019   Wound of skin 11/10/2019   Pulmonary emphysema ([place]) 10/23/2019   Pelvic fracture ([place]) 09/28/2018   Encounter for long-term (current) use of medications 05/11/2017   Chronic pain disorder 05/11/2017   Generalized anxiety disorder 03/14/2017   Essential (primary) hypertension 03/14/2017   Hemiplegia and hemiparesis following cerebral infarction affecting left non-dominant side ([place]) 03/14/2017   Vitamin D deficiency, unspecified  03/14/2017   Personal history of venous thrombosis and embolism 03/14/2017   Congenital spondylolisthesis 03/14/2017   Nicotine dependence, cigarettes, uncomplicated 89/38/1017   Mixed hyperlipidemia 03/14/2017   Long term (current) use of anticoagulants 03/14/2017   Other intervertebral disc degeneration, thoracolumbar region 03/14/2017   Lumbago with sciatica 03/14/2017   Cardiac arrhythmia 03/14/2017   Myalgia 03/14/2017   Hypothyroidism 03/14/2017   Difficulty walking 03/14/2017   Left knee pain 03/14/2017    Past Surgical History:  Procedure Laterality Date   ABDOMINAL HYSTERECTOMY     AORTIC/RENAL BYPASS     bypass graft     CHOLECYSTECTOMY     CORONARY ARTERY BYPASS GRAFT     HIP ARTHROPLASTY Left 11/11/2019   Procedure: ARTHROPLASTY BIPOLAR HIP (HEMIARTHROPLASTY);  Surgeon: Corky Mull, MD;  Location: [facility] ORS;  Service: Orthopedics;  Laterality: Left;    Prior to Admission medications   Medication Sig Start Date End Date Taking? Authorizing Provider  atorvastatin (LIPITOR) 40 MG tablet Take 1 tablet (40 mg total) by mouth daily. 07/29/20   Lavera Guise, MD  fenofibrate (TRICOR) 145 MG tablet Take 1 tablet (145 mg total) by mouth daily. 07/29/20   Lavera Guise, MD  furosemide (LASIX) 20 MG tablet 20 MG FOR 3 DAYS THEN ONE EVERY OTHER DAY 10/20/20   Jonetta Osgood, NP  Hydroactive Dressings (DUODERM HYDROACTIVE) GEL Apply to the affected area once a day. Must cover with gauze 09/25/20   Lavera Guise, MD  HYDROcodone-acetaminophen (NORCO/VICODIN)  5-325 MG tablet Take 1-2 tablets by mouth every 6 (six) hours as needed for moderate pain. 03/31/20   Luiz Ochoa, NP  levothyroxine (SYNTHROID) 25 MCG tablet Take 1 tablet (25 mcg total) by mouth daily before breakfast. 04/02/20   Luiz Ochoa, NP  metoprolol tartrate (LOPRESSOR) 25 MG tablet Take 0.5 tablets (12.5 mg total) by mouth 2 (two) times daily. 10/20/20   Jonetta Osgood, NP  polyethylene glycol (MIRALAX /  GLYCOLAX) 17 g packet Take 17 g by mouth daily. 11/14/19   Shelly Coss, MD  warfarin (COUMADIN) 1 MG tablet Take 1 tablet (1 mg total) by mouth daily. 10/24/20   McDonough, Si Gaul, PA-C    Allergies Aspirin and Sulfa antibiotics  Family History  Problem Relation Age of Onset   Cancer Mother     Social History Social History   Tobacco Use   Smoking status: Every Day    Types: Cigarettes   Smokeless tobacco: Never  Vaping Use   Vaping Use: Never used  Substance Use Topics   Alcohol use: No   Drug use: Never    Review of Systems Constitutional: No fever/chills Eyes: No visual changes. ENT: No sore throat. Cardiovascular: Denies chest pain. Respiratory: Denies shortness of breath. Gastrointestinal: No abdominal pain.  No nausea, no vomiting.  No diarrhea.   Genitourinary: Negative for dysuria. Musculoskeletal: Positive for left arm swelling.  Skin: Positive for right leg and left arm bruising. Neurological: Negative for headaches, focal weakness or numbness.  ____________________________________________   PHYSICAL EXAM:  VITAL SIGNS: ED Triage Vitals [10/27/20 1716]  Enc Vitals Group     BP 126/65     Pulse Rate 85     Resp 16     Temp 98.6 F (37 C)     Temp Source Oral     SpO2 96 %     Weight      Height      Head Circumference      Peak Flow      Pain Score 5    Constitutional: Alert and oriented.  Eyes: Conjunctivae are normal.  ENT      Head: Normocephalic and atraumatic.      Nose: No congestion/rhinnorhea.      Mouth/Throat: Mucous membranes are moist.      Neck: No stridor. Hematological/Lymphatic/Immunilogical: No cervical lymphadenopathy. Cardiovascular: Normal rate, regular rhythm.  No murmurs, rubs, or gallops.  Respiratory: Normal respiratory effort without tachypnea nor retractions. Breath sounds are clear and equal bilaterally. No wheezes/rales/rhonchi. Gastrointestinal: Soft and non tender. No rebound. No guarding.   Genitourinary: Deferred Musculoskeletal: Normal range of motion in all extremities. No lower extremity edema. Neurologic:  Normal speech and language. Sequale of CVA.  Skin:  Large bruise to right buttock and right thigh.  Psychiatric: Mood and affect are normal. Speech and behavior are normal. Patient exhibits appropriate insight and judgment.  ____________________________________________    LABS (pertinent positives/negatives)  COVID negative BMP na 128, glu 117, ca 7.9 CBC wbc 9.5, hgb 5.3, plt 385  ____________________________________________   EKG  None  ____________________________________________    RADIOLOGY  None  ____________________________________________   PROCEDURES  Procedures  ____________________________________________   INITIAL IMPRESSION / ASSESSMENT AND PLAN / ED COURSE  Pertinent labs & imaging results that were available during my care of the patient were reviewed by me and considered in my medical decision making (see chart for details).   Patient presented to the emergency department today because of large bruise to right  lower extremity that has been present for roughly 1 week. On exam patient does have large bruise to right buttock and thigh. INR is elevated today. Given extent of bruise blood work was checked and patient was found to be anemic. GUIAC was checked and was negative. I do think it is likely patient dropped her blood level secondary to the bruise. Discussed blood transfusion with the patient. Will plan on admission.  ____________________________________________   FINAL CLINICAL IMPRESSION(S) / ED DIAGNOSES  Final diagnoses:  Anemia, unspecified type     Note: This dictation was prepared with Dragon dictation. Any transcriptional errors that result from this process are unintentional     Nance Pear, MD 10/27/20 2012

## 2020-10-27 NOTE — ED Notes (Signed)
Lab called to state type and screen sample hemolyzed, lab will come to ED to redraw labs.

## 2020-10-27 NOTE — H&P (Addendum)
History and Physical   Teresa Holmes HCW:[id number] DOB: [birth year] DOA: 10/27/2020  Referring MD/NP/PA: Dr. Archie Balboa  PCP: Pcp, No   Outpatient Specialists: None  Patient coming from: Home  Chief Complaint: Right leg swelling and pain with bruise  HPI: Teresa Holmes is a 70 y.o. female with medical history significant of chronic anticoagulation secondary to atrial fibrillation with warfarin, diastolic dysfunction CHF, COPD, essential hypertension, previous CVA, hypothyroidism, osteoarthritis and anxiety disorder who presents to the ER because of concern for bruising of her right leg from the thigh all the way to the calf region.  This has been gradually getting worse over the last 1 week.  Associated with significant pain.  Denied any fever or chills denied any injury.  Patient is on anticoagulation and has not seen any bleeding as well.  Patient noted to have significant drop in her hemoglobin and a huge hematoma in the right thigh to the calf.  Patient is literally bedbound for the most part.  Previous multiple leg surgeries.  She is being admitted now with symptomatic anemia with significant hematoma and coagulopathy.  ED Course: Temperature 99.2, blood pressure 140/61, pulse 85, respirate of 24 and oxygen sats 96% on room air.  White count 9.5, hemoglobin 5.3 and platelets 385.  Sodium 128 potassium 3.8 chronic 8 CO2 23 BUN 17 creatinine 1.51 and calcium 7.9.  INR is 3.8 glucose 117.  COVID as well as influenza screen negative.  Patient being admitted with symptomatic anemia on red lower extremity hematoma  Review of Systems: As per HPI otherwise 10 point review of systems negative.    Past Medical History:  Diagnosis Date   Aneurysm ([place])    Anxiety    Arthritis    Cancer ([place])    CHF (congestive heart failure) (H[facility])    COPD (chronic obstructive pulmonary disease) ([place])    Hyperlipidemia    Hypertension    Stroke ([place])    Thyroid disease     Past Surgical History:   Procedure Laterality Date   ABDOMINAL HYSTERECTOMY     AORTIC/RENAL BYPASS     bypass graft     CHOLECYSTECTOMY     CORONARY ARTERY BYPASS GRAFT     HIP ARTHROPLASTY Left 11/11/2019   Procedure: ARTHROPLASTY BIPOLAR HIP (HEMIARTHROPLASTY);  Surgeon: Corky Mull, MD;  Location: [facility] ORS;  Service: Orthopedics;  Laterality: Left;     reports that she has been smoking cigarettes. She has never used smokeless tobacco. She reports that she does not drink alcohol and does not use drugs.  Allergies  Allergen Reactions   Aspirin Other (See Comments)    ulcer   Sulfa Antibiotics Other (See Comments)    unknown    Family History  Problem Relation Age of Onset   Cancer Mother      Prior to Admission medications   Medication Sig Start Date End Date Taking? Authorizing Provider  atorvastatin (LIPITOR) 40 MG tablet Take 1 tablet (40 mg total) by mouth daily. 07/29/20   Lavera Guise, MD  fenofibrate (TRICOR) 145 MG tablet Take 1 tablet (145 mg total) by mouth daily. 07/29/20   Lavera Guise, MD  furosemide (LASIX) 20 MG tablet 20 MG FOR 3 DAYS THEN ONE EVERY OTHER DAY 10/20/20   Jonetta Osgood, NP  Hydroactive Dressings (DUODERM HYDROACTIVE) GEL Apply to the affected area once a day. Must cover with gauze 09/25/20   Lavera Guise, MD  HYDROcodone-acetaminophen (NORCO/VICODIN) 5-325 MG tablet Take 1-2 tablets by  mouth every 6 (six) hours as needed for moderate pain. 03/31/20   Luiz Ochoa, NP  levothyroxine (SYNTHROID) 25 MCG tablet Take 1 tablet (25 mcg total) by mouth daily before breakfast. 04/02/20   Luiz Ochoa, NP  metoprolol tartrate (LOPRESSOR) 25 MG tablet Take 0.5 tablets (12.5 mg total) by mouth 2 (two) times daily. 10/20/20   Jonetta Osgood, NP  polyethylene glycol (MIRALAX / GLYCOLAX) 17 g packet Take 17 g by mouth daily. 11/14/19   Shelly Coss, MD  warfarin (COUMADIN) 1 MG tablet Take 1 tablet (1 mg total) by mouth daily. 10/24/20   Mylinda Latina, PA-C     Physical Exam: Vitals:   10/27/20 1716 10/27/20 1933  BP: 126/65 (!) 117/41  Pulse: 85 81  Resp: 16 (!) 24  Temp: 98.6 F (37 C)   TempSrc: Oral   SpO2: 96% 99%      Constitutional: Chronically ill looking, no distress Vitals:   10/27/20 1716 10/27/20 1933  BP: 126/65 (!) 117/41  Pulse: 85 81  Resp: 16 (!) 24  Temp: 98.6 F (37 C)   TempSrc: Oral   SpO2: 96% 99%   Eyes: PERRL, lids and conjunctivae normal ENMT: Mucous membranes are dry. Posterior pharynx clear of any exudate or lesions.Normal dentition.  Neck: normal, supple, no masses, no thyromegaly Respiratory: clear to auscultation bilaterally, no wheezing, no crackles. Normal respiratory effort. No accessory muscle use.  Cardiovascular: Regular rate and rhythm, no murmurs / rubs / gallops. No extremity edema. 2+ pedal pulses. No carotid bruits.  Abdomen: no tenderness, no masses palpated. No hepatosplenomegaly. Bowel sounds positive.  Musculoskeletal: Right lower extremity swollen, firm, tender, huge hematoma from the posterior aspect of the thigh to the calf with marked bruising, marked muscle wasting of the lower extremity Skin: Marked bruising with hematoma Neurologic: CN 2-12 grossly intact. Sensation intact, DTR normal. Strength 5/5 in all 4.  Psychiatric: Drowsy    Labs on Admission: I have personally reviewed following labs and imaging studies  CBC: Recent Labs  Lab 10/27/20 1813  WBC 9.5  NEUTROABS 7.5  HGB 5.3*  HCT 17.2*  MCV 97.2  PLT 761   Basic Metabolic Panel: Recent Labs  Lab 10/27/20 1813  NA 128*  K 3.9  CL 98  CO2 23  GLUCOSE 117*  BUN 17  CREATININE 0.51  CALCIUM 7.9*   GFR: CrCl cannot be calculated (Unknown ideal weight.). Liver Function Tests: No results for input(s): AST, ALT, ALKPHOS, BILITOT, PROT, ALBUMIN in the last 168 hours. No results for input(s): LIPASE, AMYLASE in the last 168 hours. No results for input(s): AMMONIA in the last 168 hours. Coagulation  Profile: Recent Labs  Lab 10/27/20 1813  INR 3.8*   Cardiac Enzymes: No results for input(s): CKTOTAL, CKMB, CKMBINDEX, TROPONINI in the last 168 hours. BNP (last 3 results) No results for input(s): PROBNP in the last 8760 hours. HbA1C: No results for input(s): HGBA1C in the last 72 hours. CBG: No results for input(s): GLUCAP in the last 168 hours. Lipid Profile: No results for input(s): CHOL, HDL, LDLCALC, TRIG, CHOLHDL, LDLDIRECT in the last 72 hours. Thyroid Function Tests: No results for input(s): TSH, T4TOTAL, FREET4, T3FREE, THYROIDAB in the last 72 hours. Anemia Panel: No results for input(s): VITAMINB12, FOLATE, FERRITIN, TIBC, IRON, RETICCTPCT in the last 72 hours. Urine analysis:    Component Value Date/Time   COLORURINE YELLOW (A) 10/03/2018 0513   APPEARANCEUR CLEAR (A) 10/03/2018 0513   APPEARANCEUR Clear 09/05/2017 1641  LABSPEC 1.010 10/03/2018 0513   PHURINE 7.0 10/03/2018 0513   GLUCOSEU >=500 (A) 10/03/2018 0513   HGBUR NEGATIVE 10/03/2018 0513   BILIRUBINUR NEGATIVE 10/03/2018 0513   BILIRUBINUR Negative 09/05/2017 1641   KETONESUR NEGATIVE 10/03/2018 0513   PROTEINUR NEGATIVE 10/03/2018 0513   NITRITE NEGATIVE 10/03/2018 0513   LEUKOCYTESUR SMALL (A) 10/03/2018 0513   Sepsis Labs: @LABRCNTIP (procalcitonin:4,lacticidven:4) ) Recent Results (from the past 240 hour(s))  Resp Panel by RT-PCR (Flu A&B, Covid) Nasopharyngeal Swab     Status: None   Collection Time: 10/27/20  7:02 PM   Specimen: Nasopharyngeal Swab; Nasopharyngeal(NP) swabs in vial transport medium  Result Value Ref Range Status   SARS Coronavirus 2 by RT PCR NEGATIVE NEGATIVE Final    Comment: (NOTE) SARS-CoV-2 target nucleic acids are NOT DETECTED.  The SARS-CoV-2 RNA is generally detectable in upper respiratory specimens during the acute phase of infection. The lowest concentration of SARS-CoV-2 viral copies this assay can detect is 138 copies/mL. A negative result does not  preclude SARS-Cov-2 infection and should not be used as the sole basis for treatment or other patient management decisions. A negative result may occur with  improper specimen collection/handling, submission of specimen other than nasopharyngeal swab, presence of viral mutation(s) within the areas targeted by this assay, and inadequate number of viral copies(<138 copies/mL). A negative result must be combined with clinical observations, patient history, and epidemiological information. The expected result is Negative.  Fact Sheet for Patients:  EntrepreneurPulse.com.au  Fact Sheet for Healthcare Providers:  IncredibleEmployment.be  This test is no t yet approved or cleared by the [place] FDA and  has been authorized for detection and/or diagnosis of SARS-CoV-2 by FDA under an Emergency Use Authorization (EUA). This EUA will remain  in effect (meaning this test can be used) for the duration of the COVID-19 declaration under Section 564(b)(1) of the Act, 21 U.S.C.section 360bbb-3(b)(1), unless the authorization is terminated  or revoked sooner.       Influenza A by PCR NEGATIVE NEGATIVE Final   Influenza B by PCR NEGATIVE NEGATIVE Final    Comment: (NOTE) The Xpert Xpress SARS-CoV-2/FLU/RSV plus assay is intended as an aid in the diagnosis of influenza from Nasopharyngeal swab specimens and should not be used as a sole basis for treatment. Nasal washings and aspirates are unacceptable for Xpert Xpress SARS-CoV-2/FLU/RSV testing.  Fact Sheet for Patients: EntrepreneurPulse.com.au  Fact Sheet for Healthcare Providers: IncredibleEmployment.be  This test is not yet approved or cleared by the [place] FDA and has been authorized for detection and/or diagnosis of SARS-CoV-2 by FDA under an Emergency Use Authorization (EUA). This EUA will remain in effect (meaning this test can be used) for the  duration of the COVID-19 declaration under Section 564(b)(1) of the Act, 21 U.S.C. section 360bbb-3(b)(1), unless the authorization is terminated or revoked.  Performed at [facility], [address] [place], [place] [address]      Radiological Exams on Admission: No results found.    Assessment/Plan Principal Problem:   ABLA (acute blood loss anemia) Active Problems:   Generalized anxiety disorder   Essential (primary) hypertension   Mixed hyperlipidemia   Hypothyroidism   Chronic pain disorder   AF (paroxysmal atrial fibrillation) ([facility])   Wound of skin     #1 acute blood loss anemia: Patient will be admitted and transfuse initial 2 units of packed red blood cells.  Reassess H&H and consider further transfusion.  We will hold her warfarin.  We will follow INR.  If bleeding continues will reverse the INR with vitamin K or FFP.  #2 right lower extremity hematoma: Supportive care.  Check CT to rule out compartment syndrome or other complications.  #3 paroxysmal atrial fibrillation: Rate controlled.  Holding warfarin at this point.  #4 essential hypertension: Continue metoprolol.  #5 hyperlipidemia: Continue with statin  #6 hypothyroidism: Continue levothyroxine  #6 generalized anxiety disorder: Counseling.   DVT prophylaxis: SCD Code Status: DNR Family Communication: No family at bedside Disposition Plan: To be determined probably back to skilled facility Consults called: None Admission status: Inpatient  Severity of Illness: The appropriate patient status for this patient is INPATIENT. Inpatient status is judged to be reasonable and necessary in order to provide the required intensity of service to ensure the patient's safety. The patient's presenting symptoms, physical exam findings, and initial radiographic and laboratory data in the context of their chronic comorbidities is felt to place them at high risk for further clinical deterioration.  Furthermore, it is not anticipated that the patient will be medically stable for discharge from the hospital within 2 midnights of admission. The following factors support the patient status of inpatient.   " The patient's presenting symptoms include leg swelling and pain. " The worrisome physical exam findings include marked bruising and hematoma of the right lower extremity. " The initial radiographic and laboratory data are worrisome because of hemoglobin of 5.3. " The chronic co-morbidities include paroxysmal atrial fibrillation.   * I certify that at the point of admission it is my clinical judgment that the patient will require inpatient hospital care spanning beyond 2 midnights from the point of admission due to high intensity of service, high risk for further deterioration and high frequency of surveillance required.Barbette Merino MD [facility] Pager [phone]  If 7PM-7AM, please contact night-coverage [email or web address] Password [facility]  10/27/2020, 8:46 PM

## 2020-10-27 NOTE — Telephone Encounter (Signed)
Caregiver states she's taking her to the hospital via ambulance..caregvier advised that we can NOT tell the hospital what to do or order (labs) . Due to bruising.

## 2020-10-28 ENCOUNTER — Observation Stay: Payer: Medicare Other

## 2020-10-28 DIAGNOSIS — E782 Mixed hyperlipidemia: Secondary | ICD-10-CM | POA: Diagnosis present

## 2020-10-28 DIAGNOSIS — I69354 Hemiplegia and hemiparesis following cerebral infarction affecting left non-dominant side: Secondary | ICD-10-CM | POA: Diagnosis not present

## 2020-10-28 DIAGNOSIS — Z886 Allergy status to analgesic agent status: Secondary | ICD-10-CM | POA: Diagnosis not present

## 2020-10-28 DIAGNOSIS — S40022A Contusion of left upper arm, initial encounter: Secondary | ICD-10-CM | POA: Diagnosis present

## 2020-10-28 DIAGNOSIS — J449 Chronic obstructive pulmonary disease, unspecified: Secondary | ICD-10-CM | POA: Diagnosis present

## 2020-10-28 DIAGNOSIS — Z20822 Contact with and (suspected) exposure to covid-19: Secondary | ICD-10-CM | POA: Diagnosis present

## 2020-10-28 DIAGNOSIS — I4891 Unspecified atrial fibrillation: Secondary | ICD-10-CM | POA: Diagnosis not present

## 2020-10-28 DIAGNOSIS — S8011XA Contusion of right lower leg, initial encounter: Secondary | ICD-10-CM | POA: Diagnosis present

## 2020-10-28 DIAGNOSIS — I110 Hypertensive heart disease with heart failure: Secondary | ICD-10-CM | POA: Diagnosis present

## 2020-10-28 DIAGNOSIS — F411 Generalized anxiety disorder: Secondary | ICD-10-CM | POA: Diagnosis present

## 2020-10-28 DIAGNOSIS — M1990 Unspecified osteoarthritis, unspecified site: Secondary | ICD-10-CM | POA: Diagnosis present

## 2020-10-28 DIAGNOSIS — M7989 Other specified soft tissue disorders: Secondary | ICD-10-CM | POA: Diagnosis present

## 2020-10-28 DIAGNOSIS — Z96642 Presence of left artificial hip joint: Secondary | ICD-10-CM | POA: Diagnosis present

## 2020-10-28 DIAGNOSIS — R52 Pain, unspecified: Secondary | ICD-10-CM | POA: Diagnosis not present

## 2020-10-28 DIAGNOSIS — D689 Coagulation defect, unspecified: Secondary | ICD-10-CM | POA: Diagnosis present

## 2020-10-28 DIAGNOSIS — I5032 Chronic diastolic (congestive) heart failure: Secondary | ICD-10-CM | POA: Diagnosis present

## 2020-10-28 DIAGNOSIS — S7011XA Contusion of right thigh, initial encounter: Secondary | ICD-10-CM | POA: Diagnosis present

## 2020-10-28 DIAGNOSIS — Z66 Do not resuscitate: Secondary | ICD-10-CM | POA: Diagnosis present

## 2020-10-28 DIAGNOSIS — Z882 Allergy status to sulfonamides status: Secondary | ICD-10-CM | POA: Diagnosis not present

## 2020-10-28 DIAGNOSIS — G8929 Other chronic pain: Secondary | ICD-10-CM | POA: Diagnosis present

## 2020-10-28 DIAGNOSIS — D62 Acute posthemorrhagic anemia: Secondary | ICD-10-CM | POA: Diagnosis present

## 2020-10-28 DIAGNOSIS — Z951 Presence of aortocoronary bypass graft: Secondary | ICD-10-CM | POA: Diagnosis not present

## 2020-10-28 DIAGNOSIS — G459 Transient cerebral ischemic attack, unspecified: Secondary | ICD-10-CM | POA: Diagnosis not present

## 2020-10-28 DIAGNOSIS — I480 Paroxysmal atrial fibrillation: Secondary | ICD-10-CM | POA: Diagnosis present

## 2020-10-28 DIAGNOSIS — Z7901 Long term (current) use of anticoagulants: Secondary | ICD-10-CM | POA: Diagnosis not present

## 2020-10-28 DIAGNOSIS — Z7401 Bed confinement status: Secondary | ICD-10-CM | POA: Diagnosis not present

## 2020-10-28 DIAGNOSIS — E039 Hypothyroidism, unspecified: Secondary | ICD-10-CM | POA: Diagnosis present

## 2020-10-28 LAB — TYPE AND SCREEN
ABO/RH(D): A POS
Antibody Screen: NEGATIVE
Unit division: 0

## 2020-10-28 LAB — COMPREHENSIVE METABOLIC PANEL
ALT: 20 U/L (ref 0–44)
AST: 51 U/L — ABNORMAL HIGH (ref 15–41)
Albumin: 2 g/dL — ABNORMAL LOW (ref 3.5–5.0)
Alkaline Phosphatase: 61 U/L (ref 38–126)
Anion gap: 8 (ref 5–15)
BUN: 15 mg/dL (ref 8–23)
CO2: 23 mmol/L (ref 22–32)
Calcium: 7.9 mg/dL — ABNORMAL LOW (ref 8.9–10.3)
Chloride: 100 mmol/L (ref 98–111)
Creatinine, Ser: 0.53 mg/dL (ref 0.44–1.00)
GFR, Estimated: >60 mL/min (ref >60)
Glucose, Bld: 154 mg/dL — ABNORMAL HIGH (ref 70–99)
Potassium: 3.6 mmol/L (ref 3.5–5.1)
Sodium: 131 mmol/L — ABNORMAL LOW (ref 135–145)
Total Bilirubin: 1.9 mg/dL — ABNORMAL HIGH (ref 0.3–1.2)
Total Protein: 5.3 g/dL — ABNORMAL LOW (ref 6.5–8.1)

## 2020-10-28 LAB — BPAM RBC
Blood Product Expiration Date: 202210122359
ISSUE DATE / TIME: 202209262118
Unit Type and Rh: 6200

## 2020-10-28 LAB — CBC
HCT: 23.2 % — ABNORMAL LOW (ref 36.0–46.0)
Hemoglobin: 7.7 g/dL — ABNORMAL LOW (ref 12.0–15.0)
MCH: 30.1 pg (ref 26.0–34.0)
MCHC: 33.2 g/dL (ref 30.0–36.0)
MCV: 90.6 fL (ref 80.0–100.0)
Platelets: 354 10*3/uL (ref 150–400)
RBC: 2.56 MIL/uL — ABNORMAL LOW (ref 3.87–5.11)
RDW: 18.6 % — ABNORMAL HIGH (ref 11.5–15.5)
WBC: 7.6 10*3/uL (ref 4.0–10.5)
nRBC: 0 % (ref 0.0–0.2)

## 2020-10-28 NOTE — Progress Notes (Addendum)
PROGRESS NOTE    Teresa Holmes  OZD:[id number] DOB: 08/14/1950 DOA: 10/27/2020 PCP: Pcp, No     Brief Narrative:  Teresa Holmes is a 70 y.o. female with medical history significant of chronic anticoagulation secondary to atrial fibrillation with warfarin, diastolic dysfunction CHF, COPD, essential hypertension, previous CVA, hypothyroidism, osteoarthritis and anxiety disorder who presents to the ER because of concern for bruising of her right leg from the thigh all the way to the calf region.  This has been gradually getting worse over the last 1 week.  Associated with significant pain.  Patient noted to have significant drop in her hemoglobin and a huge hematoma in the right thigh to the calf.  Patient is bedbound for the most part.  Previous multiple leg surgeries.  She was admitted now with symptomatic anemia with significant hematoma and coagulopathy INR 3.8.   New events last 24 hours / Subjective: Patient in bed, eating breakfast. No new complaints this morning, denies dizziness or lightheadedness, no falls or trauma to her leg to explain hematoma.   Assessment & Plan:   Principal Problem:   ABLA (acute blood loss anemia) Active Problems:   Generalized anxiety disorder   Essential (primary) hypertension   Mixed hyperlipidemia   Hypothyroidism   Chronic pain disorder   AF (paroxysmal atrial fibrillation) (HCC)   Wound of skin   Acute blood loss anemia secondary to spontaneous right thigh hematoma -Continue to hold Coumadin -Status post 1 unit packed red blood cells with stabilization of hemoglobin -Measure right thigh circumference every shift -Trend CBC, INR  Paroxysmal A. fib -Continue to hold Coumadin -Continue metoprolol  Hyperlipidemia -Continue statin  Hypothyroidism -Continue Synthroid   DVT prophylaxis:  Foot Pump / plexipulse Start: 10/27/20 2044  Code Status:     Code Status Orders  (From admission, onward)           Start     Ordered    10/28/20 0005  Do not attempt resuscitation (DNR)  Continuous       Question Answer Comment  In the event of cardiac or respiratory ARREST Do not call a "code blue"   In the event of cardiac or respiratory ARREST Do not perform Intubation, CPR, defibrillation or ACLS   In the event of cardiac or respiratory ARREST Use medication by any route, position, wound care, and other measures to relive pain and suffering. May use oxygen, suction and manual treatment of airway obstruction as needed for comfort.      10/28/20 0004           Code Status History     Date Active Date Inactive Code Status Order ID Comments User Context   10/27/2020 2045 10/28/2020 0004 Full Code [id number]  Elwyn Reach, MD ED   11/10/2019 1902 11/15/2019 2002 Full Code [id number]  Orene Desanctis, DO ED   09/29/2018 0954 10/03/2018 1647 DNR [id number]  Hillary Bow, MD Inpatient   09/28/2018 2359 09/29/2018 0954 Full Code [id number]  Mansy, Arvella Merles, MD ED      Advance Directive Documentation    Flowsheet Row Most Recent Value  Type of Advance Directive Healthcare Power of Attorney  Pre-existing out of facility DNR order (yellow form or pink MOST form) --  "MOST" Form in Place? --      Family Communication: No family at bedside Disposition Plan:  Status is: Observation  The patient will require care spanning > 2 midnights and should be moved to inpatient because: Inpatient level of  care appropriate due to severity of illness  Dispo: The patient is from: Home              Anticipated d/c is to: Home              Patient currently is not medically stable to d/c.   Difficult to place patient No    Antimicrobials:  Anti-infectives (From admission, onward)    None        Objective: Vitals:   10/28/20 0005 10/28/20 0335 10/28/20 0442 10/28/20 0755  BP: 123/89  (!) 143/53 (!) 132/52  Pulse: 78  81 75  Resp: 20  18 18   Temp: 98.5 F (36.9 C)  98.6 F (37 C) 99.5 F (37.5 C)  TempSrc: Oral  Oral Oral   SpO2: 100%  98% 99%  Weight:  43.1 kg    Height:  4\' 7"  (1.397 m)      Intake/Output Summary (Last 24 hours) at 10/28/2020 1128 Last data filed at 10/28/2020 1047 Gross per 24 hour  Intake 1262.92 ml  Output --  Net 1262.92 ml   Filed Weights   10/28/20 0335  Weight: 43.1 kg    Examination:  General exam: Appears calm and comfortable  Respiratory system: Clear to auscultation. Respiratory effort normal. No respiratory distress. No conversational dyspnea.  Cardiovascular system: S1 & S2 heard. No murmurs. Gastrointestinal system: Abdomen is nondistended, soft and nontender. Normal bowel sounds heard. Central nervous system: Alert and oriented Extremities: Right thigh with bruising and edema, soft and not TTP  Psychiatry: Judgement and insight appear normal. Mood & affect appropriate.   Data Reviewed: I have personally reviewed following labs and imaging studies  CBC: Recent Labs  Lab 10/27/20 1813 10/28/20 0510  WBC 9.5 7.6  NEUTROABS 7.5  --   HGB 5.3* 7.7*  HCT 17.2* 23.2*  MCV 97.2 90.6  PLT 385 295   Basic Metabolic Panel: Recent Labs  Lab 10/27/20 1813 10/28/20 0510  NA 128* 131*  K 3.9 3.6  CL 98 100  CO2 23 23  GLUCOSE 117* 154*  BUN 17 15  CREATININE 0.51 0.53  CALCIUM 7.9* 7.9*   GFR: Estimated Creatinine Clearance: 38.3 mL/min (by C-G formula based on SCr of 0.53 mg/dL). Liver Function Tests: Recent Labs  Lab 10/28/20 0510  AST 51*  ALT 20  ALKPHOS 61  BILITOT 1.9*  PROT 5.3*  ALBUMIN 2.0*   No results for input(s): LIPASE, AMYLASE in the last 168 hours. No results for input(s): AMMONIA in the last 168 hours. Coagulation Profile: Recent Labs  Lab 10/27/20 1813  INR 3.8*   Cardiac Enzymes: No results for input(s): CKTOTAL, CKMB, CKMBINDEX, TROPONINI in the last 168 hours. BNP (last 3 results) No results for input(s): PROBNP in the last 8760 hours. HbA1C: No results for input(s): HGBA1C in the last 72 hours. CBG: No results for  input(s): GLUCAP in the last 168 hours. Lipid Profile: No results for input(s): CHOL, HDL, LDLCALC, TRIG, CHOLHDL, LDLDIRECT in the last 72 hours. Thyroid Function Tests: No results for input(s): TSH, T4TOTAL, FREET4, T3FREE, THYROIDAB in the last 72 hours. Anemia Panel: No results for input(s): VITAMINB12, FOLATE, FERRITIN, TIBC, IRON, RETICCTPCT in the last 72 hours. Sepsis Labs: No results for input(s): PROCALCITON, LATICACIDVEN in the last 168 hours.  Recent Results (from the past 240 hour(s))  Resp Panel by RT-PCR (Flu A&B, Covid) Nasopharyngeal Swab     Status: None   Collection Time: 10/27/20  7:02 PM   Specimen:  Nasopharyngeal Swab; Nasopharyngeal(NP) swabs in vial transport medium  Result Value Ref Range Status   SARS Coronavirus 2 by RT PCR NEGATIVE NEGATIVE Final    Comment: (NOTE) SARS-CoV-2 target nucleic acids are NOT DETECTED.  The SARS-CoV-2 RNA is generally detectable in upper respiratory specimens during the acute phase of infection. The lowest concentration of SARS-CoV-2 viral copies this assay can detect is 138 copies/mL. A negative result does not preclude SARS-Cov-2 infection and should not be used as the sole basis for treatment or other patient management decisions. A negative result may occur with  improper specimen collection/handling, submission of specimen other than nasopharyngeal swab, presence of viral mutation(s) within the areas targeted by this assay, and inadequate number of viral copies(<138 copies/mL). A negative result must be combined with clinical observations, patient history, and epidemiological information. The expected result is Negative.  Fact Sheet for Patients:  EntrepreneurPulse.com.au  Fact Sheet for Healthcare Providers:  IncredibleEmployment.be  This test is no t yet approved or cleared by the [place] FDA and  has been authorized for detection and/or diagnosis of SARS-CoV-2 by FDA under  an Emergency Use Authorization (EUA). This EUA will remain  in effect (meaning this test can be used) for the duration of the COVID-19 declaration under Section 564(b)(1) of the Act, 21 U.S.C.section 360bbb-3(b)(1), unless the authorization is terminated  or revoked sooner.       Influenza A by PCR NEGATIVE NEGATIVE Final   Influenza B by PCR NEGATIVE NEGATIVE Final    Comment: (NOTE) The Xpert Xpress SARS-CoV-2/FLU/RSV plus assay is intended as an aid in the diagnosis of influenza from Nasopharyngeal swab specimens and should not be used as a sole basis for treatment. Nasal washings and aspirates are unacceptable for Xpert Xpress SARS-CoV-2/FLU/RSV testing.  Fact Sheet for Patients: EntrepreneurPulse.com.au  Fact Sheet for Healthcare Providers: IncredibleEmployment.be  This test is not yet approved or cleared by the [place] FDA and has been authorized for detection and/or diagnosis of SARS-CoV-2 by FDA under an Emergency Use Authorization (EUA). This EUA will remain in effect (meaning this test can be used) for the duration of the COVID-19 declaration under Section 564(b)(1) of the Act, 21 U.S.C. section 360bbb-3(b)(1), unless the authorization is terminated or revoked.  Performed at [facility], [address], [place], [place] [address]       Radiology Studies: CT FEMUR RIGHT WO CONTRAST  Result Date: 10/28/2020 CLINICAL DATA:  Spontaneous hemorrhage.  Right leg bruising. EXAM: CT OF THE LOWER RIGHT EXTREMITY WITHOUT CONTRAST TECHNIQUE: Multidetector CT imaging of the right lower extremity was performed according to the standard protocol. COMPARISON:  None. FINDINGS: Bones/Joint/Cartilage No acute abnormality. Ligaments Suboptimally assessed by CT. Muscles and Tendons There is intramuscular hematoma primarily within the posterior compartment muscles of the right thigh, most clearly within the semimembranosus and  semitendinosus. The largest confluent area of hematoma measures 13.0 x 5.7 x 6.0 cm. Soft tissues There is diffuse subcutaneous edema of the right thigh and leg. IMPRESSION: 1. Intramuscular hematoma primarily within the posterior compartment muscles of the right thigh, most clearly within the semimembranosus and semitendinosus. The largest confluent area of hematoma measures 13.0 x 5.7 x 6.0 cm. 2. Diffuse subcutaneous edema of the right thigh and leg. Electronically Signed   By: Ulyses Jarred M.D.   On: 10/28/2020 03:12   CT TIBIA FIBULA RIGHT WO CONTRAST  Result Date: 10/28/2020 CLINICAL DATA:  Spontaneous hemorrhage.  Right leg bruising. EXAM: CT OF THE LOWER RIGHT EXTREMITY WITHOUT CONTRAST  TECHNIQUE: Multidetector CT imaging of the right lower extremity was performed according to the standard protocol. COMPARISON:  None. FINDINGS: Bones/Joint/Cartilage No acute abnormality. Ligaments Suboptimally assessed by CT. Muscles and Tendons There is intramuscular hematoma primarily within the posterior compartment muscles of the right thigh, most clearly within the semimembranosus and semitendinosus. The largest confluent area of hematoma measures 13.0 x 5.7 x 6.0 cm. Soft tissues There is diffuse subcutaneous edema of the right thigh and leg. IMPRESSION: 1. Intramuscular hematoma primarily within the posterior compartment muscles of the right thigh, most clearly within the semimembranosus and semitendinosus. The largest confluent area of hematoma measures 13.0 x 5.7 x 6.0 cm. 2. Diffuse subcutaneous edema of the right thigh and leg. Electronically Signed   By: Ulyses Jarred M.D.   On: 10/28/2020 03:12      Scheduled Meds:  atorvastatin  40 mg Oral Daily   fenofibrate  160 mg Oral Daily   levothyroxine  25 mcg Oral Q0600   metoprolol tartrate  12.5 mg Oral BID   polyethylene glycol  17 g Oral Daily   Continuous Infusions:  sodium chloride Stopped (10/27/20 2043)     LOS: 0 days      Time spent:  30 minutes   Dessa Phi, DO [facility] 10/28/2020, 11:28 AM   Available via Epic secure chat 7am-7pm After these hours, please refer to coverage provider listed on [email or web address]

## 2020-10-28 NOTE — Progress Notes (Signed)
Teresa Holmes for following warfarin  Indication: atrial fibrillation   Patient Measurements: Height: 4\' 7"  (139.7 cm) Weight: 43.1 kg (95 lb 0.3 oz) IBW/kg (Calculated) : 34  Vital Signs: Temp: 99.5 F (37.5 C) (09/27 0755) Temp Source: Oral (09/27 0755) BP: 132/52 (09/27 0755) Pulse Rate: 75 (09/27 0755)  Labs: Recent Labs    10/27/20 1813 10/28/20 0510  HGB 5.3* 7.7*  HCT 17.2* 23.2*  PLT 385 354  LABPROT 37.3*  --   INR 3.8*  --   CREATININE 0.51 0.53    Estimated Creatinine Clearance: 38.3 mL/min (by C-G formula based on SCr of 0.53 mg/dL).   Medical History: Past Medical History:  Diagnosis Date   Aneurysm ([place])    Anxiety    Arthritis    Cancer (HCC)    CHF (congestive heart failure) (HCC)    COPD (chronic obstructive pulmonary disease) (HCC)    Hyperlipidemia    Hypertension    Stroke (HCC)    Thyroid disease     Medications:  Scheduled:   atorvastatin  40 mg Oral Daily   fenofibrate  160 mg Oral Daily   levothyroxine  25 mcg Oral Q0600   metoprolol tartrate  12.5 mg Oral BID   polyethylene glycol  17 g Oral Daily    Assessment: 70 y.o. female with medical history significant of chronic anticoagulation secondary to atrial fibrillation with warfarin who presented to the ER because of concern for bruising of her right leg from the thigh all the way to the calf region.  She was admitted with symptomatic anemia with significant hematoma and a supratherapeutic INR.  Home warfarin regimen: 1 mg daily except Tuesday and Friday she  takes coumadin 2 mg. A recent therapeutic INR in August 2022 confirms this regimen is appropriate  DDIs: none significant at this time  Goal of Therapy:  INR 2-3 Monitor platelets by anticoagulation protocol: Yes   Plan:  Continue to hold warfarin until MD is ready to restart Daily INR  Dallie Piles 10/28/2020,11:59 AM

## 2020-10-28 NOTE — Plan of Care (Signed)

## 2020-10-29 ENCOUNTER — Encounter: Payer: Self-pay | Admitting: Internal Medicine

## 2020-10-29 DIAGNOSIS — D62 Acute posthemorrhagic anemia: Secondary | ICD-10-CM | POA: Diagnosis not present

## 2020-10-29 LAB — PROTIME-INR
INR: 2.9 — ABNORMAL HIGH (ref 0.8–1.2)
Prothrombin Time: 30.5 seconds — ABNORMAL HIGH (ref 11.4–15.2)

## 2020-10-29 LAB — CBC
HCT: 25.1 % — ABNORMAL LOW (ref 36.0–46.0)
Hemoglobin: 7.9 g/dL — ABNORMAL LOW (ref 12.0–15.0)
MCH: 28.8 pg (ref 26.0–34.0)
MCHC: 31.5 g/dL (ref 30.0–36.0)
MCV: 91.6 fL (ref 80.0–100.0)
Platelets: 378 10*3/uL (ref 150–400)
RBC: 2.74 MIL/uL — ABNORMAL LOW (ref 3.87–5.11)
RDW: 19 % — ABNORMAL HIGH (ref 11.5–15.5)
WBC: 6.8 10*3/uL (ref 4.0–10.5)
nRBC: 0 % (ref 0.0–0.2)

## 2020-10-29 NOTE — Assessment & Plan Note (Signed)
Continue levothyroxine 

## 2020-10-29 NOTE — Evaluation (Signed)
Occupational Therapy Evaluation Patient Details Name: Teresa Holmes MRN: [id number] DOB: 13-Feb-1950 Today's Date: 10/29/2020   History of Present Illness 70 y.o. female with medical history significant of chronic anticoagulation secondary to atrial fibrillation with warfarin, diastolic dysfunction CHF, COPD, essential hypertension, previous CVA, hypothyroidism, osteoarthritis and anxiety disorder who presents to the ER because of concern for bruising of her right leg from the thigh all the way to the calf region.  This has been gradually getting worse over the last 1 week.  Associated with significant pain.  Denied any fever or chills denied any injury.  Patient is on anticoagulation and has not seen any bleeding as well.  Patient noted to have significant drop in her hemoglobin and a huge hematoma in the right thigh to the calf.  Patient is literally bedbound for the most part.  Previous multiple leg surgeries.   Clinical Impression   Patient presenting with decreased Ind in self care,balance, functional mobility/transfers, endurance, and safety awareness. Patient reports being mod I with all aspects of self care and functional mobility with use of SPC PTA. However, pt does appear to have some confusion and per chart review and staff reports pt has paid caregiver to assist with all aspects of care and primarily transfers to wheelchair during the day. Pt appears to have L LE contracture and unable to utilize L UE functionally from prior CVA. Pt needing total A to EOB and crying out in pain. Pt needing assistance to set up meal tray and open all containers to feed self. Patient will benefit from acute OT to increase overall independence in the areas of ADLs, functional mobility, and safety awareness in order to safely discharge home with caregiver. OT does recommend 24/7 supervision/assist at discharge and pt possibility need hospital bed to reduce caregiver burden if she does not have one. No one present  to confirm home set up or equipment needs.      Recommendations for follow up therapy are one component of a multi-disciplinary discharge planning process, led by the attending physician.  Recommendations may be updated based on patient status, additional functional criteria and insurance authorization.   Follow Up Recommendations  Home health OT;Supervision/Assistance - 24 hour    Equipment Recommendations  Hospital bed       Precautions / Restrictions Precautions Precautions: Fall      Mobility Bed Mobility Overal bed mobility: Needs Assistance Bed Mobility: Supine to Sit;Sit to Supine     Supine to sit: Total assist Sit to supine: Total assist   General bed mobility comments: pt calling out in pain with any movement of LEs    Transfers                 General transfer comment: deferred secondary to safety and pt refusal        ADL either performed or assessed with clinical judgement   ADL Overall ADL's : Needs assistance/impaired                                       General ADL Comments: min- mod A for UB self care and total A for LB care. Unable to safely transfer pt this session.     Vision Patient Visual Report: No change from baseline              Pertinent Vitals/Pain Pain Assessment: No/denies pain     Hand Dominance  Right   Extremity/Trunk Assessment Upper Extremity Assessment Upper Extremity Assessment: LUE deficits/detail LUE Deficits / Details: deficits from prior CVA with 0/5 strength and significant edema noted.   Lower Extremity Assessment Lower Extremity Assessment: LLE deficits/detail;RLE deficits/detail RLE: Unable to fully assess due to pain LLE Deficits / Details: contracture noted and pt crying out in pain when OT attempts to range       Communication Communication Communication: No difficulties   Cognition Arousal/Alertness: Awake/alert Behavior During Therapy: WFL for tasks  assessed/performed Overall Cognitive Status: No family/caregiver present to determine baseline cognitive functioning                                 General Comments: Pt oriented to self and knew she was in hospital. She is not oriented to time and provided conflicting home set up information.              Home Living Family/patient expects to be discharged to:: Private residence Living Arrangements: Other (Comment) Available Help at Discharge: Personal care attendant;Available PRN/intermittently (caregiver does live there but not always present at nights) Type of Home: House Home Access: Ramped entrance     Home Layout: Two level;Able to live on main level with bedroom/bathroom     Bathroom Shower/Tub: Teacher, early years/pre: Standard     Home Equipment: Cane - quad;Bedside commode;Shower seat;Wheelchair - manual   Additional Comments: Pt has person care attendant who comes daily but unsure how many hour a day she is available to assist. Per staff report, she does not always spend the night.      Prior Functioning/Environment Level of Independence: Needs assistance        Comments: Pt reports she is ambulatory with quad cane and transfers to wheelchair during the day. No one present to confirm and pt appears to be confused when asked orientation questions.        OT Problem List: Pain;Decreased strength;Decreased activity tolerance;Decreased safety awareness;Impaired balance (sitting and/or standing);Decreased knowledge of use of DME or AE;Decreased knowledge of precautions;Decreased coordination;Decreased cognition      OT Treatment/Interventions: Self-care/ADL training;Therapeutic exercise;Therapeutic activities;DME and/or AE instruction;Manual therapy;Balance training;Patient/family education;Energy conservation;Cognitive remediation/compensation    OT Goals(Current goals can be found in the care plan section) Acute Rehab OT Goals Patient  Stated Goal: to go home OT Goal Formulation: With patient Time For Goal Achievement: 11/12/20 Potential to Achieve Goals: Good ADL Goals Pt Will Perform Grooming: sitting;with set-up Pt Will Perform Lower Body Dressing: sit to/from stand;with min assist Pt Will Transfer to Toilet: with min assist;squat pivot transfer Pt Will Perform Toileting - Clothing Manipulation and hygiene: with min assist  OT Frequency: Min 2X/week   Barriers to D/C: Other (comment)  Pt need 24/7 supervision/assist          AM-PAC OT "6 Clicks" Daily Activity     Outcome Measure Help from another person eating meals?: A Little Help from another person taking care of personal grooming?: A Little Help from another person toileting, which includes using toliet, bedpan, or urinal?: Total Help from another person bathing (including washing, rinsing, drying)?: A Lot Help from another person to put on and taking off regular upper body clothing?: A Lot Help from another person to put on and taking off regular lower body clothing?: Total 6 Click Score: 12   End of Session Nurse Communication: Mobility status  Activity Tolerance: Patient limited by pain Patient left:  with call bell/phone within reach;in bed  OT Visit Diagnosis: Unsteadiness on feet (R26.81);Repeated falls (R29.6);Muscle weakness (generalized) (M62.81)                Time: 0932-1000 OT Time Calculation (min): 28 min Charges:  OT General Charges $OT Visit: 1 Visit OT Evaluation $OT Eval Moderate Complexity: 1 Mod OT Treatments $Self Care/Home Management : 8-22 mins  Darleen Crocker, MS, OTR/L , CBIS ascom [phone]  10/29/20, 1:21 PM

## 2020-10-29 NOTE — Progress Notes (Signed)
  Progress Note    Teresa Holmes   ZSW:[id number]  DOB: 1950-02-19  DOA: 10/27/2020     1 Date of Service: 10/29/2020     Subjective:  No loss of sensation in her right foot.  No increase in pain of her right thigh.  No increase in size of her right thigh.  No confusion.  No fever.  Hospital Problems Hematoma See above  Acute blood loss anemia Thigh stable since yesterday, no indication for intervention. - Hold warfarin - Monitor right thigh - Daily INR  AF (paroxysmal atrial fibrillation) (HCC) - Hold warfarin - Continue metoprolol  CVA, old, hemiparesis ([place]) History of left hemiparesis.  INR remains therapeutic. - Continue atorvastatin, fibrate  Hypothyroidism - Continue levothyroxine     Objective Vital signs were reviewed and unremarkable.  Vitals:   10/29/20 0519 10/29/20 0829 10/29/20 1627 10/29/20 1938  BP: 105/76 (!) 144/61 (!) 123/47 108/67  Pulse: 69 73 71 83  Resp: 17 18 18 18   Temp: 98.2 F (36.8 C) 98.1 F (36.7 C) 98.2 F (36.8 C) 98.6 F (37 C)  TempSrc:  Oral Oral Oral  SpO2: 100% 98% 99% 100%  Weight:      Height:       43.1 kg  Exam Physical Exam Constitutional:      Appearance: She is not toxic-appearing.  HENT:     Nose: Nose normal. No congestion or rhinorrhea.     Mouth/Throat:     Mouth: Mucous membranes are moist.     Pharynx: Oropharynx is clear. No oropharyngeal exudate.  Cardiovascular:     Rate and Rhythm: Normal rate and regular rhythm.     Heart sounds: Normal heart sounds. No murmur heard.   No gallop.  Pulmonary:     Effort: Pulmonary effort is normal.     Breath sounds: No wheezing or rales.  Abdominal:     Palpations: Abdomen is soft.     Tenderness: There is no abdominal tenderness. There is no guarding.  Skin:    General: Skin is warm and dry.     Comments: Ecchymosis on the right thigh, no increase in size from yesterday.  Neurological:     Mental Status: She is alert.     Motor: Weakness and atrophy  present.     Comments: Speech fluent, face symmetric  Psychiatric:        Attention and Perception: Attention and perception normal.        Mood and Affect: Affect normal.        Cognition and Memory: Memory is impaired.       Labs / Other Information My review of labs, imaging, notes and other tests is significant for Stable hemoglobin posttransfusion yesterday.     Time spent: 25 minutes [facility] 10/29/2020, 9:20 PM

## 2020-10-29 NOTE — Assessment & Plan Note (Addendum)
Thigh stable since yesterday, no indication for intervention. - Hold warfarin - Monitor right thigh - Daily INR

## 2020-10-29 NOTE — Assessment & Plan Note (Signed)
See above

## 2020-10-29 NOTE — Progress Notes (Signed)
Mobility Specialist - Progress Note   10/29/20 1400  Mobility  Activity Pillow/wedge support  Level of Assistance Dependent, patient does less than 25%  Mobility Sit up in bed/chair position for meals  Mobility Response Tolerated well  Mobility performed by Mobility specialist  $Mobility charge 1 Mobility    Pt calling out for assistance to be repositioned in bed. Slouched over onto R side. Pt repositioned to [facility] and centered with pillow wedge support provided. Lunch tray placed in front of pt.    Kathee Delton Mobility Specialist 10/29/20, 2:16 PM

## 2020-10-29 NOTE — Assessment & Plan Note (Signed)
-   Hold warfarin - Continue metoprolol

## 2020-10-29 NOTE — Assessment & Plan Note (Addendum)
History of left hemiparesis.  INR remains therapeutic. - Continue atorvastatin, fibrate

## 2020-10-29 NOTE — Evaluation (Signed)
Physical Therapy Evaluation Patient Details Name: Teresa Holmes MRN: [id number] DOB: [birth year] Today's Date: 10/29/2020  History of Present Illness  70 y.o. female with medical history significant of chronic anticoagulation secondary to atrial fibrillation with warfarin, diastolic dysfunction CHF, COPD, essential hypertension, previous CVA, hypothyroidism, osteoarthritis and anxiety disorder who presents to the ER because of concern for bruising of her right leg from the thigh all the way to the calf region.  This has been gradually getting worse over the last 1 week.  Associated with significant pain.  Denied any fever or chills denied any injury.  Patient is on anticoagulation and has not seen any bleeding as well.  Patient noted to have significant drop in her hemoglobin and a huge hematoma in the right thigh to the calf.  Patient is literally bedbound for the most part.  Previous multiple leg surgeries.  Clinical Impression  Pt received supine in bed with rightward trunk lean against bed rail and asking for PT to reposition her. Once repositioned, pt agreeable to therapy. Dr. Loleta Books present throughout beginning of session to observe mobility; unfortunately he had to leave prior to attempted transfer to attend rounds. PT did speak with pt caregiver, [name] to verify home set up and level of care giving being provided. With assist, pt was able to perform transfers to the [facility] and w/c; pt was not ambulatory.   Pt required total assist to manage BLE, LUE and trunk to achieve sitting EOB. Movements performed slow and with rest breaks as pt reported pain with all mobility. Sitting balance also required total assist at EOB. PT initiated transfer by angling hips to preferred positioning at EOB prior to performing the full squat-pivot transfer to the w/c. Pt refused remainder of transfer due to pain. Pt returned to bed, RN assisted with boosting and pt hygiene/clean-up as pt unknowingly had incontinence of  B&B. Due to report of performing transfers prior to hospital admission, PT is rec HHPT to return to that level of mobility. Caregiver is aware of pt mobility currently being bed level and is agreeable to caring for pt at home.       Recommendations for follow up therapy are one component of a multi-disciplinary discharge planning process, led by the attending physician.  Recommendations may be updated based on patient status, additional functional criteria and insurance authorization.  Follow Up Recommendations Home health PT;Supervision/Assistance - 24 hour;Supervision for mobility/OOB    Equipment Recommendations  None recommended by PT (unable to perform squat pivot - may consider Hoyer lift if pt and caregiver are interested for safer transfers)    Recommendations for Other Services       Precautions / Restrictions Precautions Precautions: Fall Restrictions Weight Bearing Restrictions: No Other Position/Activity Restrictions: contracture of LLE and swelling of LUE with no palpable muscle contraction - both 2/2 previous stroke in 2009      Mobility  Bed Mobility Overal bed mobility: Needs Assistance Bed Mobility: Supine to Sit;Sit to Supine;Rolling Rolling: Max assist   Supine to sit: Total assist Sit to supine: Total assist   General bed mobility comments: Pt unable to participate. PT encouraged participation with VC on sequencing with no motor response. Pt did participate in rolling - increased difficulty to R due to L-sided hemiplegia (MOD A to the L and MAX A to the R)    Transfers                 General transfer comment: Squat pivot transfer attempted - required  total assist to maintain sitting EOB. First boost to angle hips prior to transfer was performed with pain in BLE and pt refusing remainder of transfer.  Ambulation/Gait             General Gait Details: deferred for safety due to significant LLE contracture  Stairs            Wheelchair  Mobility    Modified Rankin (Stroke Patients Only)       Balance Overall balance assessment: Needs assistance Sitting-balance support: Single extremity supported;Feet supported Sitting balance-Leahy Scale: Zero Sitting balance - Comments: RUE on bed rail; total assist to maintain sitting balance EOB due to forward and rightward trunk lean Postural control: Right lateral lean     Standing balance comment: standing not attempted                             Pertinent Vitals/Pain Pain Assessment: 0-10 Pain Score: 2  Pain Location: RLE Pain Descriptors / Indicators: Aching;Discomfort    Home Living Family/patient expects to be discharged to:: Private residence Living Arrangements: Other (Comment) (caregiver lives with patient however occasionally leaves at night after pt is in bed - caregiver states all needs are within reach) Available Help at Discharge: Personal care attendant;Available PRN/intermittently (caregiver does live there but not always present at nights) Type of Home: House Home Access: Ramped entrance     Home Layout: Two level;Able to live on main level with bedroom/bathroom Home Equipment: Cane - quad;Bedside commode;Shower seat;Wheelchair - manual Additional Comments: Pt has personal care attendant who comes daily and spends all waking hours with patient. Per telephone conversation with care attendant ([name]), she leaves at night occasionally after pt has gone to bed and returns in the morning.    Prior Function Level of Independence: Needs assistance         Comments: Pt states she has not transferred to a w/c in over a month however she has been transferring to [facility] (according to pt and caregiver). Pt appears confused - this report differs from report of ambulating with quad cane pt told OT earlier this morning.     Hand Dominance   Dominant Hand: Right    Extremity/Trunk Assessment   Upper Extremity Assessment Upper Extremity  Assessment: LUE deficits/detail LUE Deficits / Details: deficits from prior CVA with 0/5 strength and significant edema noted.    Lower Extremity Assessment Lower Extremity Assessment: LLE deficits/detail RLE Deficits / Details: generalized weakness, less than 3/5 MMT as pt was unable to lift against gravity or through full ROM of DF RLE: Unable to fully assess due to pain LLE Deficits / Details: Significant contracture at L hip and knee with pain when PT attempts to range LLE: Unable to fully assess due to pain    Cervical / Trunk Assessment Cervical / Trunk Assessment: Kyphotic  Communication   Communication: No difficulties  Cognition Arousal/Alertness: Awake/alert Behavior During Therapy: WFL for tasks assessed/performed Overall Cognitive Status: No family/caregiver present to determine baseline cognitive functioning                                 General Comments: Pt oriented to self and knew she was in hospital (although she states [place]). She also knows the current president. She is not oriented to time (stated 2033) and provided conflicting home set up information.      General  Comments General comments (skin integrity, edema, etc.): edema throughout LUE, shiny skin. Bruising on RLE, thigh through calf.    Exercises     Assessment/Plan    PT Assessment All further PT needs can be met in the next venue of care  PT Problem List Decreased strength;Decreased range of motion;Decreased knowledge of use of DME;Decreased activity tolerance;Decreased safety awareness;Decreased skin integrity;Decreased knowledge of precautions;Decreased balance;Decreased mobility;Impaired sensation       PT Treatment Interventions      PT Goals (Current goals can be found in the Care Plan section)  Acute Rehab PT Goals Patient Stated Goal: to go home PT Goal Formulation: With patient Time For Goal Achievement: 11/12/20 Potential to Achieve Goals: Fair    Frequency      Barriers to discharge        Co-evaluation               AM-PAC PT "6 Clicks" Mobility  Outcome Measure Help needed turning from your back to your side while in a flat bed without using bedrails?: A Lot Help needed moving from lying on your back to sitting on the side of a flat bed without using bedrails?: Total Help needed moving to and from a bed to a chair (including a wheelchair)?: Total Help needed standing up from a chair using your arms (e.g., wheelchair or bedside chair)?: Total Help needed to walk in hospital room?: Total Help needed climbing 3-5 steps with a railing? : Total 6 Click Score: 7    End of Session Equipment Utilized During Treatment: Gait belt Activity Tolerance: Patient limited by pain;Patient limited by lethargy;Patient limited by fatigue Patient left: in bed;with bed alarm set;with call bell/phone within reach Nurse Communication: Mobility status;Precautions PT Visit Diagnosis: Unsteadiness on feet (R26.81);Muscle weakness (generalized) (M62.81);Difficulty in walking, not elsewhere classified (R26.2);Hemiplegia and hemiparesis Hemiplegia - Right/Left: Left Hemiplegia - dominant/non-dominant: Non-dominant Hemiplegia - caused by: Cerebral infarction    Time: 0100-7121 PT Time Calculation (min) (ACUTE ONLY): 39 min   Charges:   PT Evaluation $PT Eval Moderate Complexity: 1 Mod PT Treatments $Therapeutic Activity: 23-37 mins       Patrina Levering PT, DPT 10/29/20 2:53 PM [phone]   Ramonita Lab 10/29/2020, 2:42 PM

## 2020-10-29 NOTE — Progress Notes (Signed)
[place] for following warfarin  Indication: atrial fibrillation   Patient Measurements: Height: 4\' 7"  (139.7 cm) Weight: 43.1 kg (95 lb 0.3 oz) IBW/kg (Calculated) : 34  Vital Signs: Temp: 98.2 F (36.8 C) (09/28 0519) BP: 105/76 (09/28 0519) Pulse Rate: 69 (09/28 0519)  Labs: Recent Labs    10/27/20 1813 10/28/20 0510 10/29/20 0447  HGB 5.3* 7.7* 7.9*  HCT 17.2* 23.2* 25.1*  PLT 385 354 378  LABPROT 37.3*  --  30.5*  INR 3.8*  --  2.9*  CREATININE 0.51 0.53  --      Estimated Creatinine Clearance: 38.3 mL/min (by C-G formula based on SCr of 0.53 mg/dL).   Medical History: Past Medical History:  Diagnosis Date   Aneurysm ([place])    Anxiety    Arthritis    Cancer (HCC)    CHF (congestive heart failure) (HCC)    COPD (chronic obstructive pulmonary disease) (HCC)    Hyperlipidemia    Hypertension    Stroke (HCC)    Thyroid disease     Medications:  Scheduled:   atorvastatin  40 mg Oral Daily   fenofibrate  160 mg Oral Daily   levothyroxine  25 mcg Oral Q0600   metoprolol tartrate  12.5 mg Oral BID   polyethylene glycol  17 g Oral Daily    Assessment: 70 y.o. female with medical history significant of chronic anticoagulation secondary to atrial fibrillation with warfarin who presented to the ER because of concern for bruising of her right leg from the thigh all the way to the calf region.  She was admitted with symptomatic anemia with significant hematoma and a supratherapeutic INR.  Home warfarin regimen: 1 mg daily except Tuesday and Friday she  takes coumadin 2 mg. A recent therapeutic INR in August 2022 confirms this regimen is appropriate  DDIs: none significant at this time  Goal of Therapy:  INR 2-3 Monitor platelets by anticoagulation protocol: Yes   Plan:  Continue to hold warfarin until MD is ready to restart Daily INR  Dallie Piles 10/29/2020,7:07 AM

## 2020-10-29 NOTE — TOC Initial Note (Addendum)
Transition of Care [facility]) - Initial/Assessment Note    Patient Details  Name: Teresa Holmes MRN: [id number] Date of Birth: 1950/07/25  [facility]) CM/SW Contact:    Eileen Stanford, LCSW Phone Number: 10/29/2020, 2:22 PM  Clinical Narrative:    CSW spoke with pt's Caregiver/POA. Pt lives with her. Pt's Caregiver states pt has a PCP at [facility] and they arrange transport for pt prior to appointments. Pt's Caregiver states they will continue doing that after dc. Pt's Caregiver is agreeable to [facility] and is aware that if pt is not able to participate they will stop services. Pt's Caregiver states pt needs a new Nebulizer machine and a new cane. Pt's Caregiver also states that she will be out of town tonight due to family death and will be back in the AM. MD is aware.              [facility] will service. PT, OT, RN.   Expected Discharge Plan: [place] Barriers to Discharge: Continued Medical Work up   Patient Goals and CMS Choice Patient states their goals for this hospitalization and ongoing recovery are:: for pt to get better   Choice offered to / list presented to : [facility] POA / Guardian  Expected Discharge Plan and Services Expected Discharge Plan: [place] In-house Referral: Clinical Social Work   Post Acute Care Choice: [place] arrangements for the past 2 months: [place]                 DME Arranged: Kasandra Knudsen, Nebulizer machine DME Agency: [employer] Date DME Agency Contacted: 10/29/20 Time DME Agency Contacted: [phone] Representative spoke with at DME Agency: [place]: PT [place]: [place] ([place]) Date [place]: 10/29/20 Time [place]: 1413 Representative spoke with at [place]  Prior Living Arrangements/Services Living arrangements for the past 2 months: [place] with:: Other (Comment) (caregiver) Patient language and need for  interpreter reviewed:: Yes Do you feel safe going back to the place where you live?: Yes      Need for Family Participation in Patient Care: Yes (Comment) Care giver support system in place?: Yes (comment)   Criminal Activity/Legal Involvement Pertinent to Current Situation/Hospitalization: No - Comment as needed  Activities of Daily Living Home Assistive Devices/Equipment: [place] Hospital bed ADL Screening (condition at time of admission) Patient's cognitive ability adequate to safely complete daily activities?: Yes Is the patient deaf or have difficulty hearing?: Yes Does the patient have difficulty seeing, even when wearing glasses/contacts?: No Does the patient have difficulty concentrating, remembering, or making decisions?: No Patient able to express need for assistance with ADLs?: Yes Does the patient have difficulty dressing or bathing?: Yes Independently performs ADLs?: No Communication: Independent Dressing (OT): Needs assistance Is this a change from baseline?: Pre-admission baseline Grooming: Needs assistance Is this a change from baseline?: Pre-admission baseline Feeding: Needs assistance Is this a change from baseline?: Change from baseline, expected to last >3 days Bathing: Dependent Is this a change from baseline?: Pre-admission baseline Toileting: Dependent Is this a change from baseline?: Pre-admission baseline In/Out Bed: Needs assistance, Dependent Is this a change from baseline?: Pre-admission baseline Walks in Home: Dependent Is this a change from baseline?: Pre-admission baseline Does the patient have difficulty walking or climbing stairs?: Yes Weakness of Legs: Both Weakness of Arms/Hands: Both  Permission Sought/Granted Permission sought to share information with : Family  Supports    Share Information with NAME: [name]     Permission granted to share info w Relationship: caregiver/POA     Emotional Assessment Appearance:: Appears stated  age Attitude/Demeanor/Rapport: Unable to Assess Affect (typically observed): Unable to Assess Orientation: : Oriented to Self, Oriented to Place, Oriented to  Time, Oriented to Situation Alcohol / Substance Use: Not Applicable Psych Involvement: No (comment)  Admission diagnosis:  Anemia, unspecified type [D64.9] ABLA (acute blood loss anemia) [D62] Acute blood loss anemia [D62] Patient Active Problem List   Diagnosis Date Noted   ABLA (acute blood loss anemia) 10/27/2020   Hip fracture (HCC) 11/10/2019   Closed rib fracture 11/10/2019   Acute blood loss anemia 11/10/2019   AF (paroxysmal atrial fibrillation) ([place]) 11/10/2019   CVA, old, hemiparesis ([place]) 11/10/2019   Wound of skin 11/10/2019   Pulmonary emphysema ([place]) 10/23/2019   Pelvic fracture ([place]) 09/28/2018   Encounter for long-term (current) use of medications 05/11/2017   Chronic pain disorder 05/11/2017   Generalized anxiety disorder 03/14/2017   Essential (primary) hypertension 03/14/2017   Hemiplegia and hemiparesis following cerebral infarction affecting left non-dominant side ([place]) 03/14/2017   Vitamin D deficiency, unspecified 03/14/2017   Personal history of venous thrombosis and embolism 03/14/2017   Congenital spondylolisthesis 03/14/2017   Nicotine dependence, cigarettes, uncomplicated 87/56/4332   Mixed hyperlipidemia 03/14/2017   Long term (current) use of anticoagulants 03/14/2017   Other intervertebral disc degeneration, thoracolumbar region 03/14/2017   Lumbago with sciatica 03/14/2017   Cardiac arrhythmia 03/14/2017   Myalgia 03/14/2017   Hypothyroidism 03/14/2017   Difficulty walking 03/14/2017   Left knee pain 03/14/2017   PCP:  Pcp, No Pharmacy:   [facility], [address] [place] [address] Phone: [phone] Fax: [phone]     Social Determinants of Health (SDOH) Interventions    Readmission Risk Interventions No flowsheet  data found.

## 2020-10-30 DIAGNOSIS — D62 Acute posthemorrhagic anemia: Secondary | ICD-10-CM | POA: Diagnosis not present

## 2020-10-30 LAB — COMPREHENSIVE METABOLIC PANEL
ALT: 25 U/L (ref 0–44)
AST: 51 U/L — ABNORMAL HIGH (ref 15–41)
Albumin: 2 g/dL — ABNORMAL LOW (ref 3.5–5.0)
Alkaline Phosphatase: 72 U/L (ref 38–126)
Anion gap: 6 (ref 5–15)
BUN: 20 mg/dL (ref 8–23)
CO2: 25 mmol/L (ref 22–32)
Calcium: 8.2 mg/dL — ABNORMAL LOW (ref 8.9–10.3)
Chloride: 97 mmol/L — ABNORMAL LOW (ref 98–111)
Creatinine, Ser: 0.56 mg/dL (ref 0.44–1.00)
GFR, Estimated: >60 mL/min (ref >60)
Glucose, Bld: 102 mg/dL — ABNORMAL HIGH (ref 70–99)
Potassium: 4.9 mmol/L (ref 3.5–5.1)
Sodium: 128 mmol/L — ABNORMAL LOW (ref 135–145)
Total Bilirubin: 1.4 mg/dL — ABNORMAL HIGH (ref 0.3–1.2)
Total Protein: 5.5 g/dL — ABNORMAL LOW (ref 6.5–8.1)

## 2020-10-30 LAB — CBC
HCT: 25.6 % — ABNORMAL LOW (ref 36.0–46.0)
Hemoglobin: 8 g/dL — ABNORMAL LOW (ref 12.0–15.0)
MCH: 29.4 pg (ref 26.0–34.0)
MCHC: 31.3 g/dL (ref 30.0–36.0)
MCV: 94.1 fL (ref 80.0–100.0)
Platelets: 388 10*3/uL (ref 150–400)
RBC: 2.72 MIL/uL — ABNORMAL LOW (ref 3.87–5.11)
RDW: 18.4 % — ABNORMAL HIGH (ref 11.5–15.5)
WBC: 7.5 10*3/uL (ref 4.0–10.5)
nRBC: 0 % (ref 0.0–0.2)

## 2020-10-30 LAB — PROTIME-INR
INR: 1.8 — ABNORMAL HIGH (ref 0.8–1.2)
Prothrombin Time: 20.9 seconds — ABNORMAL HIGH (ref 11.4–15.2)

## 2020-10-30 MED ORDER — FERROUS SULFATE 325 (65 FE) MG PO TBEC: 325.0000 mg | DELAYED_RELEASE_TABLET | ORAL | 3 refills | Status: AC

## 2020-10-30 MED ORDER — WARFARIN SODIUM 1 MG PO TABS
1.0000 mg | ORAL_TABLET | Freq: Once | ORAL | Status: AC
  Administered 2020-10-30: 1 mg via ORAL
  Filled 2020-10-30: qty 1

## 2020-10-30 MED ORDER — WARFARIN - PHARMACIST DOSING INPATIENT: Freq: Every day | Status: DC

## 2020-10-30 NOTE — Progress Notes (Signed)
[place] for following warfarin  Indication: atrial fibrillation   Patient Measurements: Height: 4\' 7"  (139.7 cm) Weight: 43.1 kg (95 lb 0.3 oz) IBW/kg (Calculated) : 34  Vital Signs: Temp: 98.9 F (37.2 C) (09/29 0836) BP: 134/66 (09/29 0836) Pulse Rate: 79 (09/29 0836)  Labs: Recent Labs    10/27/20 1813 10/28/20 0510 10/29/20 0447 10/30/20 0528  HGB 5.3* 7.7* 7.9* 8.0*  HCT 17.2* 23.2* 25.1* 25.6*  PLT 385 354 378 388  LABPROT 37.3*  --  30.5* 20.9*  INR 3.8*  --  2.9* 1.8*  CREATININE 0.51 0.53  --  0.56     Estimated Creatinine Clearance: 38.3 mL/min (by C-G formula based on SCr of 0.56 mg/dL).   Medical History: Past Medical History:  Diagnosis Date   Aneurysm ([place])    Anxiety    Arthritis    Cancer (HCC)    CHF (congestive heart failure) (HCC)    COPD (chronic obstructive pulmonary disease) (HCC)    Hyperlipidemia    Hypertension    Stroke (HCC)    Thyroid disease     Medications:  Scheduled:   atorvastatin  40 mg Oral Daily   fenofibrate  160 mg Oral Daily   levothyroxine  25 mcg Oral Q0600   metoprolol tartrate  12.5 mg Oral BID   polyethylene glycol  17 g Oral Daily    Assessment: 70 y.o. female with medical history significant of chronic anticoagulation secondary to atrial fibrillation with warfarin who presented to the ER because of concern for bruising of her right leg from the thigh all the way to the calf region.  She was admitted with symptomatic anemia with significant hematoma and a supratherapeutic INR.  Home warfarin regimen: 1 mg daily except Tuesday and Friday she  takes coumadin 2 mg. A recent therapeutic INR in August 2022 confirms this regimen is appropriate  DDIs: none significant at this time  Date INR Warfarin Dose  9/29 1.8 1 mg          Goal of Therapy:  INR 2-3 Monitor platelets by anticoagulation protocol: Yes   Plan:  INR is subtherapeutic. Will restart warfarin at home  dose. Will give warfarin 1 mg x 1 tonight. Recheck INR daily and CBC at least every 3 days.   Discharge recommendation: restart warfarin with home regimen with outpatient follow up.   Oswald Hillock, PharmD, BCPS 10/30/2020,8:39 AM

## 2020-10-30 NOTE — TOC Transition Note (Signed)
Transition of Care [facility]) - CM/SW Discharge Note   Patient Details  Name: MONZERRATH MCBURNEY MRN: [id number] Date of Birth: 1950/02/21  Transition of Care [facility]) CM/SW Contact:  Candie Chroman, LCSW Phone Number: 10/30/2020, 4:19 PM   Clinical Narrative:  Patient has orders to discharge home today. [facility] representative is aware. EMS transport has been arranged and she is 4th on the list. Asked RN to call caregiver when they get here to pick her up. No further concerns. CSW signing off.   Final next level of care: Home w Home Health Services Barriers to Discharge: Barriers Resolved   Patient Goals and CMS Choice Patient states their goals for this hospitalization and ongoing recovery are:: for pt to get better   Choice offered to / list presented to : [facility] POA / Guardian  Discharge Placement                Patient to be transferred to facility by: EMS Name of family member notified: Ree Edman Patient and family notified of of transfer: 10/30/20  Discharge Plan and Services In-house Referral: Clinical Social Work   Post Acute Care Choice: Home Health          DME Arranged: Nebulizer machine, [place] DME Agency: [employer] Date DME Agency Contacted: 10/30/20 Time DME Agency Contacted: [phone] Representative spoke with at DME Agency: Suanne Marker HH Arranged: RN [place] Agency: [place] ([place]) Date [place]: 10/30/20 Time [place]: 1413 Representative spoke with at [place]: [place] (SDOH) Interventions     Readmission Risk Interventions No flowsheet data found.

## 2020-10-30 NOTE — Discharge Summary (Signed)
Physician Discharge Summary  Teresa Holmes WLN:[id number] DOB: 1950-02-08 DOA: 10/27/2020  PCP: [facility]  Admit date: 10/27/2020 Discharge date: 10/30/2020  Admitted From: Home  Disposition:  Home with [facility]   Recommendations for Outpatient Follow-up:  Follow up with [facility] PCP in 1 week [place]: Please obtain CBC and INR in one week     Home Health: PT/OT/RN due to bedbound status  Equipment/Devices: None new  Discharge Condition: Stable  CODE STATUS: DNR Diet recommendation: Heart healthy  Brief/Interim Summary: Teresa Holmes is a 70 y.o. F with Afib on warfarin, hx CVA with left hemiparesis, bedbound at baseline, COPD, dCHF, HTN, and hypothyroidism who presented with right leg bruise and pain.  This started spontaneously 1 week ago, has gradually been getting worse, spreading and associated with significant pain.  In the ER, she was found to be anemic to 5.3 g/dL and INR 3.8.       PRINCIPAL HOSPITAL DIAGNOSIS: Acute blood loss anemia due to spontaneous thigh hematoma    Discharge Diagnoses:   Acute blood loss anemia Patient was admitted and transfused 1 unit.  Her hemoglobin came up to 8 remained stable.  Spontaneous thigh hematoma The patient was admitted, and CT thigh and US of the right leg showed a thigh hematoma.  This was observed for 48 hours, had no increase in size, no evidence of compartment syndrome or neurovascular compromise, and she had tolerable discomfort without any analgesics.  No intervention necessary, the hematoma has likely tamponaded itself, her anticoagulation was not interrupted and this appears stable.    Continue anticoagulation, follow weekly as an outpatient.    Supratherapeutic INR Warfarin held, INR dropped to the therapeutic range. Warfarin resumed on discharge.  Check INR with goal 2-3 in the next week.  Paroxysmal atrial fibrillation  Cerebrovascular disease with chronic left  hemiparesis  Hypothyroidism        Discharge Instructions  Discharge Instructions     Diet - low sodium heart healthy   Complete by: As directed    Discharge instructions   Complete by: As directed    From Dr. Loleta Books: You were admitted for a hematoma in the leg. A "hematoma" is a small blood collection or blood pocket. It is contained. It is small, and does not appear to be growing.  Continue your warfarin. This blood collection will slowly get better over time, probably weeks. In a worst case, it would gradually get larger, and be very pain and might put pressure on the blood vessels or the nerves in the leg. You would know this because it would be obviously larger, more painful and you would have numbness in the foot and inability to move the foot   Go see your primary in 1 week Have them check your INR and your blood level  Take iron to replace the blood you lost Take iron (with a stool softener docusate) every other day until your doctor tells you to stop   Increase activity slowly   Complete by: As directed       Allergies as of 10/30/2020       Reactions   Aspirin Other (See Comments)   ulcer   Sulfa Antibiotics Other (See Comments)   unknown        Medication List     TAKE these medications    atorvastatin 40 MG tablet Commonly known as: LIPITOR Take 1 tablet (40 mg total) by mouth daily.   DuoDERM Hydroactive Gel Apply to  the affected area once a day. Must cover with gauze   fenofibrate 145 MG tablet Commonly known as: TRICOR Take 1 tablet (145 mg total) by mouth daily.   ferrous sulfate 325 (65 FE) MG EC tablet Take 1 tablet (325 mg total) by mouth 3 (three) times a week. Start taking on: October 31, 2020   furosemide 20 MG tablet Commonly known as: LASIX 20 MG FOR 3 DAYS THEN ONE EVERY OTHER DAY   HYDROcodone-acetaminophen 5-325 MG tablet Commonly known as: NORCO/VICODIN Take 1-2 tablets by mouth every 6 (six) hours as needed  for moderate pain.   levothyroxine 25 MCG tablet Commonly known as: SYNTHROID Take 1 tablet (25 mcg total) by mouth daily before breakfast.   metoprolol tartrate 25 MG tablet Commonly known as: LOPRESSOR Take 0.5 tablets (12.5 mg total) by mouth 2 (two) times daily.   polyethylene glycol 17 g packet Commonly known as: MIRALAX / GLYCOLAX Take 17 g by mouth daily.   warfarin 1 MG tablet Commonly known as: COUMADIN Take 1 tablet (1 mg total) by mouth daily.               Durable Medical Equipment  (From admission, onward)           Start     Ordered   10/29/20 1415  For home use only DME Nebulizer machine  Once       Question Answer Comment  Patient needs a nebulizer to treat with the following condition COPD (chronic obstructive pulmonary disease) ([place])   Length of Need Lifetime      10/29/20 1416   10/29/20 1414  DME Cane  (Discharge Planning)  Once        10/29/20 1416            [place], [employer]. Schedule an appointment as soon as possible for a visit in 1 week(s).   Specialty: Pulmonary Disease Contact information: [place] [address] [phone]                Allergies  Allergen Reactions   Aspirin Other (See Comments)    ulcer   Sulfa Antibiotics Other (See Comments)    unknown      Procedures/Studies: CT FEMUR RIGHT WO CONTRAST  Result Date: 10/28/2020 CLINICAL DATA:  Spontaneous hemorrhage.  Right leg bruising. EXAM: CT OF THE LOWER RIGHT EXTREMITY WITHOUT CONTRAST TECHNIQUE: Multidetector CT imaging of the right lower extremity was performed according to the standard protocol. COMPARISON:  None. FINDINGS: Bones/Joint/Cartilage No acute abnormality. Ligaments Suboptimally assessed by CT. Muscles and Tendons There is intramuscular hematoma primarily within the posterior compartment muscles of the right thigh, most clearly within the semimembranosus and semitendinosus. The largest  confluent area of hematoma measures 13.0 x 5.7 x 6.0 cm. Soft tissues There is diffuse subcutaneous edema of the right thigh and leg. IMPRESSION: 1. Intramuscular hematoma primarily within the posterior compartment muscles of the right thigh, most clearly within the semimembranosus and semitendinosus. The largest confluent area of hematoma measures 13.0 x 5.7 x 6.0 cm. 2. Diffuse subcutaneous edema of the right thigh and leg. Electronically Signed   By: Ulyses Jarred M.D.   On: 10/28/2020 03:12   CT TIBIA FIBULA RIGHT WO CONTRAST  Result Date: 10/28/2020 CLINICAL DATA:  Spontaneous hemorrhage.  Right leg bruising. EXAM: CT OF THE LOWER RIGHT EXTREMITY WITHOUT CONTRAST TECHNIQUE: Multidetector CT imaging of the right lower extremity was performed according to the standard  protocol. COMPARISON:  None. FINDINGS: Bones/Joint/Cartilage No acute abnormality. Ligaments Suboptimally assessed by CT. Muscles and Tendons There is intramuscular hematoma primarily within the posterior compartment muscles of the right thigh, most clearly within the semimembranosus and semitendinosus. The largest confluent area of hematoma measures 13.0 x 5.7 x 6.0 cm. Soft tissues There is diffuse subcutaneous edema of the right thigh and leg. IMPRESSION: 1. Intramuscular hematoma primarily within the posterior compartment muscles of the right thigh, most clearly within the semimembranosus and semitendinosus. The largest confluent area of hematoma measures 13.0 x 5.7 x 6.0 cm. 2. Diffuse subcutaneous edema of the right thigh and leg. Electronically Signed   By: Ulyses Jarred M.D.   On: 10/28/2020 03:12   [place] RT LOWER EXTREM LTD SOFT TISSUE NON VASCULAR  Result Date: 10/05/2020 CLINICAL DATA:  Bruise EXAM: ULTRASOUND right LOWER EXTREMITY LIMITED TECHNIQUE: Ultrasound examination of the lower extremity soft tissues was performed in the area of clinical concern. COMPARISON:  None. FINDINGS: Within the area of concern, there is subcutaneous  soft tissue swelling. There is no focal fluid collection. There is no cystic or solid mass. IMPRESSION: Subcutaneous soft tissue swelling underlying the area of concern along the right thigh. No focal fluid collection. Electronically Signed   By: Maurine Simmering M.D.   On: 10/05/2020 15:30      Subjective: No increase in pain, no numbness or pain of the distal right leg.  No confusion, fever, vomiting.  Discharge Exam: Vitals:   10/30/20 0454 10/30/20 0836  BP: (!) 153/79 134/66  Pulse: 70 79  Resp: 18 18  Temp: 99 F (37.2 C) 98.9 F (37.2 C)  SpO2: 100% 99%   Vitals:   10/29/20 1627 10/29/20 1938 10/30/20 0454 10/30/20 0836  BP: (!) 123/47 108/67 (!) 153/79 134/66  Pulse: 71 83 70 79  Resp: 18 18 18 18   Temp: 98.2 F (36.8 C) 98.6 F (37 C) 99 F (37.2 C) 98.9 F (37.2 C)  TempSrc: Oral Oral    SpO2: 99% 100% 100% 99%  Weight:      Height:        General: Pt is alert, awake, not in acute distress Cardiovascular: RRR, nl S1-S2, no murmurs appreciated.   No LE edema.   Respiratory: Normal respiratory rate and rhythm.  CTAB without rales or wheezes. MSK: The right inner thigh has some mild bruising, with moderate tenderness to palpation.  Not tense.  Neurovascularly intact distally in the right leg. Abdominal: Abdomen soft and non-tender.  No distension or HSM.   Neuro/Psych: Chronic left hemiparesis noted.  No confusion.  Judmgnet and insight appear normal.      The results of significant diagnostics from this hospitalization (including imaging, microbiology, ancillary and laboratory) are listed below for reference.     Microbiology: Recent Results (from the past 240 hour(s))  Resp Panel by RT-PCR (Flu A&B, Covid) Nasopharyngeal Swab     Status: None   Collection Time: 10/27/20  7:02 PM   Specimen: Nasopharyngeal Swab; Nasopharyngeal(NP) swabs in vial transport medium  Result Value Ref Range Status   SARS Coronavirus 2 by RT PCR NEGATIVE NEGATIVE Final    Comment:  (NOTE) SARS-CoV-2 target nucleic acids are NOT DETECTED.  The SARS-CoV-2 RNA is generally detectable in upper respiratory specimens during the acute phase of infection. The lowest concentration of SARS-CoV-2 viral copies this assay can detect is 138 copies/mL. A negative result does not preclude SARS-Cov-2 infection and should not be used as the sole basis for  treatment or other patient management decisions. A negative result may occur with  improper specimen collection/handling, submission of specimen other than nasopharyngeal swab, presence of viral mutation(s) within the areas targeted by this assay, and inadequate number of viral copies(<138 copies/mL). A negative result must be combined with clinical observations, patient history, and epidemiological information. The expected result is Negative.  Fact Sheet for Patients:  EntrepreneurPulse.com.au  Fact Sheet for Healthcare Providers:  IncredibleEmployment.be  This test is no t yet approved or cleared by the [place] FDA and  has been authorized for detection and/or diagnosis of SARS-CoV-2 by FDA under an Emergency Use Authorization (EUA). This EUA will remain  in effect (meaning this test can be used) for the duration of the COVID-19 declaration under Section 564(b)(1) of the Act, 21 U.S.C.section 360bbb-3(b)(1), unless the authorization is terminated  or revoked sooner.       Influenza A by PCR NEGATIVE NEGATIVE Final   Influenza B by PCR NEGATIVE NEGATIVE Final    Comment: (NOTE) The Xpert Xpress SARS-CoV-2/FLU/RSV plus assay is intended as an aid in the diagnosis of influenza from Nasopharyngeal swab specimens and should not be used as a sole basis for treatment. Nasal washings and aspirates are unacceptable for Xpert Xpress SARS-CoV-2/FLU/RSV testing.  Fact Sheet for Patients: EntrepreneurPulse.com.au  Fact Sheet for Healthcare  Providers: IncredibleEmployment.be  This test is not yet approved or cleared by the [place] FDA and has been authorized for detection and/or diagnosis of SARS-CoV-2 by FDA under an Emergency Use Authorization (EUA). This EUA will remain in effect (meaning this test can be used) for the duration of the COVID-19 declaration under Section 564(b)(1) of the Act, 21 U.S.C. section 360bbb-3(b)(1), unless the authorization is terminated or revoked.  Performed at [facility], [address] [place], [place] [address]      Labs: BNP (last 3 results) No results for input(s): BNP in the last 8760 hours. Basic Metabolic Panel: Recent Labs  Lab 10/27/20 1813 10/28/20 0510 10/30/20 0528  NA 128* 131* 128*  K 3.9 3.6 4.9  CL 98 100 97*  CO2 23 23 25   GLUCOSE 117* 154* 102*  BUN 17 15 20   CREATININE 0.51 0.53 0.56  CALCIUM 7.9* 7.9* 8.2*   Liver Function Tests: Recent Labs  Lab 10/28/20 0510 10/30/20 0528  AST 51* 51*  ALT 20 25  ALKPHOS 61 72  BILITOT 1.9* 1.4*  PROT 5.3* 5.5*  ALBUMIN 2.0* 2.0*   No results for input(s): LIPASE, AMYLASE in the last 168 hours. No results for input(s): AMMONIA in the last 168 hours. CBC: Recent Labs  Lab 10/27/20 1813 10/28/20 0510 10/29/20 0447 10/30/20 0528  WBC 9.5 7.6 6.8 7.5  NEUTROABS 7.5  --   --   --   HGB 5.3* 7.7* 7.9* 8.0*  HCT 17.2* 23.2* 25.1* 25.6*  MCV 97.2 90.6 91.6 94.1  PLT 385 354 378 388   Cardiac Enzymes: No results for input(s): CKTOTAL, CKMB, CKMBINDEX, TROPONINI in the last 168 hours. BNP: Invalid input(s): POCBNP CBG: No results for input(s): GLUCAP in the last 168 hours. D-Dimer No results for input(s): DDIMER in the last 72 hours. Hgb A1c No results for input(s): HGBA1C in the last 72 hours. Lipid Profile No results for input(s): CHOL, HDL, LDLCALC, TRIG, CHOLHDL, LDLDIRECT in the last 72 hours. Thyroid function studies No results for input(s): TSH, T4TOTAL,  T3FREE, THYROIDAB in the last 72 hours.  Invalid input(s): FREET3 Anemia work up No results for input(s): VITAMINB12,  FOLATE, FERRITIN, TIBC, IRON, RETICCTPCT in the last 72 hours. Urinalysis    Component Value Date/Time   COLORURINE YELLOW (A) 10/03/2018 0513   APPEARANCEUR CLEAR (A) 10/03/2018 0513   APPEARANCEUR Clear 09/05/2017 1641   LABSPEC 1.010 10/03/2018 0513   PHURINE 7.0 10/03/2018 0513   GLUCOSEU >=500 (A) 10/03/2018 0513   HGBUR NEGATIVE 10/03/2018 0513   BILIRUBINUR NEGATIVE 10/03/2018 0513   BILIRUBINUR Negative 09/05/2017 1641   KETONESUR NEGATIVE 10/03/2018 0513   PROTEINUR NEGATIVE 10/03/2018 0513   NITRITE NEGATIVE 10/03/2018 0513   LEUKOCYTESUR SMALL (A) 10/03/2018 0513   Sepsis Labs Invalid input(s): PROCALCITONIN,  WBC,  LACTICIDVEN Microbiology Recent Results (from the past 240 hour(s))  Resp Panel by RT-PCR (Flu A&B, Covid) Nasopharyngeal Swab     Status: None   Collection Time: 10/27/20  7:02 PM   Specimen: Nasopharyngeal Swab; Nasopharyngeal(NP) swabs in vial transport medium  Result Value Ref Range Status   SARS Coronavirus 2 by RT PCR NEGATIVE NEGATIVE Final    Comment: (NOTE) SARS-CoV-2 target nucleic acids are NOT DETECTED.  The SARS-CoV-2 RNA is generally detectable in upper respiratory specimens during the acute phase of infection. The lowest concentration of SARS-CoV-2 viral copies this assay can detect is 138 copies/mL. A negative result does not preclude SARS-Cov-2 infection and should not be used as the sole basis for treatment or other patient management decisions. A negative result may occur with  improper specimen collection/handling, submission of specimen other than nasopharyngeal swab, presence of viral mutation(s) within the areas targeted by this assay, and inadequate number of viral copies(<138 copies/mL). A negative result must be combined with clinical observations, patient history, and epidemiological information. The  expected result is Negative.  Fact Sheet for Patients:  EntrepreneurPulse.com.au  Fact Sheet for Healthcare Providers:  IncredibleEmployment.be  This test is no t yet approved or cleared by the [place] FDA and  has been authorized for detection and/or diagnosis of SARS-CoV-2 by FDA under an Emergency Use Authorization (EUA). This EUA will remain  in effect (meaning this test can be used) for the duration of the COVID-19 declaration under Section 564(b)(1) of the Act, 21 U.S.C.section 360bbb-3(b)(1), unless the authorization is terminated  or revoked sooner.       Influenza A by PCR NEGATIVE NEGATIVE Final   Influenza B by PCR NEGATIVE NEGATIVE Final    Comment: (NOTE) The Xpert Xpress SARS-CoV-2/FLU/RSV plus assay is intended as an aid in the diagnosis of influenza from Nasopharyngeal swab specimens and should not be used as a sole basis for treatment. Nasal washings and aspirates are unacceptable for Xpert Xpress SARS-CoV-2/FLU/RSV testing.  Fact Sheet for Patients: EntrepreneurPulse.com.au  Fact Sheet for Healthcare Providers: IncredibleEmployment.be  This test is not yet approved or cleared by the [place] FDA and has been authorized for detection and/or diagnosis of SARS-CoV-2 by FDA under an Emergency Use Authorization (EUA). This EUA will remain in effect (meaning this test can be used) for the duration of the COVID-19 declaration under Section 564(b)(1) of the Act, 21 U.S.C. section 360bbb-3(b)(1), unless the authorization is terminated or revoked.  Performed at [facility], [address] [place],  [address]      Time coordinating discharge: 25 minutes    30 Day Unplanned Readmission Risk Score    Flowsheet Row ED to Hosp-Admission (Current) from 10/27/2020 in [facility]  30 Day Unplanned Readmission Risk Score (%) 14.12  Filed at 10/30/2020 1200       This  score is the patient's risk of an unplanned readmission within 30 days of being discharged (0 -100%). The score is based on dignosis, age, lab data, medications, orders, and past utilization.   Low:  0-14.9   Medium: 15-21.9   High: 22-29.9   Extreme: 30 and above            SIGNED:   Edwin Dada, MD  [facility] 10/30/2020, 3:16 PM

## 2020-10-30 NOTE — TOC Progression Note (Addendum)
Transition of Care [facility]) - Progression Note    Patient Details  Name: Teresa Holmes MRN: [id number] Date of Birth: [birth year]  Transition of [facility]) CM/SW [place], LCSW Phone Number: 10/30/2020, 10:24 AM  Clinical Narrative:  Left voicemail for caregiver. Will see if patient will need EMS transport home or not. Will need to order nebulizer and cane to be delivered to home vs room based on plan.   12:33 pm: Received call back from caregiver. She will be home around 4:00/4:30 and will call CSW when she arrives so EMS transport can be arranged. Address on facesheet is correct. [employer] will call her about the nebulizer machine and cane.  Expected Discharge Plan: [place] Barriers to Discharge: Continued Medical Work up  Expected Discharge Plan and Services Expected Discharge Plan: [place] In-house Referral: Clinical Social Work   Post Acute Care Choice: [place] arrangements for the past 2 months: [place] Expected Discharge Date: 10/30/20               DME Arranged: Kasandra Knudsen, Nebulizer machine DME Agency: [employer] Date DME Agency Contacted: 10/29/20 Time DME Agency Contacted: [phone] Representative spoke with at DME Agency: [place]: PT [place]: [place] ([place]) Date [place]: 10/29/20 Time [place]: 70 Representative spoke with at [place]: [place] ([place]) Interventions    Readmission Risk Interventions No flowsheet data found.

## 2020-10-30 NOTE — Progress Notes (Signed)
Patient discharged to private residence. Taken by EMS. RN called caregiver to let her know that patient just left.

## 2020-10-31 ENCOUNTER — Telehealth: Payer: Self-pay

## 2020-10-31 NOTE — Telephone Encounter (Signed)
Left vm with [name] to schedule hospital f/u appointment-[name]

## 2020-11-03 ENCOUNTER — Telehealth: Payer: Self-pay

## 2020-11-03 NOTE — Telephone Encounter (Signed)
Left vm w/ [name] 9/30 & today to schedule hospital f/u-[name]

## 2020-11-03 NOTE — Telephone Encounter (Signed)
error 

## 2020-11-03 NOTE — Telephone Encounter (Signed)
Spoke with pt care giver about inr check with alyssa also to redo is inr on Wednesday due to she did in hospital 4 days go

## 2020-11-05 ENCOUNTER — Ambulatory Visit (INDEPENDENT_AMBULATORY_CARE_PROVIDER_SITE_OTHER): Payer: Medicare Other

## 2020-11-05 DIAGNOSIS — I4891 Unspecified atrial fibrillation: Secondary | ICD-10-CM | POA: Diagnosis not present

## 2020-11-05 DIAGNOSIS — Z7901 Long term (current) use of anticoagulants: Secondary | ICD-10-CM

## 2020-11-05 NOTE — Progress Notes (Signed)
Pt INR 3.7 as per DR Clayborn Bigness skip today and go back to coumadin 1 mg daily and recheck in 1 week

## 2020-11-07 LAB — PROTIME-INR

## 2020-11-12 ENCOUNTER — Other Ambulatory Visit: Payer: Self-pay

## 2020-11-12 ENCOUNTER — Ambulatory Visit (INDEPENDENT_AMBULATORY_CARE_PROVIDER_SITE_OTHER): Payer: Medicare Other | Admitting: Nurse Practitioner

## 2020-11-12 ENCOUNTER — Encounter: Payer: Self-pay | Admitting: Nurse Practitioner

## 2020-11-12 VITALS — BP 178/100 | HR 93 | Temp 98.3°F | Resp 16 | Ht <= 58 in | Wt 95.0 lb

## 2020-11-12 DIAGNOSIS — I10 Essential (primary) hypertension: Secondary | ICD-10-CM | POA: Diagnosis not present

## 2020-11-12 DIAGNOSIS — G894 Chronic pain syndrome: Secondary | ICD-10-CM | POA: Diagnosis not present

## 2020-11-12 DIAGNOSIS — I480 Paroxysmal atrial fibrillation: Secondary | ICD-10-CM

## 2020-11-12 DIAGNOSIS — E871 Hypo-osmolality and hyponatremia: Secondary | ICD-10-CM | POA: Diagnosis not present

## 2020-11-12 DIAGNOSIS — D508 Other iron deficiency anemias: Secondary | ICD-10-CM

## 2020-11-12 MED ORDER — AMLODIPINE BESYLATE 5 MG PO TABS: 5.0000 mg | ORAL_TABLET | Freq: Every day | ORAL | 0 refills | Status: DC

## 2020-11-12 MED ORDER — HYDROCODONE-ACETAMINOPHEN 5-325 MG PO TABS: 1.0000 | ORAL_TABLET | Freq: Four times a day (QID) | ORAL | 0 refills | Status: DC | PRN

## 2020-11-12 NOTE — Progress Notes (Signed)
[facility] [address], [place] [address] [facility] Discharge Acute Issues Care Follow Up                                                                        Patient Demographics  Teresa Holmes, is a 70 y.o. female  DOB 02-03-1950  MRN [id number].  Primary MD  Lavera Guise, MD   Reason for TCC follow Up - Iron deficiency anemia   Past Medical History:  Diagnosis Date   Aneurysm ([place])    Anxiety    Arthritis    Cancer ([place])    CHF (congestive heart failure) ([place])    COPD (chronic obstructive pulmonary disease) ([place])    Hyperlipidemia    Hypertension    Stroke [facility])    Thyroid disease     Past Surgical History:  Procedure Laterality Date   ABDOMINAL HYSTERECTOMY     AORTIC/RENAL BYPASS     bypass graft     CHOLECYSTECTOMY     CORONARY ARTERY BYPASS GRAFT     HIP ARTHROPLASTY Left 11/11/2019   Procedure: ARTHROPLASTY BIPOLAR HIP (HEMIARTHROPLASTY);  Surgeon: Corky Mull, MD;  Location: [facility] ORS;  Service: Orthopedics;  Laterality: Left;       Recent HPI and Hospital Course  Teresa Holmes is a 70 y.o. female with medical history significant of chronic anticoagulation secondary to atrial fibrillation with warfarin, diastolic dysfunction CHF, COPD, essential hypertension, previous CVA, hypothyroidism, osteoarthritis and anxiety disorder who presents to the ER because of concern for bruising of her right leg from the thigh all the way to the calf region.  This has been gradually getting worse over the last 1 week.  Associated with significant pain.  Denied any fever or chills denied any injury.  Patient is on anticoagulation and has not seen any bleeding as well.  Patient noted to have significant drop in her hemoglobin and a huge hematoma in the right thigh to the calf.  Patient is literally bedbound for the most part.  Previous multiple leg  surgeries.  She is being admitted now with symptomatic anemia with significant hematoma and coagulopathy.  Patient was admitted and transfused 1 unit.  Her hemoglobin came up to 8 remained stable.   Spontaneous thigh hematoma The patient was admitted, and CT thigh and US of the right leg showed a thigh hematoma.  This was observed for 48 hours, had no increase in size, no evidence of compartment syndrome or neurovascular compromise, and she had tolerable discomfort without any analgesics.   No intervention necessary, the hematoma has likely tamponaded itself, her anticoagulation was not interrupted and this appears stable.     Continue anticoagulation, follow weekly as an outpatient.    [name] Acute Care Issue to be followed in the Clinic   Supratherapeutic INR Warfarin held, INR dropped to the therapeutic range. Warfarin resumed on discharge.  Check INR with goal 2-3 in the next week.  Paroxysmal atrial fibrillation  Cerebrovascular disease with chronic left hemiparesis   Hypothyroidism    Subjective:   Teresa Holmes today has mild headache. She denies chest pain, abdominal pain, Nausea, any new weakness tingling or numbness. She denies cough, and SOB.    Assessment & Plan   1. Essential (primary) hypertension Blood pressure significantly elevated, amlodipine 5 mg daily added, follow up in 4 weeks - amLODipine (NORVASC) 5 MG tablet; Take 1 tablet (5 mg total) by mouth daily.  Dispense: 90 tablet; Refill: 0  2. Hyponatremia Sodium level 129 in hospital, chronically low per past labs, repeat cmp.  - Comprehensive Metabolic Panel (CMET)  3. Other iron deficiency anemia Reassess current status of anemia, repeat CBC - CBC with Differential/Platelet  4. Paroxysmal atrial fibrillation (HCC) Taking warfarin 1 mg daily.   5. Chronic pain disorder Chronic pain, increased after recent hip fracture, pain medication refilled.  - HYDROcodone-acetaminophen (NORCO/VICODIN)  5-325 MG tablet; Take 1-2 tablets by mouth every 6 (six) hours as needed for moderate pain.  Dispense: 60 tablet; Refill: 0   Reason for frequent admissions/ER visits  Iron deficiency Anemia Supratherapeutic INR Increase risk of subsequent CVA Acute blood loss anemia      Objective:   Vitals:   11/12/20 1401  BP: (!) 178/100  Pulse: 93  Resp: 16  Temp: 98.3 F (36.8 C)  SpO2: 98%  Weight: 95 lb (43.1 kg)  Height: 4\' 9"  (1.448 m)    Wt Readings from Last 3 Encounters:  11/12/20 95 lb (43.1 kg)  10/28/20 95 lb 0.3 oz (43.1 kg)  10/05/20 91 lb (41.3 kg)    Allergies as of 11/12/2020       Reactions   Aspirin Other (See Comments)   ulcer   Sulfa Antibiotics Other (See Comments)   unknown        Medication List        Accurate as of November 12, 2020 11:59 PM. If you have any questions, ask your nurse or doctor.          amLODipine 5 MG tablet Commonly known as: NORVASC Take 1 tablet (5 mg total) by mouth daily. Started by: Jonetta Osgood, NP   atorvastatin 40 MG tablet Commonly known as: LIPITOR Take 1 tablet (40 mg total) by mouth daily.   DuoDERM Hydroactive Gel Apply to the affected area once a day. Must cover with gauze   fenofibrate 145 MG tablet Commonly known as: TRICOR Take 1 tablet (145 mg total) by mouth daily.   ferrous sulfate 325 (65 FE) MG EC tablet Take 1 tablet (325 mg total) by mouth 3 (three) times a week.   furosemide 20 MG tablet Commonly known as: LASIX 20 MG FOR 3 DAYS THEN ONE EVERY OTHER DAY   HYDROcodone-acetaminophen 5-325 MG tablet Commonly known as: NORCO/VICODIN Take 1-2 tablets by mouth every 6 (six) hours as needed for moderate pain.   levothyroxine 25 MCG tablet Commonly known as: SYNTHROID Take 1 tablet (25 mcg total) by mouth daily before breakfast.   metoprolol tartrate 25 MG tablet Commonly known as: LOPRESSOR Take 0.5 tablets (12.5 mg total) by mouth 2 (two) times daily.   polyethylene glycol 17  g packet Commonly known as: MIRALAX / GLYCOLAX Take 17 g by mouth daily.   warfarin 1 MG tablet Commonly known as: COUMADIN Take 1 tablet (1 mg total) by mouth daily.         Physical Exam: Constitutional: Patient appears well-developed and well-nourished. Not in obvious distress. HENT: Normocephalic, atraumatic,  External right and left ear normal. Oropharynx is clear and moist.  Eyes: Conjunctivae and EOM are normal. PERRLA, no scleral icterus. Neck: Normal ROM. Neck supple. No JVD. No tracheal deviation. No thyromegaly. CVS: RRR, S1/S2 +, no murmurs, no gallops, no carotid bruit.  Pulmonary: Effort and breath sounds normal, no stridor, rhonchi, wheezes, rales.  Abdominal: Soft. BS +, no distension, tenderness, rebound or guarding.  Musculoskeletal: Normal range of motion. No edema and no tenderness.  Lymphadenopathy: No lymphadenopathy noted, cervical, inguinal or axillary Neuro: Alert. Normal reflexes, muscle tone coordination. No cranial nerve deficit. Skin: Skin is warm and dry. No rash noted. Not diaphoretic. No erythema. No pallor. Psychiatric: Normal mood and affect. Behavior, judgment, thought content normal.   Data Review   Micro Results No results found for this or any previous visit (from the past 240 hour(s)).   CBC No results for input(s): WBC, HGB, HCT, PLT, MCV, MCH, MCHC, RDW, LYMPHSABS, MONOABS, EOSABS, BASOSABS, BANDABS in the last 168 hours.  Invalid input(s): NEUTRABS, BANDSABD  Chemistries  No results for input(s): NA, K, CL, CO2, GLUCOSE, BUN, CREATININE, CALCIUM, MG, AST, ALT, ALKPHOS, BILITOT in the last 168 hours.  Invalid input(s): GFRCGP ------------------------------------------------------------------------------------------------------------------ estimated creatinine clearance is 39.3 mL/min (by C-G formula based on SCr of 0.56  mg/dL). ------------------------------------------------------------------------------------------------------------------ No results for input(s): HGBA1C in the last 72 hours. ------------------------------------------------------------------------------------------------------------------ No results for input(s): CHOL, HDL, LDLCALC, TRIG, CHOLHDL, LDLDIRECT in the last 72 hours. ------------------------------------------------------------------------------------------------------------------ No results for input(s): TSH, T4TOTAL, T3FREE, THYROIDAB in the last 72 hours.  Invalid input(s): FREET3 ------------------------------------------------------------------------------------------------------------------ No results for input(s): VITAMINB12, FOLATE, FERRITIN, TIBC, IRON, RETICCTPCT in the last 72 hours.  Coagulation profile No results for input(s): INR, PROTIME in the last 168 hours.  No results for input(s): DDIMER in the last 72 hours.  Cardiac Enzymes No results for input(s): CKMB, TROPONINI, MYOGLOBIN in the last 168 hours.  Invalid input(s): CK ------------------------------------------------------------------------------------------------------------------ Invalid input(s): POCBNP   Time Spent in minutes  45 Time spent with patient included reviewing progress notes, labs, imaging studies, and discussing plan for follow up.   Return in about 1 month (around 12/13/2020) for F/U, BP check, Dysen Edmondson PCP.  This patient was seen by Jonetta Osgood, FNP-C in collaboration with Dr. Clayborn Bigness as a part of collaborative care agreement.  Jonetta Osgood, MSN, FNP-C on 11/13/2020 at 7:07 AM   **Disclaimer: This note may have been dictated with voice recognition software. Similar sounding words can inadvertently be transcribed and this note may contain transcription errors which may not have been corrected upon publication of note.**

## 2020-11-13 ENCOUNTER — Ambulatory Visit (INDEPENDENT_AMBULATORY_CARE_PROVIDER_SITE_OTHER): Payer: Medicare Other

## 2020-11-13 DIAGNOSIS — Z86718 Personal history of other venous thrombosis and embolism: Secondary | ICD-10-CM | POA: Diagnosis not present

## 2020-11-13 DIAGNOSIS — I480 Paroxysmal atrial fibrillation: Secondary | ICD-10-CM | POA: Diagnosis not present

## 2020-11-13 NOTE — Progress Notes (Signed)
Home PT/INR was 1.9 patient was informed of directions on 11/12/2020.  Take 2 tablets on wed and 1 tablet on all other  days of the week per [facility]

## 2020-11-14 LAB — PROTIME-INR

## 2020-11-19 ENCOUNTER — Other Ambulatory Visit: Payer: Self-pay | Admitting: Internal Medicine

## 2020-11-19 DIAGNOSIS — Z7901 Long term (current) use of anticoagulants: Secondary | ICD-10-CM | POA: Diagnosis not present

## 2020-11-19 DIAGNOSIS — E782 Mixed hyperlipidemia: Secondary | ICD-10-CM

## 2020-11-20 ENCOUNTER — Other Ambulatory Visit: Payer: Self-pay

## 2020-11-20 ENCOUNTER — Ambulatory Visit (INDEPENDENT_AMBULATORY_CARE_PROVIDER_SITE_OTHER): Payer: Medicare Other

## 2020-11-20 DIAGNOSIS — I480 Paroxysmal atrial fibrillation: Secondary | ICD-10-CM

## 2020-11-21 NOTE — Progress Notes (Signed)
11/20/20 Home PT/INR 1.8.  Per Ander Purpura pt is to continue regular dose Take 2 tablets on wed and 1 tablet on all other  days of the week

## 2020-11-25 LAB — PROTIME-INR

## 2020-11-26 ENCOUNTER — Ambulatory Visit (INDEPENDENT_AMBULATORY_CARE_PROVIDER_SITE_OTHER): Payer: Medicare Other

## 2020-11-26 ENCOUNTER — Other Ambulatory Visit: Payer: Self-pay

## 2020-11-26 DIAGNOSIS — I480 Paroxysmal atrial fibrillation: Secondary | ICD-10-CM | POA: Diagnosis not present

## 2020-11-28 NOTE — Progress Notes (Cosign Needed)
11/26/20 Home PT/INR was 2.0.  per Ander Purpura pt is to continue dosage Take 2 tablets on wed and 1 tablet on all other  days of the week.

## 2020-12-03 ENCOUNTER — Ambulatory Visit (INDEPENDENT_AMBULATORY_CARE_PROVIDER_SITE_OTHER): Payer: Medicare Other

## 2020-12-03 ENCOUNTER — Other Ambulatory Visit: Payer: Self-pay

## 2020-12-03 DIAGNOSIS — I480 Paroxysmal atrial fibrillation: Secondary | ICD-10-CM | POA: Diagnosis not present

## 2020-12-03 DIAGNOSIS — Z7901 Long term (current) use of anticoagulants: Secondary | ICD-10-CM | POA: Diagnosis not present

## 2020-12-03 NOTE — Progress Notes (Signed)
Pts home INR was 1.5. per Alyssa pt is to take warfarin 2 tablets on Wednesdays and Fridays and 1 tablet on the other days (Monday, Tuesday, Thursday, Saturday, Sunday). Pt's care taker Vito Backers is aware pt is to take warfarin like this continuously until she is told differently. She repeated instructions back and had a full understanding on new directions.

## 2020-12-10 ENCOUNTER — Ambulatory Visit: Payer: Medicare Other | Admitting: Nurse Practitioner

## 2020-12-10 DIAGNOSIS — Z0289 Encounter for other administrative examinations: Secondary | ICD-10-CM

## 2020-12-11 ENCOUNTER — Other Ambulatory Visit: Payer: Self-pay

## 2020-12-11 ENCOUNTER — Ambulatory Visit (INDEPENDENT_AMBULATORY_CARE_PROVIDER_SITE_OTHER): Payer: Medicare Other

## 2020-12-11 DIAGNOSIS — I4891 Unspecified atrial fibrillation: Secondary | ICD-10-CM | POA: Diagnosis not present

## 2020-12-11 NOTE — Progress Notes (Signed)
Home INR is 2.0, per Alyssa no change

## 2020-12-16 LAB — PROTIME-INR

## 2020-12-17 ENCOUNTER — Ambulatory Visit (INDEPENDENT_AMBULATORY_CARE_PROVIDER_SITE_OTHER): Payer: Medicare Other

## 2020-12-17 DIAGNOSIS — Z7901 Long term (current) use of anticoagulants: Secondary | ICD-10-CM | POA: Diagnosis not present

## 2020-12-17 DIAGNOSIS — I4891 Unspecified atrial fibrillation: Secondary | ICD-10-CM

## 2020-12-17 NOTE — Progress Notes (Signed)
As per alyssa advised pt caregiver Hold coumadin today and tomorrow and then take 2 mg Friday and then take coumadin 1 mg daily until recheck next week

## 2020-12-29 ENCOUNTER — Other Ambulatory Visit: Payer: Self-pay

## 2020-12-29 ENCOUNTER — Ambulatory Visit (INDEPENDENT_AMBULATORY_CARE_PROVIDER_SITE_OTHER): Payer: Medicare Other

## 2020-12-29 DIAGNOSIS — Z7901 Long term (current) use of anticoagulants: Secondary | ICD-10-CM | POA: Diagnosis not present

## 2020-12-29 DIAGNOSIS — I4891 Unspecified atrial fibrillation: Secondary | ICD-10-CM | POA: Diagnosis not present

## 2020-12-29 MED ORDER — LEVOTHYROXINE SODIUM 25 MCG PO TABS: 25.0000 ug | ORAL_TABLET | Freq: Every day | ORAL | 2 refills | Status: AC

## 2020-12-29 NOTE — Progress Notes (Signed)
Pt  caregiver advised as per alyssa take coumadin 1 mg daily and recheck on Monday

## 2021-01-05 ENCOUNTER — Other Ambulatory Visit: Payer: Self-pay

## 2021-01-05 ENCOUNTER — Ambulatory Visit (INDEPENDENT_AMBULATORY_CARE_PROVIDER_SITE_OTHER): Payer: Medicare Other

## 2021-01-05 DIAGNOSIS — I4891 Unspecified atrial fibrillation: Secondary | ICD-10-CM

## 2021-01-05 DIAGNOSIS — Z7901 Long term (current) use of anticoagulants: Secondary | ICD-10-CM

## 2021-01-05 NOTE — Progress Notes (Signed)
Pt INR 1.9 as per alyssa advised caregiver no change take coumadin 1 mg  daily and recheck in 1 week

## 2021-01-14 ENCOUNTER — Ambulatory Visit (INDEPENDENT_AMBULATORY_CARE_PROVIDER_SITE_OTHER): Payer: Medicare Other

## 2021-01-14 ENCOUNTER — Other Ambulatory Visit: Payer: Self-pay

## 2021-01-14 DIAGNOSIS — I4891 Unspecified atrial fibrillation: Secondary | ICD-10-CM | POA: Diagnosis not present

## 2021-01-14 NOTE — Progress Notes (Signed)
Pt INR 1.8 as per alyssa  no change continue same med as prescribed coumadin 1 mg a daily and recheck in 1 week

## 2021-01-19 ENCOUNTER — Ambulatory Visit (INDEPENDENT_AMBULATORY_CARE_PROVIDER_SITE_OTHER): Payer: Medicare Other

## 2021-01-19 ENCOUNTER — Telehealth: Payer: Self-pay

## 2021-01-19 DIAGNOSIS — Z7901 Long term (current) use of anticoagulants: Secondary | ICD-10-CM | POA: Diagnosis not present

## 2021-01-19 DIAGNOSIS — I480 Paroxysmal atrial fibrillation: Secondary | ICD-10-CM

## 2021-01-19 DIAGNOSIS — I4891 Unspecified atrial fibrillation: Secondary | ICD-10-CM

## 2021-01-19 NOTE — Progress Notes (Signed)
MDINR today was 1.4.  per Alyssa patient is to take 2- 1 mg tablets today and then take 1- 1 mg tablets all other days and recheck in one week..  I spoke to pt's care giver and informed her of the instructions.

## 2021-01-21 NOTE — Telephone Encounter (Signed)
error 

## 2021-01-28 ENCOUNTER — Ambulatory Visit (INDEPENDENT_AMBULATORY_CARE_PROVIDER_SITE_OTHER): Payer: Medicare Other

## 2021-01-28 DIAGNOSIS — I4891 Unspecified atrial fibrillation: Secondary | ICD-10-CM

## 2021-01-28 NOTE — Progress Notes (Signed)
PT INR 1.5 continue coumadin 1 mg daily and recheck in 1 week and also spoke with caregiver continue same med

## 2021-02-03 ENCOUNTER — Ambulatory Visit (INDEPENDENT_AMBULATORY_CARE_PROVIDER_SITE_OTHER): Payer: Medicare Other

## 2021-02-03 DIAGNOSIS — I4891 Unspecified atrial fibrillation: Secondary | ICD-10-CM | POA: Diagnosis not present

## 2021-02-03 DIAGNOSIS — Z79899 Other long term (current) drug therapy: Secondary | ICD-10-CM | POA: Diagnosis not present

## 2021-02-03 DIAGNOSIS — Z7901 Long term (current) use of anticoagulants: Secondary | ICD-10-CM

## 2021-02-03 NOTE — Progress Notes (Signed)
Pt INR 1.3 as per alyssa her caregiver advised take 2 mg coumadin every Wednesday and take 1 mg of coumadin all other day every week and recheck in 1 week

## 2021-02-11 ENCOUNTER — Ambulatory Visit (INDEPENDENT_AMBULATORY_CARE_PROVIDER_SITE_OTHER): Payer: Medicare Other

## 2021-02-11 DIAGNOSIS — I4891 Unspecified atrial fibrillation: Secondary | ICD-10-CM

## 2021-02-11 NOTE — Progress Notes (Signed)
Pt INR 1.2 as per alyssa advised caregiver that take coumadin 2 mg on Wednesday and Friday  and take coumadin 1 mg  all other day  and repeat

## 2021-02-13 ENCOUNTER — Other Ambulatory Visit: Payer: Self-pay | Admitting: Nurse Practitioner

## 2021-02-13 DIAGNOSIS — I10 Essential (primary) hypertension: Secondary | ICD-10-CM

## 2021-02-18 ENCOUNTER — Ambulatory Visit (INDEPENDENT_AMBULATORY_CARE_PROVIDER_SITE_OTHER): Payer: Medicare Other

## 2021-02-18 DIAGNOSIS — Z7901 Long term (current) use of anticoagulants: Secondary | ICD-10-CM | POA: Diagnosis not present

## 2021-02-18 DIAGNOSIS — I4891 Unspecified atrial fibrillation: Secondary | ICD-10-CM | POA: Diagnosis not present

## 2021-02-18 NOTE — Progress Notes (Signed)
Pt INR 1.4 as per alyssa advised caregiver continue  take coumadin 2 mg on Wednesday and Friday  and take coumadin 1 mg  all other day  and repeat in 1 week

## 2021-02-25 ENCOUNTER — Ambulatory Visit (INDEPENDENT_AMBULATORY_CARE_PROVIDER_SITE_OTHER): Payer: Medicare Other

## 2021-02-25 DIAGNOSIS — I4891 Unspecified atrial fibrillation: Secondary | ICD-10-CM | POA: Diagnosis not present

## 2021-02-25 NOTE — Progress Notes (Signed)
Pt INR  1.5 as per alyssa her caregiver advised that take coumadin 2 mg  Monday,Wednesday and Friday and rest of the week take Tuesday,Thursday,Saturday and Sunday coumadin 1 mg and repeat in 1 week

## 2021-03-05 ENCOUNTER — Ambulatory Visit (INDEPENDENT_AMBULATORY_CARE_PROVIDER_SITE_OTHER): Payer: Medicare Other

## 2021-03-05 DIAGNOSIS — I4891 Unspecified atrial fibrillation: Secondary | ICD-10-CM

## 2021-03-05 NOTE — Progress Notes (Signed)
Pt INR  2.1 as per alyssa her caregiver advised that take coumadin 2 mg  Monday,Wednesday and Friday and rest of the week take Tuesday,Thursday,Saturday and Sunday coumadin 1 mg and repeat in 1 week

## 2021-03-10 ENCOUNTER — Other Ambulatory Visit: Payer: Self-pay

## 2021-03-13 ENCOUNTER — Ambulatory Visit (INDEPENDENT_AMBULATORY_CARE_PROVIDER_SITE_OTHER): Payer: Medicare Other

## 2021-03-13 DIAGNOSIS — Z79899 Other long term (current) drug therapy: Secondary | ICD-10-CM

## 2021-03-13 DIAGNOSIS — I4891 Unspecified atrial fibrillation: Secondary | ICD-10-CM | POA: Diagnosis not present

## 2021-03-13 NOTE — Progress Notes (Signed)
Pt INR  2.2 as per alyssa her caregiver advised that take coumadin 2 mg  Monday,Wednesday and Friday and rest of the week take Tuesday,Thursday,Saturday and Sunday coumadin 1 mg and repeat in 1 week

## 2021-03-16 ENCOUNTER — Ambulatory Visit: Payer: Medicare Other | Admitting: Nurse Practitioner

## 2021-03-17 ENCOUNTER — Ambulatory Visit: Payer: Self-pay | Admitting: Nurse Practitioner

## 2021-03-17 ENCOUNTER — Encounter: Payer: Self-pay | Admitting: Nurse Practitioner

## 2021-03-17 DIAGNOSIS — I4891 Unspecified atrial fibrillation: Secondary | ICD-10-CM

## 2021-03-17 LAB — POCT INR: INR: 2.2 (ref 2.0–3.0)

## 2021-03-20 ENCOUNTER — Encounter: Payer: Self-pay | Admitting: Nurse Practitioner

## 2021-03-20 LAB — PROTIME-INR

## 2021-03-25 ENCOUNTER — Other Ambulatory Visit: Payer: Self-pay

## 2021-03-25 ENCOUNTER — Ambulatory Visit (INDEPENDENT_AMBULATORY_CARE_PROVIDER_SITE_OTHER): Payer: Medicare Other

## 2021-03-25 DIAGNOSIS — I4891 Unspecified atrial fibrillation: Secondary | ICD-10-CM

## 2021-03-25 DIAGNOSIS — Z7901 Long term (current) use of anticoagulants: Secondary | ICD-10-CM | POA: Diagnosis not present

## 2021-03-25 NOTE — Progress Notes (Signed)
Pt INR  2.2 as per alyssa her caregiver advised that take coumadin 2 mg  Monday,Wednesday and Friday and rest of the week take Tuesday,Thursday,Saturday and Sunday coumadin 1 mg and repeat in 1 week

## 2021-03-29 ENCOUNTER — Encounter: Payer: Self-pay | Admitting: Nurse Practitioner

## 2021-03-29 NOTE — Progress Notes (Deleted)
Pt INR  2.2 as per Teresa Holmes her caregiver advised that take coumadin 2 mg  Monday,Wednesday and Friday and rest of the week take Tuesday,Thursday,Saturday and Sunday coumadin 1 mg and repeat in 1 week  No change from previous week.

## 2021-03-29 NOTE — Progress Notes (Unsigned)
Pt INR  2.2 as per Teresa Holmes her caregiver advised that take coumadin 2 mg  Monday,Wednesday and Friday and rest of the week take Tuesday,Thursday,Saturday and Sunday coumadin 1 mg and repeat in 1 week

## 2021-03-30 ENCOUNTER — Encounter: Payer: Self-pay | Admitting: Nurse Practitioner

## 2021-03-30 LAB — PROTIME-INR

## 2021-04-02 ENCOUNTER — Encounter: Payer: Self-pay | Admitting: Nurse Practitioner

## 2021-04-02 ENCOUNTER — Ambulatory Visit (INDEPENDENT_AMBULATORY_CARE_PROVIDER_SITE_OTHER): Payer: Medicare Other

## 2021-04-02 ENCOUNTER — Telehealth: Payer: Medicare Other | Admitting: Nurse Practitioner

## 2021-04-02 DIAGNOSIS — I4891 Unspecified atrial fibrillation: Secondary | ICD-10-CM | POA: Diagnosis not present

## 2021-04-02 LAB — PROTIME-INR

## 2021-04-02 NOTE — Progress Notes (Signed)
Pt  INR 2.2 as per no change pt caregiver was advised last week if no change just continue same med  ?

## 2021-04-03 ENCOUNTER — Telehealth: Payer: Self-pay

## 2021-04-03 ENCOUNTER — Ambulatory Visit: Payer: Medicare Other

## 2021-04-03 NOTE — Telephone Encounter (Signed)
Spoke to caregiver and informed her that no changes in coumadin dose  ?

## 2021-04-08 ENCOUNTER — Telehealth (INDEPENDENT_AMBULATORY_CARE_PROVIDER_SITE_OTHER): Payer: Medicare Other | Admitting: Nurse Practitioner

## 2021-04-08 ENCOUNTER — Encounter: Payer: Self-pay | Admitting: Nurse Practitioner

## 2021-04-08 DIAGNOSIS — I10 Essential (primary) hypertension: Secondary | ICD-10-CM | POA: Diagnosis not present

## 2021-04-08 DIAGNOSIS — E782 Mixed hyperlipidemia: Secondary | ICD-10-CM

## 2021-04-08 DIAGNOSIS — I480 Paroxysmal atrial fibrillation: Secondary | ICD-10-CM

## 2021-04-08 DIAGNOSIS — G894 Chronic pain syndrome: Secondary | ICD-10-CM

## 2021-04-08 MED ORDER — METOPROLOL TARTRATE 25 MG PO TABS: 12.5000 mg | ORAL_TABLET | Freq: Two times a day (BID) | ORAL | 5 refills | Status: AC

## 2021-04-08 MED ORDER — TRIAMCINOLONE ACETONIDE 0.5 % EX OINT: 1.0000 "application " | TOPICAL_OINTMENT | Freq: Two times a day (BID) | CUTANEOUS | 2 refills | Status: AC

## 2021-04-08 MED ORDER — FUROSEMIDE 20 MG PO TABS: ORAL_TABLET | ORAL | 3 refills | Status: AC

## 2021-04-08 MED ORDER — ATORVASTATIN CALCIUM 40 MG PO TABS: 40.0000 mg | ORAL_TABLET | Freq: Every day | ORAL | 1 refills | Status: AC

## 2021-04-08 MED ORDER — FENOFIBRATE 145 MG PO TABS: 145.0000 mg | ORAL_TABLET | Freq: Every day | ORAL | 3 refills | Status: AC

## 2021-04-08 MED ORDER — HYDROCODONE-ACETAMINOPHEN 5-325 MG PO TABS: 1.0000 | ORAL_TABLET | Freq: Four times a day (QID) | ORAL | 0 refills | Status: AC | PRN

## 2021-04-08 MED ORDER — DUODERM HYDROACTIVE EX GEL: CUTANEOUS | 0 refills | Status: AC

## 2021-04-08 MED ORDER — WARFARIN SODIUM 1 MG PO TABS: 1.0000 mg | ORAL_TABLET | Freq: Every day | ORAL | 1 refills | Status: AC

## 2021-04-08 MED ORDER — AMLODIPINE BESYLATE 5 MG PO TABS: 5.0000 mg | ORAL_TABLET | Freq: Every day | ORAL | 1 refills | Status: AC

## 2021-04-08 NOTE — Progress Notes (Cosign Needed)
[facility] [place], [place] [address]  Internal MEDICINE  Telephone Visit  Patient Name: Teresa Holmes  [id number]  [id number]  Date of Service: 04/08/2021  I connected with the patient at 4:30 PM by telephone and verified the patients identity using two identifiers.   I discussed the limitations, risks, security and privacy concerns of performing an evaluation and management service by telephone and the availability of in person appointments. I also discussed with the patient that there may be a patient responsible charge related to the service.  The patient expressed understanding and agrees to proceed.    Chief Complaint  Patient presents with   Telephone Assessment    [phone]   Telephone Screen   Hyperlipidemia   Hypertension   COPD   Coagulation Disorder    INR today is 2.2    HPI Teresa Holmes presents for a telehealth virtual visit for routine follow-up.  Patient had her INR checked today and it was 2.2.  There will be no change in her warfarin dosing.  She is currently taking 1 mg of warfarin daily except on Tuesdays and Thursdays when she takes 2 mg.  She is accompanied by her caregiver and this office visit is being done on telehealth because the patient is now bedridden.  She is no longer able to come into the office for visits. She reports that her breathing is good and her caregiver states that her lungs sound clear with auscultation.  He is started cutting back on smoking cigarettes a little bit and the patient reports that he is in good spirits today.  Her blood pressure is stable.  The patient denies any pain and her caregiver reports that he has no bedsores.    Current Medication: Outpatient Encounter Medications as of 04/08/2021  Medication Sig   ferrous sulfate 325 (65 FE) MG EC tablet Take 1 tablet (325 mg total) by mouth 3 (three) times a week.   levothyroxine (SYNTHROID) 25 MCG tablet Take 1 tablet (25 mcg total) by mouth daily before  breakfast.   polyethylene glycol (MIRALAX / GLYCOLAX) 17 g packet Take 17 g by mouth daily.   triamcinolone ointment (KENALOG) 0.5 % Apply 1 application. topically 2 (two) times daily.   [DISCONTINUED] amLODipine (NORVASC) 5 MG tablet TAKE 1 TABLET BY MOUTH ONCE A DAY   [DISCONTINUED] atorvastatin (LIPITOR) 40 MG tablet TAKE 1 TABLET BY MOUTH ONCE A DAY   [DISCONTINUED] fenofibrate (TRICOR) 145 MG tablet TAKE 1 TABLET BY MOUTH ONCE A DAY   [DISCONTINUED] furosemide (LASIX) 20 MG tablet TAKE 1 TABLET BY MOUTH FOR 3 DAYS THEN TAKE 1 TABLET BY MOUTH EVERY OTHER DAY   [DISCONTINUED] Hydroactive Dressings (DUODERM HYDROACTIVE) GEL Apply to the affected area once a day. Must cover with gauze   [DISCONTINUED] HYDROcodone-acetaminophen (NORCO/VICODIN) 5-325 MG tablet Take 1-2 tablets by mouth every 6 (six) hours as needed for moderate pain.   [DISCONTINUED] metoprolol tartrate (LOPRESSOR) 25 MG tablet Take 0.5 tablets (12.5 mg total) by mouth 2 (two) times daily.   [DISCONTINUED] warfarin (COUMADIN) 1 MG tablet Take 1 tablet (1 mg total) by mouth daily.   amLODipine (NORVASC) 5 MG tablet Take 1 tablet (5 mg total) by mouth daily.   atorvastatin (LIPITOR) 40 MG tablet Take 1 tablet (40 mg total) by mouth daily.   fenofibrate (TRICOR) 145 MG tablet Take 1 tablet (145 mg total) by mouth daily.   furosemide (LASIX) 20 MG tablet TAKE 1 TABLET BY MOUTH FOR 3 DAYS  THEN TAKE 1 TABLET BY MOUTH EVERY OTHER DAY   Hydroactive Dressings (DUODERM HYDROACTIVE) GEL Apply to the affected area once a day. Must cover with gauze   HYDROcodone-acetaminophen (NORCO/VICODIN) 5-325 MG tablet Take 1-2 tablets by mouth every 6 (six) hours as needed for moderate pain.   metoprolol tartrate (LOPRESSOR) 25 MG tablet Take 0.5 tablets (12.5 mg total) by mouth 2 (two) times daily.   warfarin (COUMADIN) 1 MG tablet Take 1 tablet (1 mg total) by mouth daily. Except on tuesdays and thursdays: take 2 tablets by mouth daily.   No  facility-administered encounter medications on file as of 04/08/2021.    Surgical History: Past Surgical History:  Procedure Laterality Date   ABDOMINAL HYSTERECTOMY     AORTIC/RENAL BYPASS     bypass graft     CHOLECYSTECTOMY     CORONARY ARTERY BYPASS GRAFT     HIP ARTHROPLASTY Left 11/11/2019   Procedure: ARTHROPLASTY BIPOLAR HIP (HEMIARTHROPLASTY);  Surgeon: Corky Mull, MD;  Location: [facility] ORS;  Service: Orthopedics;  Laterality: Left;    Medical History: Past Medical History:  Diagnosis Date   Aneurysm ([place])    Anxiety    Arthritis    Cancer ([place])    CHF (congestive heart failure) (HCC)    COPD (chronic obstructive pulmonary disease) ([place])    Hyperlipidemia    Hypertension    Stroke ([place])    Thyroid disease     Family History: Family History  Problem Relation Age of Onset   Cancer Mother     Social History   Socioeconomic History   Marital status: Married    Spouse name: Not on file   Number of children: Not on file   Years of education: Not on file   Highest education level: Not on file  Occupational History   Not on file  Tobacco Use   Smoking status: Every Day    Types: Cigarettes   Smokeless tobacco: Never  Vaping Use   Vaping Use: Never used  Substance and Sexual Activity   Alcohol use: No   Drug use: Never   Sexual activity: Not on file  Other Topics Concern   Not on file  Social History Narrative   Not on file   Social Determinants of Health   Financial Resource Strain: Not on file  Food Insecurity: Not on file  Transportation Needs: Not on file  Physical Activity: Not on file  Stress: Not on file  Social Connections: Not on file  Intimate Partner Violence: Not on file      Review of Systems  Constitutional:  Negative for chills, fatigue and unexpected weight change.  HENT:  Negative for congestion, rhinorrhea, sneezing and sore throat.   Eyes:  Negative for redness.  Respiratory: Negative.  Negative for cough, chest  tightness, shortness of breath and wheezing.   Cardiovascular: Negative.  Negative for chest pain and palpitations.  Gastrointestinal:  Negative for abdominal pain, constipation, diarrhea, nausea and vomiting.  Genitourinary: Negative.  Negative for dysuria and frequency.  Musculoskeletal:  Negative for arthralgias, back pain, joint swelling and neck pain.  Skin:  Negative for rash.  Neurological: Negative.  Negative for tremors and numbness.  Hematological:  Negative for adenopathy. Does not bruise/bleed easily.  Psychiatric/Behavioral:  Negative for behavioral problems (Depression), sleep disturbance and suicidal ideas. The patient is not nervous/anxious.    Vital Signs: BP 113/68    Temp (!) 97.1 F (36.2 C)    Resp 16    Ht 4'  9" (1.448 m)    SpO2 94%    BMI 20.56 kg/m    Observation/Objective: The patient is alert and engages in conversation appropriately over the phone along with her caregiver who also helps to answer to questions and provide feedback.  Patient does not sound as though she is in any acute distress over the telephone.    Assessment/Plan: All medication refills were ordered today per patient request 1. Essential (primary) hypertension Patient blood pressure is stable with current medications, no changes at this time. - amLODipine (NORVASC) 5 MG tablet; Take 1 tablet (5 mg total) by mouth daily.  Dispense: 90 tablet; Refill: 1  2. Paroxysmal atrial fibrillation (HCC) Patient is on metoprolol for rate control of her atrial fibrillation, refills ordered.  Her INR is 2.2 today which is within therapeutic range.  She will continue her warfarin at the same dose that she has been on which is 1 mg daily except for Tuesdays and Thursdays when she takes 2 mg and her warfarin prescription has been updated to reflect this.  We will continue to adjust her warfarin dosing accordingly when she gets her INR checked again. - metoprolol tartrate (LOPRESSOR) 25 MG tablet; Take 0.5  tablets (12.5 mg total) by mouth 2 (two) times daily.  Dispense: 60 tablet; Refill: 5 - warfarin (COUMADIN) 1 MG tablet; Take 1 tablet (1 mg total) by mouth daily. Except on tuesdays and thursdays: take 2 tablets by mouth daily.  Dispense: 135 tablet; Refill: 1  3. Mixed hyperlipidemia Currently taking fenofibrate and atorvastatin, continue as prescribed, refills ordered. - fenofibrate (TRICOR) 145 MG tablet; Take 1 tablet (145 mg total) by mouth daily.  Dispense: 90 tablet; Refill: 3 - atorvastatin (LIPITOR) 40 MG tablet; Take 1 tablet (40 mg total) by mouth daily.  Dispense: 90 tablet; Refill: 1  4. Chronic pain disorder Norco refill ordered, patient will continue to take as needed and will call clinic when she needs another refill. - HYDROcodone-acetaminophen (NORCO/VICODIN) 5-325 MG tablet; Take 1-2 tablets by mouth every 6 (six) hours as needed for moderate pain.  Dispense: 60 tablet; Refill: 0   General Counseling: Teresa Holmes verbalizes understanding of the findings of today's phone visit and agrees with plan of treatment. I have discussed any further diagnostic evaluation that may be needed or ordered today. We also reviewed her medications today. she has been encouraged to call the office with any questions or concerns that should arise related to todays visit.  Return in about 3 months (around 07/09/2021) for F/U, Karoline Fleer PCP, may be virtual due to patient's medical condition and physical disability. .   No orders of the defined types were placed in this encounter.   Meds ordered this encounter  Medications   triamcinolone ointment (KENALOG) 0.5 %    Sig: Apply 1 application. topically 2 (two) times daily.    Dispense:  30 g    Refill:  2   HYDROcodone-acetaminophen (NORCO/VICODIN) 5-325 MG tablet    Sig: Take 1-2 tablets by mouth every 6 (six) hours as needed for moderate pain.    Dispense:  60 tablet    Refill:  0   metoprolol tartrate (LOPRESSOR) 25 MG tablet    Sig: Take 0.5  tablets (12.5 mg total) by mouth 2 (two) times daily.    Dispense:  60 tablet    Refill:  5   fenofibrate (TRICOR) 145 MG tablet    Sig: Take 1 tablet (145 mg total) by mouth daily.    Dispense:  90 tablet    Refill:  3   amLODipine (NORVASC) 5 MG tablet    Sig: Take 1 tablet (5 mg total) by mouth daily.    Dispense:  90 tablet    Refill:  1   atorvastatin (LIPITOR) 40 MG tablet    Sig: Take 1 tablet (40 mg total) by mouth daily.    Dispense:  90 tablet    Refill:  1   furosemide (LASIX) 20 MG tablet    Sig: TAKE 1 TABLET BY MOUTH FOR 3 DAYS THEN TAKE 1 TABLET BY MOUTH EVERY OTHER DAY    Dispense:  30 tablet    Refill:  3   Hydroactive Dressings (DUODERM HYDROACTIVE) GEL    Sig: Apply to the affected area once a day. Must cover with gauze    Dispense:  30 g    Refill:  0   warfarin (COUMADIN) 1 MG tablet    Sig: Take 1 tablet (1 mg total) by mouth daily. Except on tuesdays and thursdays: take 2 tablets by mouth daily.    Dispense:  135 tablet    Refill:  1    Time spent:15 Minutes Time spent with patient included reviewing progress notes, labs, imaging studies, and discussing plan for follow up.  [place] Controlled Substance Database was reviewed by me for overdose risk score (ORS) if appropriate.  This patient was seen by Jonetta Osgood, FNP-C in collaboration with Dr. Clayborn Bigness as a part of collaborative care agreement.  Eisa Conaway R. Valetta Fuller, MSN, FNP-C Internal medicine

## 2021-04-13 ENCOUNTER — Ambulatory Visit (INDEPENDENT_AMBULATORY_CARE_PROVIDER_SITE_OTHER): Payer: Medicare Other

## 2021-04-13 DIAGNOSIS — I4891 Unspecified atrial fibrillation: Secondary | ICD-10-CM

## 2021-04-13 NOTE — Progress Notes (Signed)
Pt  INR 2.2 as per Alyssa no change pt caregiver was advised if no change just continue same med  ?

## 2021-04-15 ENCOUNTER — Encounter: Payer: Self-pay | Admitting: Nurse Practitioner

## 2021-04-15 LAB — PROTIME-INR

## 2021-04-20 ENCOUNTER — Encounter: Payer: Self-pay | Admitting: Nurse Practitioner

## 2021-04-20 ENCOUNTER — Ambulatory Visit (INDEPENDENT_AMBULATORY_CARE_PROVIDER_SITE_OTHER): Payer: Medicare Other

## 2021-04-20 DIAGNOSIS — I4891 Unspecified atrial fibrillation: Secondary | ICD-10-CM

## 2021-04-20 LAB — PROTIME-INR

## 2021-04-20 NOTE — Progress Notes (Signed)
Pt INR 2.3 no change continue same med as prescribed and spoke   with caregiver continue take coumadin 2 mg  Monday,Wednesday and Friday and rest of the week take Tuesday,Thursday,Saturday and Sunday coumadin 1 mg and repeat in 1 week ?

## 2021-04-23 ENCOUNTER — Ambulatory Visit (INDEPENDENT_AMBULATORY_CARE_PROVIDER_SITE_OTHER): Payer: Medicare Other

## 2021-04-23 DIAGNOSIS — I4891 Unspecified atrial fibrillation: Secondary | ICD-10-CM

## 2021-04-24 ENCOUNTER — Encounter: Payer: Self-pay | Admitting: Nurse Practitioner

## 2021-04-24 LAB — PROTIME-INR

## 2021-04-24 NOTE — Progress Notes (Signed)
Pt INR 2.2 as per alyssa no change continue take coumadin 2 mg  Monday,Wednesday and Friday and rest of the week take Tuesday,Thursday,Saturday and Sunday coumadin 1 mg and repeat in 1 week ?

## 2021-04-27 ENCOUNTER — Encounter: Payer: Self-pay | Admitting: Nurse Practitioner

## 2021-04-27 LAB — PROTIME-INR

## 2021-04-28 DIAGNOSIS — I499 Cardiac arrhythmia, unspecified: Secondary | ICD-10-CM | POA: Diagnosis not present

## 2021-04-28 DIAGNOSIS — R404 Transient alteration of awareness: Secondary | ICD-10-CM | POA: Diagnosis not present

## 2021-04-28 DIAGNOSIS — R0689 Other abnormalities of breathing: Secondary | ICD-10-CM | POA: Diagnosis not present

## 2021-04-28 DIAGNOSIS — I469 Cardiac arrest, cause unspecified: Secondary | ICD-10-CM | POA: Diagnosis not present

## 2021-05-02 DIAGNOSIS — 419620001 Death: Secondary | SNOMED CT | POA: Diagnosis not present

## 2021-05-02 DEATH — deceased

## 2021-09-01 ENCOUNTER — Ambulatory Visit: Payer: Medicare Other | Admitting: Nurse Practitioner

## 2022-05-21 IMAGING — CT CT HEAD W/O CM
4 series · 14 of 47 positions shown, 16 images · non-contrast
Comparison: August 14, 2019

CLINICAL DATA: Fall 2 days ago

EXAM:
CT HEAD WITHOUT CONTRAST
TECHNIQUE: Contiguous axial images were obtained from the base of the skull
through the vertex without intravenous contrast.

[Series 2: head bone · axial · 0.42mm/px · z∈[+323,+337]mm · 2 of 75 slices shown]
[im 8/75  bone]
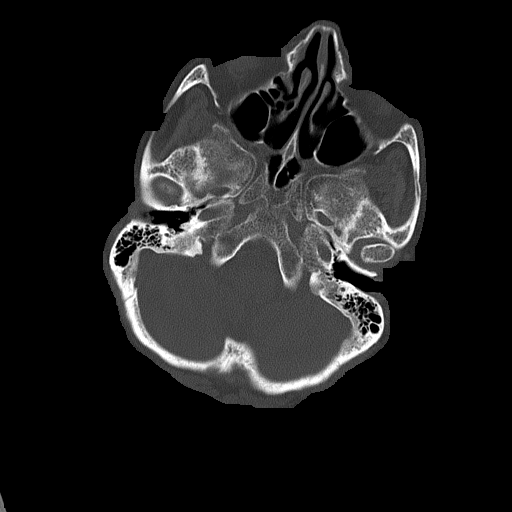
[im 15/75  bone]
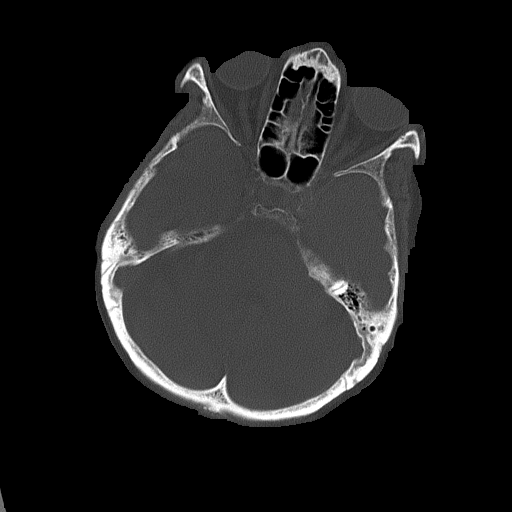

[Series 3: ax head wo · axial · 0.29mm/px · z∈[+353,+446]mm · 6 of 29 slices shown, 8 images]
[im 5/29  brain]
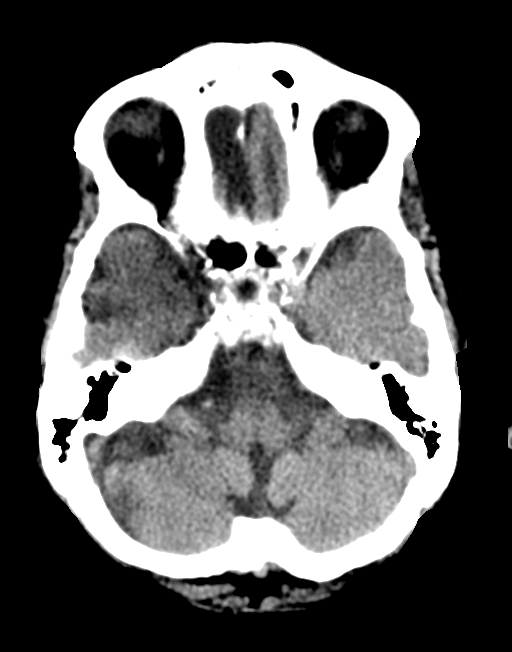
[im 5/29  bone]
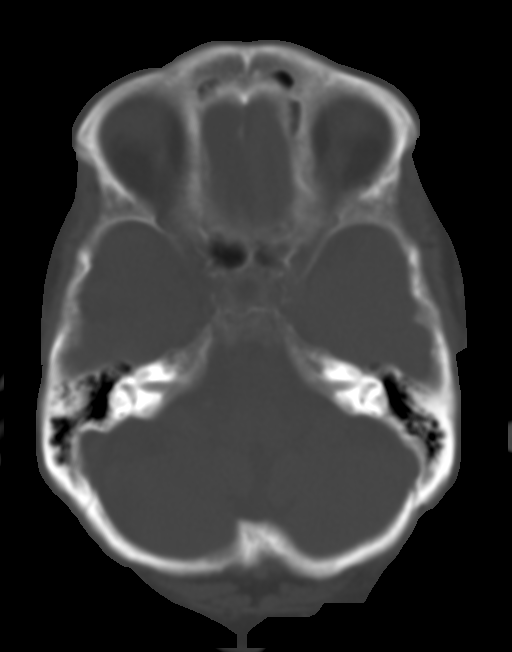
[im 9/29  brain]
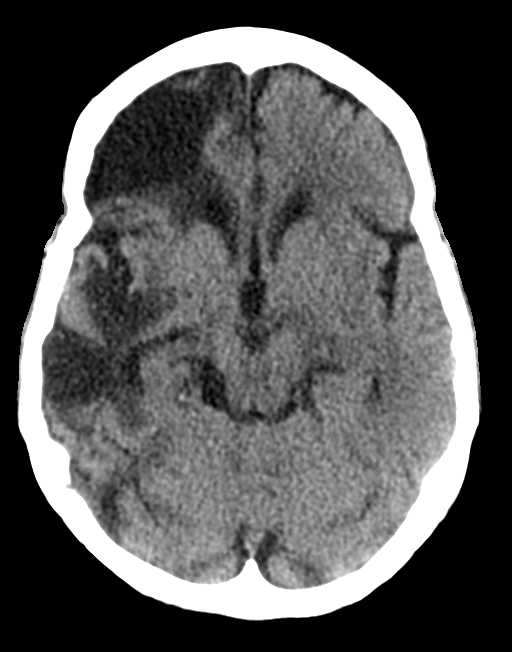
[im 13/29  brain]
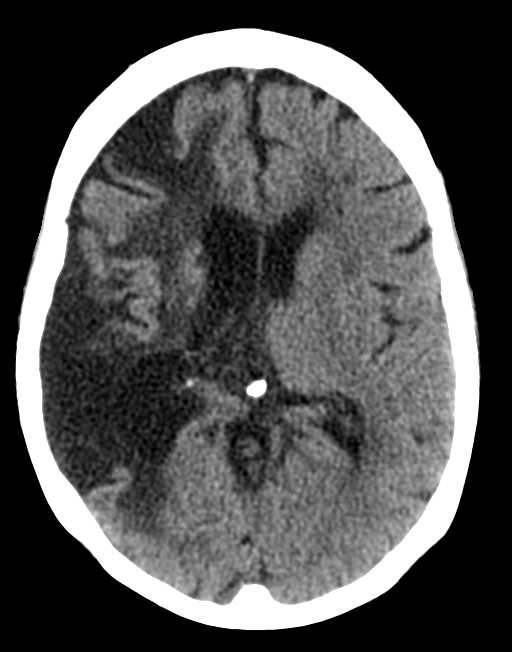
[im 17/29  brain]
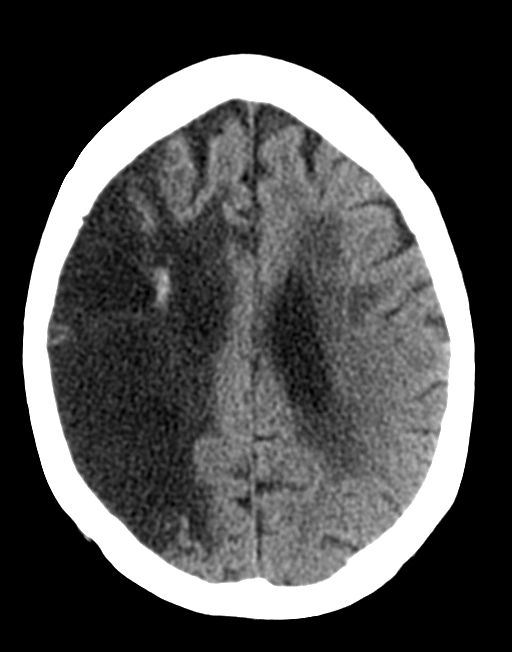
[im 21/29  brain]
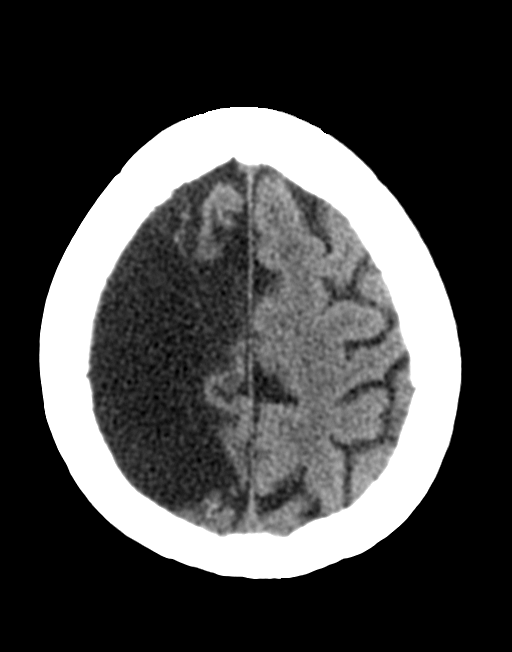
[im 21/29  bone]
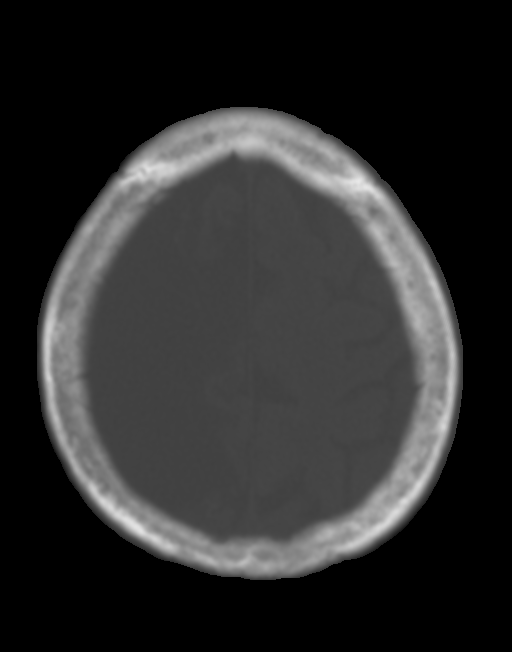
[im 25/29  brain]
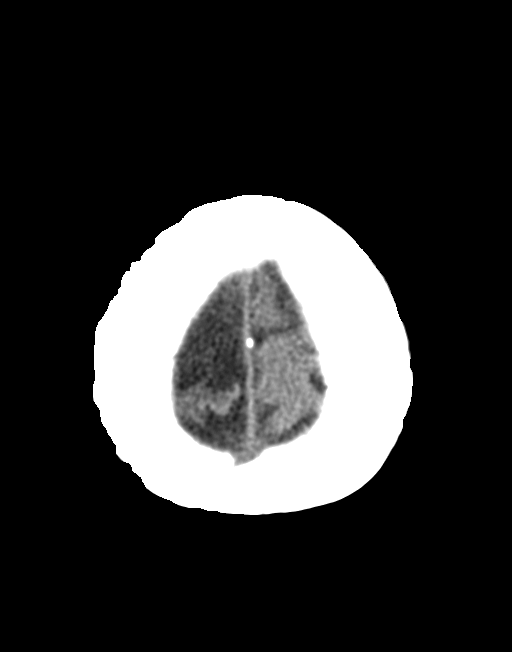

[Series 4: coronal soft tissue · coronal · 0.28mm/px · 3 of 63 slices shown]
[im 21/63  brain]
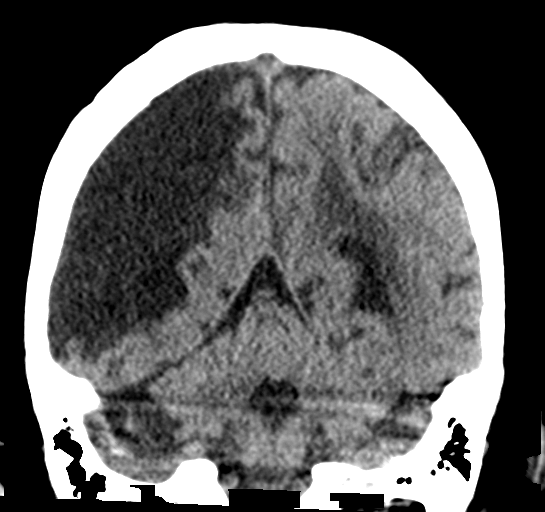
[im 28/63  brain]
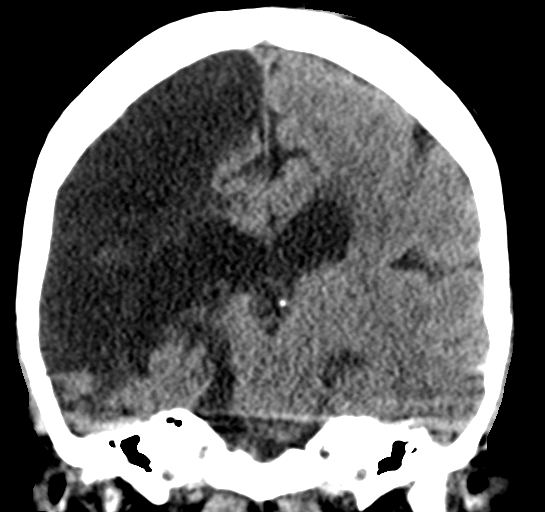
[im 35/63  brain]
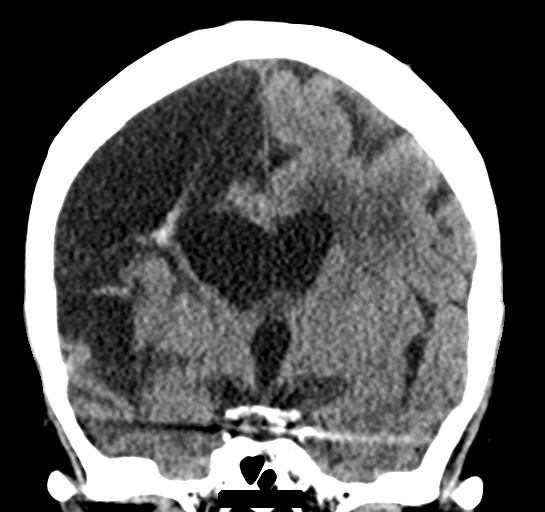

[Series 5: sagittal soft tissue · sagittal · 0.28mm/px · 3 of 51 slices shown]
[im 17/51  brain]
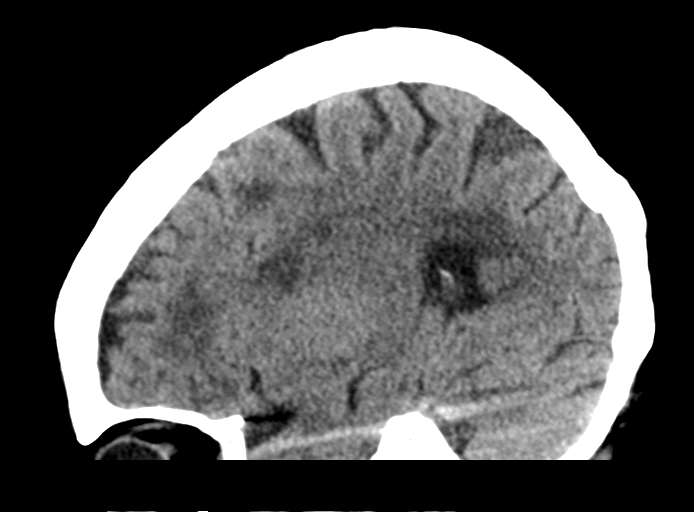
[im 26/51  brain]
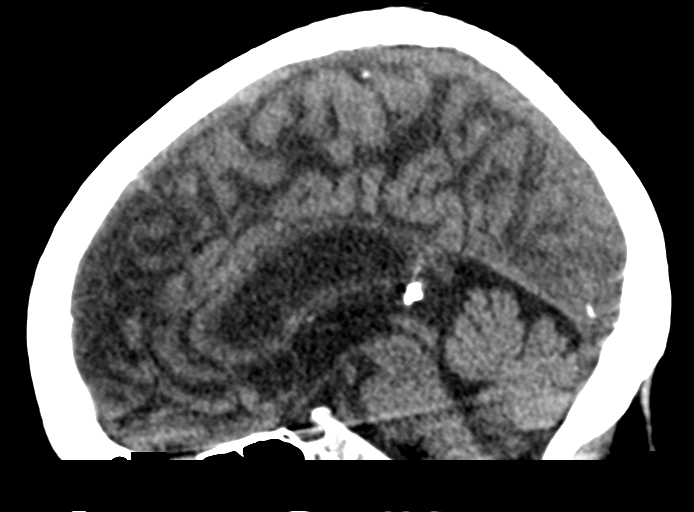
[im 34/51  brain]
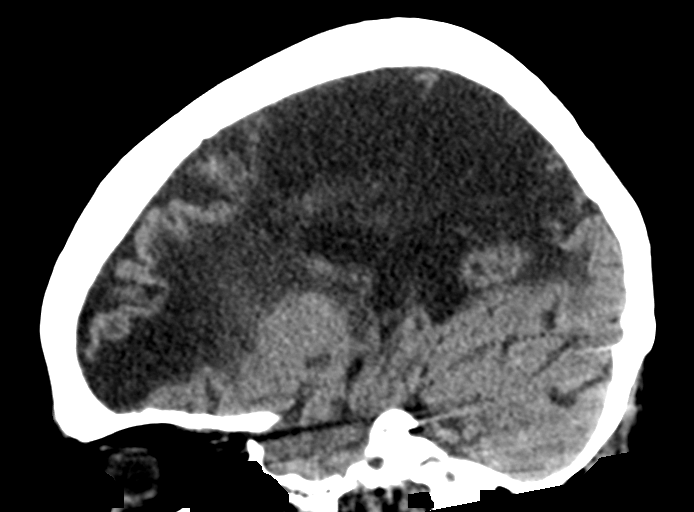

[14 of 47 positions shown; findings below may reference images not displayed]

FINDINGS: Brain: No evidence of acute infarction, hemorrhage, hydrocephalus,
extra-axial collection or mass lesion/mass effect. Sequela of large
RIGHT MCA infarction with encephalomalacia and cortical gliosis
involving the RIGHT temporal and frontoparietal lobes.
Periventricular white matter hypodensities consistent with sequela
of chronic microvascular ischemic disease. Remote RIGHT cerebellar
infarction.

Vascular: No hyperdense vessel or unexpected calcification.

Skull: Normal. Negative for fracture or focal lesion.

Sinuses/Orbits: No acute finding.

Other: None.
IMPRESSION: 1.  No acute intracranial abnormality.

## 2022-05-22 IMAGING — DX DG HIP (WITH OR WITHOUT PELVIS) 2-3V*L*
2 series · 2 of 2 positions shown · non-contrast
Comparison: Radiograph 11/10/2019

CLINICAL DATA: Postop, left hip arthroplasty

EXAM:
DG HIP (WITH OR WITHOUT PELVIS) 2-3V LEFT

[pelvis ap]
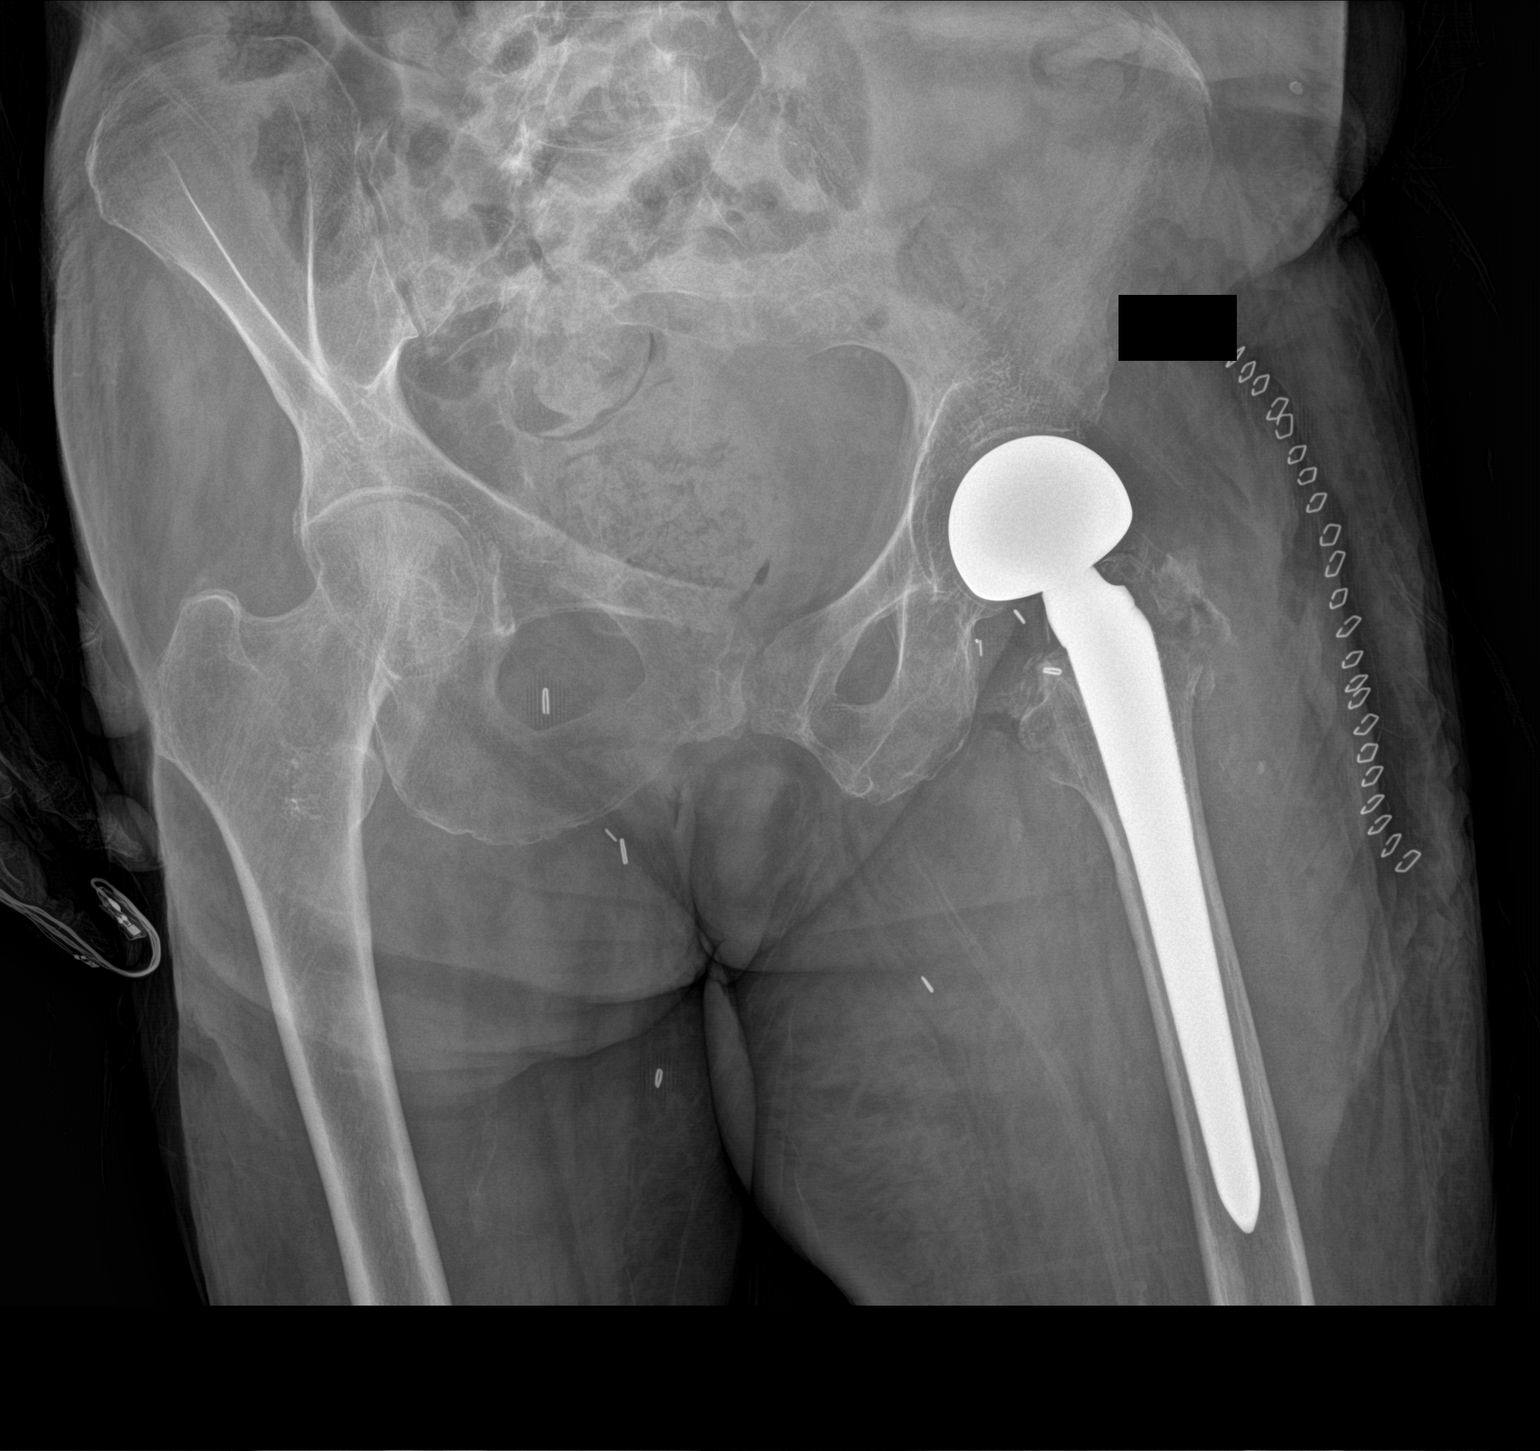

[hip lat]
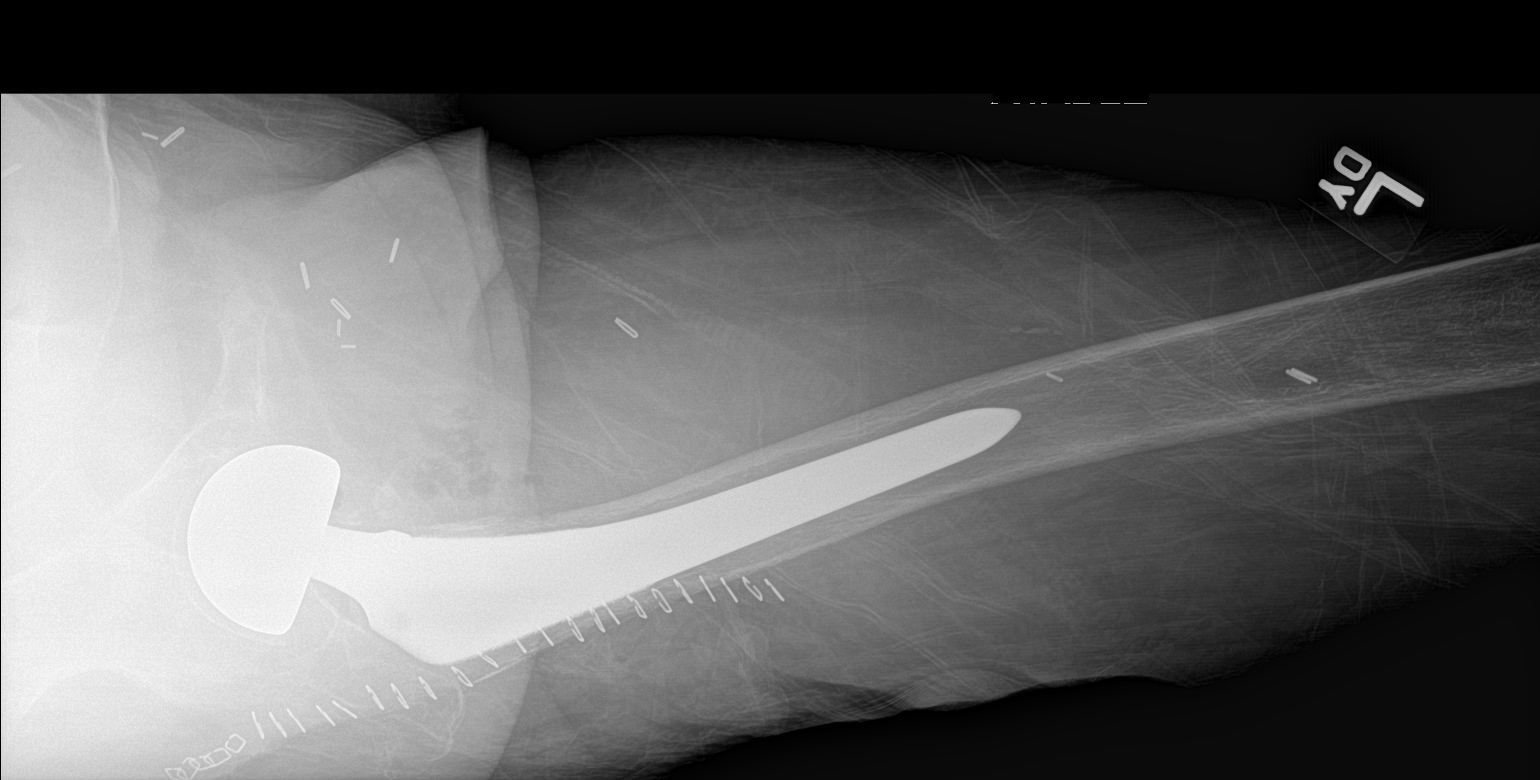

[2 of 2 positions shown; findings below may reference images not displayed]

FINDINGS: Patient appears to be post left hip hemiarthroplasty for the repair
of the previously seen displaced femoral neck fracture. Articulating
femoral component appears normally seated within the acetabulum. Few
adjacent postsurgical ossifications are present. Associated
postsurgical soft tissue changes including soft tissue gas,
intra-articular gas and overlying skin staples are present.
Additional surgical clips noted in the medial thighs, likely related
to prior vascular surgeries. Remaining bones of the pelvis are
intact and congruent. The osseous structures appear diffusely
demineralized which may limit detection of small or nondisplaced
fractures.
IMPRESSION: Status post left hip hemiarthroplasty without evidence of acute
hardware complication.
